# Patient Record
Sex: Male | Born: 1965
Health system: Southern US, Community
[De-identification: ages and names within clinical notes are randomized; demographics above are authoritative.]

## PROBLEM LIST (undated history)

## (undated) DIAGNOSIS — E785 Hyperlipidemia, unspecified: Secondary | ICD-10-CM

## (undated) DIAGNOSIS — E119 Type 2 diabetes mellitus without complications: Secondary | ICD-10-CM

## (undated) DIAGNOSIS — I10 Essential (primary) hypertension: Secondary | ICD-10-CM

## (undated) DIAGNOSIS — E1169 Type 2 diabetes mellitus with other specified complication: Secondary | ICD-10-CM

## (undated) HISTORY — PX: FOOT SURGERY: SHX648

## (undated) HISTORY — PX: COLONOSCOPY: SHX174

## (undated) HISTORY — PX: OTHER SURGICAL HISTORY: SHX169

---

## 1999-10-31 ENCOUNTER — Emergency Department (HOSPITAL_COMMUNITY): Admission: EM | Admit: 1999-10-31 | Discharge: 1999-10-31 | Payer: Self-pay | Admitting: Internal Medicine

## 2004-04-18 ENCOUNTER — Emergency Department (HOSPITAL_COMMUNITY): Admission: EM | Admit: 2004-04-18 | Discharge: 2004-04-18 | Payer: Self-pay | Admitting: Emergency Medicine

## 2004-04-24 ENCOUNTER — Ambulatory Visit: Payer: Self-pay | Admitting: Nurse Practitioner

## 2004-05-09 ENCOUNTER — Ambulatory Visit: Payer: Self-pay | Admitting: Nurse Practitioner

## 2004-05-16 ENCOUNTER — Ambulatory Visit: Payer: Self-pay | Admitting: Nurse Practitioner

## 2004-07-13 ENCOUNTER — Ambulatory Visit: Payer: Self-pay | Admitting: *Deleted

## 2004-11-15 ENCOUNTER — Ambulatory Visit: Payer: Self-pay | Admitting: Nurse Practitioner

## 2005-07-01 ENCOUNTER — Ambulatory Visit: Payer: Self-pay | Admitting: Nurse Practitioner

## 2005-11-04 ENCOUNTER — Ambulatory Visit: Payer: Self-pay | Admitting: Nurse Practitioner

## 2006-01-30 ENCOUNTER — Ambulatory Visit: Payer: Self-pay | Admitting: Nurse Practitioner

## 2006-02-21 ENCOUNTER — Ambulatory Visit: Payer: Self-pay | Admitting: Nurse Practitioner

## 2006-04-03 ENCOUNTER — Ambulatory Visit: Payer: Self-pay | Admitting: Nurse Practitioner

## 2006-04-24 ENCOUNTER — Ambulatory Visit (HOSPITAL_COMMUNITY): Admission: RE | Admit: 2006-04-24 | Discharge: 2006-04-24 | Payer: Self-pay | Admitting: Family Medicine

## 2006-04-24 ENCOUNTER — Encounter (INDEPENDENT_AMBULATORY_CARE_PROVIDER_SITE_OTHER): Payer: Self-pay | Admitting: Cardiology

## 2006-05-27 ENCOUNTER — Ambulatory Visit: Payer: Self-pay | Admitting: Nurse Practitioner

## 2006-10-07 ENCOUNTER — Ambulatory Visit: Payer: Self-pay | Admitting: Internal Medicine

## 2006-10-10 DIAGNOSIS — E669 Obesity, unspecified: Secondary | ICD-10-CM

## 2006-10-13 DIAGNOSIS — E1169 Type 2 diabetes mellitus with other specified complication: Secondary | ICD-10-CM | POA: Insufficient documentation

## 2006-10-13 DIAGNOSIS — I10 Essential (primary) hypertension: Secondary | ICD-10-CM | POA: Insufficient documentation

## 2006-10-13 DIAGNOSIS — E785 Hyperlipidemia, unspecified: Secondary | ICD-10-CM

## 2006-10-13 DIAGNOSIS — E119 Type 2 diabetes mellitus without complications: Secondary | ICD-10-CM | POA: Insufficient documentation

## 2006-11-05 ENCOUNTER — Encounter (INDEPENDENT_AMBULATORY_CARE_PROVIDER_SITE_OTHER): Payer: Self-pay | Admitting: *Deleted

## 2006-11-11 ENCOUNTER — Ambulatory Visit: Payer: Self-pay | Admitting: Internal Medicine

## 2006-11-12 ENCOUNTER — Ambulatory Visit (HOSPITAL_COMMUNITY): Admission: RE | Admit: 2006-11-12 | Discharge: 2006-11-12 | Payer: Self-pay | Admitting: Family Medicine

## 2006-11-14 ENCOUNTER — Ambulatory Visit: Payer: Self-pay | Admitting: Family Medicine

## 2006-12-10 ENCOUNTER — Ambulatory Visit: Payer: Self-pay | Admitting: Family Medicine

## 2007-01-27 ENCOUNTER — Ambulatory Visit: Payer: Self-pay | Admitting: Family Medicine

## 2007-01-27 ENCOUNTER — Encounter (INDEPENDENT_AMBULATORY_CARE_PROVIDER_SITE_OTHER): Payer: Self-pay | Admitting: Nurse Practitioner

## 2007-01-27 LAB — CONVERTED CEMR LAB: GC Probe Amp, Urine: POSITIVE — AB

## 2007-03-11 ENCOUNTER — Ambulatory Visit: Payer: Self-pay | Admitting: Internal Medicine

## 2008-01-11 ENCOUNTER — Encounter (INDEPENDENT_AMBULATORY_CARE_PROVIDER_SITE_OTHER): Payer: Self-pay | Admitting: Internal Medicine

## 2008-01-11 ENCOUNTER — Ambulatory Visit: Payer: Self-pay | Admitting: Internal Medicine

## 2008-01-11 LAB — CONVERTED CEMR LAB
ALT: 18 units/L (ref 0–53)
Alkaline Phosphatase: 37 units/L — ABNORMAL LOW (ref 39–117)
BUN: 10 mg/dL (ref 6–23)
CO2: 22 meq/L (ref 19–32)
Chloride: 99 meq/L (ref 96–112)
Creatinine, Ser: 0.87 mg/dL (ref 0.40–1.50)
Glucose, Bld: 122 mg/dL — ABNORMAL HIGH (ref 70–99)
Sodium: 138 meq/L (ref 135–145)
Total Protein: 7.7 g/dL (ref 6.0–8.3)

## 2008-05-02 ENCOUNTER — Ambulatory Visit: Payer: Self-pay | Admitting: Internal Medicine

## 2008-05-02 ENCOUNTER — Encounter (INDEPENDENT_AMBULATORY_CARE_PROVIDER_SITE_OTHER): Payer: Self-pay | Admitting: Internal Medicine

## 2008-05-02 LAB — CONVERTED CEMR LAB
ALT: 24 units/L (ref 0–53)
BUN: 10 mg/dL (ref 6–23)
CO2: 16 meq/L — ABNORMAL LOW (ref 19–32)
Cholesterol: 252 mg/dL — ABNORMAL HIGH (ref 0–200)
Creatinine, Ser: 1 mg/dL (ref 0.40–1.50)
Microalb, Ur: 13.22 mg/dL — ABNORMAL HIGH (ref 0.00–1.89)
Potassium: 4.7 meq/L (ref 3.5–5.3)
Total Bilirubin: 1.1 mg/dL (ref 0.3–1.2)
Total CHOL/HDL Ratio: 4.4
Total Protein: 7.8 g/dL (ref 6.0–8.3)
Triglycerides: 99 mg/dL (ref <150)
VLDL: 20 mg/dL (ref 0–40)

## 2008-08-01 ENCOUNTER — Ambulatory Visit: Payer: Self-pay | Admitting: Internal Medicine

## 2009-01-24 ENCOUNTER — Encounter (INDEPENDENT_AMBULATORY_CARE_PROVIDER_SITE_OTHER): Payer: Self-pay | Admitting: Internal Medicine

## 2009-01-24 ENCOUNTER — Ambulatory Visit: Payer: Self-pay | Admitting: Family Medicine

## 2009-01-24 LAB — CONVERTED CEMR LAB
AST: 31 units/L (ref 0–37)
Albumin: 4.3 g/dL (ref 3.5–5.2)
CO2: 23 meq/L (ref 19–32)
Calcium: 9.2 mg/dL (ref 8.4–10.5)
Chloride: 107 meq/L (ref 96–112)
Creatinine, Ser: 0.74 mg/dL (ref 0.40–1.50)
HDL: 64 mg/dL (ref >39)
PSA: 0.8 ng/mL (ref 0.10–4.00)
Potassium: 4.1 meq/L (ref 3.5–5.3)
Sodium: 144 meq/L (ref 135–145)
Total CHOL/HDL Ratio: 2.8
VLDL: 7 mg/dL (ref 0–40)

## 2009-11-13 ENCOUNTER — Encounter (INDEPENDENT_AMBULATORY_CARE_PROVIDER_SITE_OTHER): Payer: Self-pay | Admitting: *Deleted

## 2009-11-13 LAB — CONVERTED CEMR LAB
AST: 25 units/L (ref 0–37)
Alkaline Phosphatase: 35 units/L — ABNORMAL LOW (ref 39–117)
CO2: 21 meq/L (ref 19–32)
Chloride: 111 meq/L (ref 96–112)
Cholesterol: 207 mg/dL — ABNORMAL HIGH (ref 0–200)
Creatinine, Ser: 0.74 mg/dL (ref 0.40–1.50)
Glucose, Bld: 137 mg/dL — ABNORMAL HIGH (ref 70–99)
Total Bilirubin: 0.4 mg/dL (ref 0.3–1.2)
Total CHOL/HDL Ratio: 4.3
Total Protein: 7 g/dL (ref 6.0–8.3)

## 2013-11-25 ENCOUNTER — Encounter (HOSPITAL_COMMUNITY): Payer: Self-pay | Admitting: Emergency Medicine

## 2013-11-25 ENCOUNTER — Emergency Department (HOSPITAL_COMMUNITY): Payer: Self-pay

## 2013-11-25 ENCOUNTER — Emergency Department (HOSPITAL_COMMUNITY)
Admission: EM | Admit: 2013-11-25 | Discharge: 2013-11-25 | Disposition: A | Discharge status: Home / Self Care | Payer: Self-pay | Attending: Emergency Medicine | Admitting: Emergency Medicine

## 2013-11-25 ENCOUNTER — Other Ambulatory Visit: Payer: Self-pay | Admitting: Physician Assistant

## 2013-11-25 DIAGNOSIS — R61 Generalized hyperhidrosis: Secondary | ICD-10-CM | POA: Insufficient documentation

## 2013-11-25 DIAGNOSIS — I2089 Other forms of angina pectoris: Secondary | ICD-10-CM | POA: Diagnosis present

## 2013-11-25 DIAGNOSIS — E119 Type 2 diabetes mellitus without complications: Secondary | ICD-10-CM

## 2013-11-25 DIAGNOSIS — Z720 Tobacco use: Secondary | ICD-10-CM | POA: Insufficient documentation

## 2013-11-25 DIAGNOSIS — Z7982 Long term (current) use of aspirin: Secondary | ICD-10-CM | POA: Insufficient documentation

## 2013-11-25 DIAGNOSIS — R0602 Shortness of breath: Secondary | ICD-10-CM | POA: Insufficient documentation

## 2013-11-25 DIAGNOSIS — I208 Other forms of angina pectoris: Secondary | ICD-10-CM

## 2013-11-25 DIAGNOSIS — R079 Chest pain, unspecified: Secondary | ICD-10-CM | POA: Insufficient documentation

## 2013-11-25 DIAGNOSIS — I10 Essential (primary) hypertension: Secondary | ICD-10-CM | POA: Diagnosis present

## 2013-11-25 DIAGNOSIS — L02811 Cutaneous abscess of head [any part, except face]: Secondary | ICD-10-CM | POA: Insufficient documentation

## 2013-11-25 HISTORY — DX: Essential (primary) hypertension: I10

## 2013-11-25 HISTORY — DX: Type 2 diabetes mellitus with other specified complication: E11.69

## 2013-11-25 HISTORY — DX: Type 2 diabetes mellitus with other specified complication: E78.5

## 2013-11-25 HISTORY — DX: Type 2 diabetes mellitus without complications: E11.9

## 2013-11-25 LAB — I-STAT TROPONIN, ED
TROPONIN I, POC: 0 ng/mL (ref 0.00–0.08)
TROPONIN I, POC: 0 ng/mL (ref 0.00–0.08)

## 2013-11-25 LAB — BASIC METABOLIC PANEL
Anion gap: 14 (ref 5–15)
BUN: 7 mg/dL (ref 6–23)
CHLORIDE: 98 meq/L (ref 96–112)
CO2: 24 mEq/L (ref 19–32)
CREATININE: 0.77 mg/dL (ref 0.50–1.35)
Calcium: 9.4 mg/dL (ref 8.4–10.5)
GFR calc Af Amer: >90 mL/min (ref >90)
GFR calc non Af Amer: >90 mL/min (ref >90)
Glucose, Bld: 285 mg/dL — ABNORMAL HIGH (ref 70–99)
POTASSIUM: 4.2 meq/L (ref 3.7–5.3)
SODIUM: 136 meq/L — AB (ref 137–147)

## 2013-11-25 LAB — PROTIME-INR
INR: 1.05 (ref 0.00–1.49)
Prothrombin Time: 13.7 seconds (ref 11.6–15.2)

## 2013-11-25 LAB — HEPATIC FUNCTION PANEL
ALK PHOS: 55 U/L (ref 39–117)
ALT: 14 U/L (ref 0–53)
AST: 18 U/L (ref 0–37)
Albumin: 3.4 g/dL — ABNORMAL LOW (ref 3.5–5.2)
Bilirubin, Direct: <0.2 mg/dL (ref 0.0–0.3)
TOTAL PROTEIN: 8 g/dL (ref 6.0–8.3)
Total Bilirubin: 0.3 mg/dL (ref 0.3–1.2)

## 2013-11-25 LAB — LIPID PANEL
Cholesterol: 243 mg/dL — ABNORMAL HIGH (ref 0–200)
HDL: 34 mg/dL — AB (ref >39)
LDL Cholesterol: 189 mg/dL — ABNORMAL HIGH (ref 0–99)
Total CHOL/HDL Ratio: 7.1 RATIO
Triglycerides: 101 mg/dL (ref <150)
VLDL: 20 mg/dL (ref 0–40)

## 2013-11-25 LAB — CBC
HCT: 46.2 % (ref 39.0–52.0)
HEMOGLOBIN: 16.8 g/dL (ref 13.0–17.0)
MCH: 28.5 pg (ref 26.0–34.0)
MCHC: 36.4 g/dL — ABNORMAL HIGH (ref 30.0–36.0)
MCV: 78.4 fL (ref 78.0–100.0)
Platelets: 223 10*3/uL (ref 150–400)
RBC: 5.89 MIL/uL — ABNORMAL HIGH (ref 4.22–5.81)
RDW: 12.8 % (ref 11.5–15.5)
WBC: 6.4 10*3/uL (ref 4.0–10.5)

## 2013-11-25 LAB — APTT: aPTT: 28 seconds (ref 24–37)

## 2013-11-25 LAB — TSH: TSH: 1.07 u[IU]/mL (ref 0.350–4.500)

## 2013-11-25 LAB — CHOLESTEROL, TOTAL: CHOLESTEROL: 236 mg/dL — AB (ref 0–200)

## 2013-11-25 MED ORDER — METOPROLOL TARTRATE 25 MG PO TABS: 25.0000 mg | ORAL_TABLET | Freq: Two times a day (BID) | ORAL | Status: DC

## 2013-11-25 MED ORDER — NITROGLYCERIN 0.4 MG SL SUBL
0.4000 mg | SUBLINGUAL_TABLET | SUBLINGUAL | Status: DC | PRN
  Administered 2013-11-25: 0.4 mg via SUBLINGUAL
  Filled 2013-11-25 (×2): qty 1

## 2013-11-25 MED ORDER — LIDOCAINE HCL (PF) 1 % IJ SOLN
10.0000 mL | Freq: Once | INTRAMUSCULAR | Status: AC
  Administered 2013-11-25: 10 mL via INTRADERMAL
  Filled 2013-11-25: qty 10

## 2013-11-25 MED ORDER — SULFAMETHOXAZOLE-TMP DS 800-160 MG PO TABS
1.0000 | ORAL_TABLET | Freq: Once | ORAL | Status: AC
  Administered 2013-11-25: 1 via ORAL
  Filled 2013-11-25: qty 1

## 2013-11-25 MED ORDER — NITROGLYCERIN 0.4 MG SL SUBL: 0.4000 mg | SUBLINGUAL_TABLET | SUBLINGUAL | Status: DC | PRN

## 2013-11-25 MED ORDER — CEPHALEXIN 250 MG PO CAPS: 500.0000 mg | ORAL_CAPSULE | Freq: Four times a day (QID) | ORAL | Status: AC

## 2013-11-25 MED ORDER — SULFAMETHOXAZOLE-TRIMETHOPRIM 800-160 MG PO TABS: 1.0000 | ORAL_TABLET | Freq: Two times a day (BID) | ORAL | Status: AC

## 2013-11-25 MED ORDER — LISINOPRIL 5 MG PO TABS: 5.0000 mg | ORAL_TABLET | Freq: Every day | ORAL | Status: DC

## 2013-11-25 MED ORDER — ASPIRIN 325 MG PO TABS
325.0000 mg | ORAL_TABLET | ORAL | Status: AC
  Administered 2013-11-25: 325 mg via ORAL
  Filled 2013-11-25: qty 1

## 2013-11-25 MED ORDER — CEPHALEXIN 250 MG PO CAPS
500.0000 mg | ORAL_CAPSULE | Freq: Once | ORAL | Status: AC
  Administered 2013-11-25: 500 mg via ORAL
  Filled 2013-11-25: qty 2

## 2013-11-25 MED ORDER — ASPIRIN EC 81 MG PO TBEC: 81.0000 mg | DELAYED_RELEASE_TABLET | Freq: Every day | ORAL | Status: DC

## 2013-11-25 NOTE — ED Notes (Signed)
Dr. Kathi Simpers back at the bedside.

## 2013-11-25 NOTE — Consult Note (Signed)
ER CONSULT NOTE   Patient ID: Bruce Cabrera MRN: [id number], DOB/AGE: August 02, 1965 48 y.o. Date of Encounter: 11/25/2013  Primary Physician: No PCP Per Patient Primary Cardiologist: New  Chief Complaint:  Chest pain  HPI: Bruce Cabrera is a 48 y.o. male with no history of CAD. He has multiple cardiac risk factors. At about 11:30 pm yesterday, he had onset of substernal chest pain, that was associated with diaphoresis and SOB. It lasted over an hour, finally resolving. He did not try any medications. He was able to sleep. This am, he had the same symptoms. He was concerned about them and came to the hospital. He was given SL NTG x 1 and ASA 325 mg. His pain was initially a 4/10, but has now resolved. He is currently pain-free.   He also was noted to have 3 scalp abscesses, treated with I&D in the ER.   He has never had symptoms like this before. He does not exercise, but has never had exertional chest pain. He has had no bleeding issues and has had no recent illnesses or injuries. He was diagnosed with diabetes several years ago, and has lost over 100 lbs since then, keeping it off for over 6 years.  Past Medical History  Diagnosis Date  . Diabetes mellitus without complication   . Hypertension   . Hyperlipidemia associated with type 2 diabetes mellitus     based on 2011 profile    Surgical History:  Past Surgical History  Procedure Laterality Date  . None       I have reviewed the patient's current medications. Prior to Admission medications   Medication Sig Start Date End Date Taking? Authorizing Provider  aspirin EC 81 MG tablet Take 81 mg by mouth daily.   Yes Historical Provider, MD   PRN Meds:.nitroGLYCERIN  Allergies: No Known Allergies  History   Social History  . Marital Status: Single    Spouse Name: N/A    Number of Children: N/A  . Years of Education: N/A   Occupational History  . 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 Smoking status:  Current Every Day Smoker -- 0.50 packs/day for 30 years    Types: Cigarettes  . Smokeless tobacco: Never Used  . Alcohol Use: Yes     Comment: 2- tallboys a day  . Drug Use: No  . Sexual Activity: Not on file   Other Topics Concern  . Not on file   Social History Narrative   Lives with roommate    Family History  Problem Relation Age of Onset  . Heart attack Mother 48  . Heart failure Brother 71   Family Status  Relation Status Death Age  . Mother Deceased 50  . Father Deceased     no info  . Brother Alive     Review of Systems:   Full 14-point review of systems otherwise negative except as noted above.  Physical Exam: Blood pressure 142/95, pulse 88, temperature 98.6 F (37 C), temperature source Oral, resp. rate 20, SpO2 99.00%. General: Well developed, well nourished,male in no acute distress. Head: Normocephalic, atraumatic, sclera non-icteric, no xanthomas, nares are without discharge. Dentition: poor Neck: No carotid bruits. JVD not elevated. No thyromegally Lungs: Good expansion bilaterally. without wheezes or rhonchi.  Heart: Regular rate and rhythm with S1 S2.  No S3 or S4.  No murmur, no rubs, or gallops appreciated. Abdomen: Soft, non-tender, non-distended with normoactive bowel  sounds. No hepatomegaly. No rebound/guarding. No obvious abdominal masses. Msk:  Strength and tone appear normal for age. No joint deformities or effusions, no spine or costo-vertebral angle tenderness. Extremities: No clubbing or cyanosis. No edema.  Distal pedal pulses are 2+ in 4 extrem Neuro: Alert and oriented X 3. Moves all extremities spontaneously. No focal deficits noted. Psych:  Responds to questions appropriately with a normal affect. Skin: No rashes noted. Lesions, s/p I&D, hemorrhage controlled.  Labs:   Lab Results  Component Value Date   WBC 6.4 11/25/2013   HGB 16.8 11/25/2013   HCT 46.2 11/25/2013   MCV 78.4 11/25/2013   PLT 223 11/25/2013     Recent Labs Lab  11/25/13 1253  NA 136*  K 4.2  CL 98  CO2 24  BUN 7  CREATININE 0.77  CALCIUM 9.4  GLUCOSE 285*    Recent Labs  11/25/13 1259 11/25/13 1623  TROPIPOC 0.00 0.00   Lab Results  Component Value Date   CHOL 207* 11/13/2009   HDL 48 11/13/2009   LDLCALC 141* 11/13/2009   TRIG 90 11/13/2009   Radiology/Studies: Dg Chest 2 View 11/25/2013   CLINICAL DATA:  Cough.  Left chest pain.  Initial evaluation.  EXAM: CHEST  2 VIEW  COMPARISON:  04/18/2004.  FINDINGS: Mediastinum hilar structures normal. Mild right base subsegmental atelectasis versus infiltrate. No pleural effusion or pneumothorax. Heart size normal. No acute bony abnormality  IMPRESSION: Mild right base subsegmental atelectasis and or infiltrate.   Electronically Signed   By: Marcello Moores  Register   On: 11/25/2013 13:28   ECG: 11/25/2013 Sinus tachycardia Possible Anterolateral infarct , age undetermined Abnormal ECG 30mm/s 21mm/mV 100Hz  8.0 SP2 12SL 241 CID: 61 Referred by: Unconfirmed Vent. rate 101 BPM PR interval 178 ms QRS duration 98 ms QT/QTc 348/451 ms P-R-T axes 53 270 40   ASSESSMENT AND PLAN:  Principal Problem:   Anginal chest pain at rest - Mr. Bruce Cabrera has multiple cardiac risk factors, not well controlled. He has had no medications in over a year. He has no insurance and does not get routine medical care. His symptoms are concerning for unstable anginal pain, and his ECG is abnormal. Fortunately, his troponin is negative so far.  See below  Active Problems:   Essential hypertension   Signed, Rosaria Ferries, PA-C 11/25/2013 4:40 PM Beeper [phone]    I have personally seen and examined this patient with Rosaria Ferries, PA-C. I agree with the assessment and plan as outlined above. My plan is as follows:   1. Chest pain concerning for unstable angina: He has multiple risk factors for CAD including HTN, HLD, DM, obesity, ongoing tobacco abuse and FH of premature CAD. He is high risk for obstructive CAD. His  clinical presentation is c/w unstable angina. We had planned on admission with cath tomorrow but he is refusing to be admitted. He is leaving the ED against medical advice today. His blood sugar is uncontrolled. His BP is uncontrolled. I have had a long discussion regarding his risk of death if he indeed is having unstable angina and does not accept our plan for admission. He is not willing to hear this. He states that he must go home to let his dogs out. His mental status is clear and it is clear that he is capable of making his own medical decisions. Will start Lopressor 25 mg po BID, Lisinopril 5 mg Qdaily and give him NTG to use prn for pain. He is instructed to come back  to the ED if he has recurrence of chest pain. We have placed him on the cath list for an outpatient cath on Monday 11/29/13 with Dr. Claiborne Billings.   2. HTN: BP uncontrolled. Will start Lopressor and Lisinopril as above. He has been out of all meds since [facility] closed.   3. DM: Blood sugar elevated today. He will need to be restarted on his diabetic medications. This can be arranged post cath. He needs to be plugged into a free medical clinic for primary care.   MCALHANY,CHRISTOPHER 11/25/2013\ 5:12 PM

## 2013-11-25 NOTE — ED Notes (Signed)
cardiology at bedside

## 2013-11-25 NOTE — ED Notes (Signed)
Pt states last night he was sitting with friends laughing began having substernal chest pain, described as squeezing, became SOB diaphoretic. Pain lasted 1.5 hours went away on its own and he went to bed. Pt states pain returned this AM when he woke. Similar symptoms.

## 2013-11-25 NOTE — ED Provider Notes (Signed)
CSN: [id number]     Arrival date & time 11/25/13  1216 History   First MD Initiated Contact with Patient 11/25/13 1230     Chief Complaint  Patient presents with  . Chest Pain   Patient is a 48 y.o. male presenting with chest pain. The history is provided by the patient.  Chest Pain Pain location:  L chest Pain quality: pressure   Pain radiates to:  Does not radiate Pain radiates to the back: no   Pain severity:  Mild Onset quality:  Sudden Timing:  Constant Chronicity:  New Context: at rest   Relieved by: resolves spontaneously. Worsened by:  Nothing tried Ineffective treatments:  None tried Associated symptoms: diaphoresis and shortness of breath   Associated symptoms: no abdominal pain, no back pain, no cough, no fever, no headache, no heartburn, no nausea and not vomiting   Risk factors: diabetes mellitus and hypertension    Patient is a 48 year old American male with a history of untreated hypertension diabetes who presents with chest pain. Onset was last night while he was with friends. The pain went away after approximately an hour and a half. Patient reports that his pain recurred this morning while he was at rest. He reports diaphoresis and shortness of breath. Shortness of breath is worse with ambulation and improved with rest. Patient does not have a PCP. Patient states his mom died at age <26 of an MI.  Patient rates his pain currently at 4/10 and it does not radiate. Patient denies eating spicy foods. He also reports 3 large painful bumps on the back of his head that have been there for a few weeks. He denies fevers/chills/night sweats.  Past Medical History  Diagnosis Date  . Diabetes mellitus without complication   . Hypertension    History reviewed. No pertinent past surgical history. History reviewed. No pertinent family history. History  Substance Use Topics  . Smoking status: Current Every Day Smoker  . Smokeless tobacco: Not on file  . Alcohol Use: Yes     Review of Systems  Constitutional: Positive for diaphoresis. Negative for fever and chills.  HENT: Negative for rhinorrhea and sore throat.   Eyes: Negative for visual disturbance.  Respiratory: Positive for shortness of breath. Negative for cough.   Cardiovascular: Positive for chest pain.  Gastrointestinal: Negative for heartburn, nausea, vomiting, abdominal pain, diarrhea and constipation.  Genitourinary: Negative for dysuria and hematuria.  Musculoskeletal: Negative for back pain and neck pain.  Skin: Negative for rash.  Neurological: Negative for syncope and headaches.  Psychiatric/Behavioral: Negative for confusion.  All other systems reviewed and are negative.  Allergies  Review of patient's allergies indicates no known allergies.  Home Medications   Prior to Admission medications   Medication Sig Start Date End Date Taking? Authorizing Provider  aspirin EC 81 MG tablet Take 81 mg by mouth daily.   Yes Historical Provider, MD   BP 123/77  Pulse 105  Temp(Src) 98.6 F (37 C) (Oral)  Resp 18  SpO2 97% Physical Exam  Constitutional: He is oriented to person, place, and time. He appears well-developed and well-nourished. No distress.  HENT:  Head: Normocephalic and atraumatic.    Mouth/Throat: Oropharynx is clear and moist.  3 indurated, tender raised lesions on behind L ear and on posterior scalp  Eyes: EOM are normal.  Neck: Neck supple. No JVD present.  Cardiovascular: Normal rate, regular rhythm, normal heart sounds and intact distal pulses.   Pulmonary/Chest: Effort normal and breath sounds normal.  Abdominal: Soft. He exhibits no distension. There is no tenderness.  Musculoskeletal: Normal range of motion. He exhibits no edema.  Neurological: He is alert and oriented to person, place, and time. No cranial nerve deficit.  Skin: Skin is warm and dry.  Psychiatric: His behavior is normal.    ED Course  INCISION AND DRAINAGE Date/Time: 11/25/2013 2:35  PM Performed by: Gustavus Bryant Authorized by: Gustavus Bryant Consent: Verbal consent obtained. Risks and benefits: risks, benefits and alternatives were discussed Patient identity confirmed: verbally with patient and hospital-assigned identification number Type: abscess Body area: head/neck Location details: scalp Local anesthetic: lidocaine 1% without epinephrine Patient sedated: no Scalpel size: 11 Needle gauge: 18 Drainage: purulent Drainage amount: moderate Wound treatment: wound left open Patient tolerance: Patient tolerated the procedure well with no immediate complications.     Labs Review Labs Reviewed  BASIC METABOLIC PANEL - Abnormal; Notable for the following:    Sodium 136 (*)    Glucose, Bld 285 (*)    All other components within normal limits  CBC - Abnormal; Notable for the following:    RBC 5.89 (*)    MCHC 36.4 (*)    All other components within normal limits  I-STAT TROPOININ, ED    Imaging Review Dg Chest 2 View  11/25/2013   CLINICAL DATA:  Cough.  Left chest pain.  Initial evaluation.  EXAM: CHEST  2 VIEW  COMPARISON:  04/18/2004.  FINDINGS: Mediastinum hilar structures normal. Mild right base subsegmental atelectasis versus infiltrate. No pleural effusion or pneumothorax. Heart size normal. No acute bony abnormality  IMPRESSION: Mild right base subsegmental atelectasis and or infiltrate.   Electronically Signed   By: Marcello Moores  Register   On: 11/25/2013 13:28     EKG Interpretation   Date/Time:  Thursday November 25 2013 12:22:29 EDT Ventricular Rate:  101 PR Interval:  178 QRS Duration: 98 QT Interval:  348 QTC Calculation: 451 R Axis:   -90 Text Interpretation:  Sinus tachycardia Possible Anterolateral infarct ,  age undetermined Abnormal ECG No significant change was found Confirmed by  Debby Freiberg [id number]) on 11/25/2013 1:05:22 PM     MDM   Final diagnoses:  Essential hypertension  Chest pain, unspecified chest pain type  Abscess of  scalp    48 year old [place] American male with hypertension and diabetes presents with a left-sided chest pain with diaphoresis and shortness of breath. Patient is well appearing on exam. His cardiac and pulmonary exams are unremarkable. Patient given sublingual nitroglycerin and aspirin and reports pain relief from 4/10 total 1/10. Troponin is negative. HEART score 3, so he is LOW risk for ACS. CXR unremarkable. Patient also has abscess on his posterior scalp that were I&D'd at the bedside as detailed above. Patient voiced immediate relief. Patient states he is currently chest pain-free. Will consult cardiology for further recommendations. Cardiology has evaluated the patient and feels that a cardiac cath would be indicated. However, the patient would rather be discharged and have an outpatient appointment and had this procedure scheduled at a later date. He states that he has a dog at home and that he needs to return to work to pay his bills. Cardiology has arranged follow up. Risk stratification labs sent (TSH, A1C, cholesterol and lipid panels). Will Rx clinda and bactrim for abscesses. Pt also Rx ASA, lisinopril, lopressor and NTG by Cards. Stable at discharge.   Case discussed with Dr. Colin Rhein.   Gustavus Bryant, MD 11/25/13 [phone]

## 2013-11-25 NOTE — ED Notes (Signed)
Pt transported to xray 

## 2013-11-25 NOTE — Discharge Instructions (Signed)
Coronary Angiogram A coronary angiogram, also called coronary angiography, is an X-ray procedure used to look at the arteries in the heart. In this procedure, a dye (contrast dye) is injected through a long, hollow tube (catheter). The catheter is about the size of a piece of cooked spaghetti and is inserted through your groin, wrist, or arm. The dye is injected into each artery, and X-rays are then taken to show if there is a blockage in the arteries of your heart. LET [facility] CARE PROVIDER KNOW ABOUT:  Any allergies you have, including allergies to shellfish or contrast dye.   All medicines you are taking, including vitamins, herbs, eye drops, creams, and over-the-counter medicines.   Previous problems you or members of your family have had with the use of anesthetics.   Any blood disorders you have.   Previous surgeries you have had.  History of kidney problems or failure.   Other medical conditions you have. RISKS AND COMPLICATIONS  Generally, a coronary angiogram is a safe procedure. However, problems can occur and include:  Allergic reaction to the dye.  Bleeding from the access site or other locations.  Kidney injury, especially in people with impaired kidney function.  Stroke (rare).  Heart attack (rare). BEFORE THE PROCEDURE   Do not eat or drink anything after midnight the night before the procedure or as directed by your health care provider.   Ask your health care provider about changing or stopping your regular medicines. This is especially important if you are taking diabetes medicines or blood thinners. PROCEDURE  You may be given a medicine to help you relax (sedative) before the procedure. This medicine is given through an intravenous (IV) access tube that is inserted into one of your veins.   The area where the catheter will be inserted will be washed and shaved. This is usually done in the groin but may be done in the fold of your arm (near your  elbow) or in the wrist.   A medicine will be given to numb the area where the catheter will be inserted (local anesthetic).   The health care provider will insert the catheter into an artery. The catheter will be guided by using a special type of X-ray (fluoroscopy) of the blood vessel being examined.   A special dye will then be injected into the catheter, and X-rays will be taken. The dye will help to show where any narrowing or blockages are located in the heart arteries.  AFTER THE PROCEDURE   If the procedure is done through the leg, you will be kept in bed lying flat for several hours. You will be instructed to not bend or cross your legs.  The insertion site will be checked frequently.   The pulse in your feet or wrist will be checked frequently.   Additional blood tests, X-rays, and an electrocardiogram may be done.  Document Released: 08/11/2002 Document Revised: 06/21/2013 Document Reviewed: 06/29/2012 [facility] Patient Information 2015 [place], [place]. This information is not intended to replace advice given to you by your health care provider. Make sure you discuss any questions you have with your health care provider.  Abscess An abscess is an infected area that contains a collection of pus and debris.It can occur in almost any part of the body. An abscess is also known as a furuncle or boil. CAUSES  An abscess occurs when tissue gets infected. This can occur from blockage of oil or sweat glands, infection of hair follicles, or a minor  injury to the skin. As the body tries to fight the infection, pus collects in the area and creates pressure under the skin. This pressure causes pain. People with weakened immune systems have difficulty fighting infections and get certain abscesses more often.  SYMPTOMS Usually an abscess develops on the skin and becomes a painful mass that is red, warm, and tender. If the abscess forms under the skin, you may feel a moveable soft area  under the skin. Some abscesses break open (rupture) on their own, but most will continue to get worse without care. The infection can spread deeper into the body and eventually into the bloodstream, causing you to feel ill.  DIAGNOSIS  Your caregiver will take your medical history and perform a physical exam. A sample of fluid may also be taken from the abscess to determine what is causing your infection. TREATMENT  Your caregiver may prescribe antibiotic medicines to fight the infection. However, taking antibiotics alone usually does not cure an abscess. Your caregiver may need to make a small cut (incision) in the abscess to drain the pus. In some cases, gauze is packed into the abscess to reduce pain and to continue draining the area. HOME CARE INSTRUCTIONS   Only take over-the-counter or prescription medicines for pain, discomfort, or fever as directed by your caregiver.  If you were prescribed antibiotics, take them as directed. Finish them even if you start to feel better.  If gauze is used, follow your caregiver's directions for changing the gauze.  To avoid spreading the infection:  Keep your draining abscess covered with a bandage.  Wash your hands well.  Do not share personal care items, towels, or whirlpools with others.  Avoid skin contact with others.  Keep your skin and clothes clean around the abscess.  Keep all follow-up appointments as directed by your caregiver. SEEK MEDICAL CARE IF:   You have increased pain, swelling, redness, fluid drainage, or bleeding.  You have muscle aches, chills, or a general ill feeling.  You have a fever. MAKE SURE YOU:   Understand these instructions.  Will watch your condition.  Will get help right away if you are not doing well or get worse. Document Released: 11/14/2004 Document Revised: 08/06/2011 Document Reviewed: 04/19/2011 [facility] Patient Information 2015 [place], [place]. This information is not intended to replace  advice given to you by your health care provider. Make sure you discuss any questions you have with your health care provider.

## 2013-11-26 LAB — HEMOGLOBIN A1C
Hgb A1c MFr Bld: 12.5 % — ABNORMAL HIGH (ref <5.7)
MEAN PLASMA GLUCOSE: 312 mg/dL — AB (ref <117)

## 2013-11-26 NOTE — ED Provider Notes (Signed)
I saw and evaluated the patient, reviewed the resident's note and I agree with the findings and plan.   EKG Interpretation   Date/Time:  Thursday November 25 2013 12:22:29 EDT Ventricular Rate:  101 PR Interval:  178 QRS Duration: 98 QT Interval:  348 QTC Calculation: 451 R Axis:   -90 Text Interpretation:  Sinus tachycardia Possible Anterolateral infarct ,  age undetermined Abnormal ECG No significant change was found Confirmed by  Debby Freiberg [phone]) on 11/25/2013 1:05:22 PM       Briefly, pt is a 48 y.o. male presenting with chest pain, concerning for possible acs, as well as swelling to occiput.  I performed an examination on the patient including cardiac, pulmonary, and gi systems which were unremarkable.  Abscesses drained by resident.  Pt was placed on bactrim and KEFLEX (not clinda, this was a transcription error).  We spoke with cardiology and were to admit the pt, but he insisted on leaving.  He was dc home in stable condition to fu with cards within 72 hours.       Debby Freiberg, MD 11/26/13 [phone]

## 2013-11-29 ENCOUNTER — Encounter (HOSPITAL_COMMUNITY)
Admission: RE | Disposition: A | Discharge status: Home / Self Care | Payer: Self-pay | Source: Ambulatory Visit | Attending: Cardiovascular Disease

## 2013-11-29 ENCOUNTER — Ambulatory Visit (HOSPITAL_COMMUNITY)
Admission: RE | Admit: 2013-11-29 | Discharge: 2013-11-29 | Disposition: A | Discharge status: Home / Self Care | Payer: Self-pay | Source: Ambulatory Visit | Attending: Cardiovascular Disease | Admitting: Cardiovascular Disease

## 2013-11-29 DIAGNOSIS — E118 Type 2 diabetes mellitus with unspecified complications: Secondary | ICD-10-CM | POA: Insufficient documentation

## 2013-11-29 DIAGNOSIS — I2510 Atherosclerotic heart disease of native coronary artery without angina pectoris: Secondary | ICD-10-CM | POA: Insufficient documentation

## 2013-11-29 DIAGNOSIS — E785 Hyperlipidemia, unspecified: Secondary | ICD-10-CM | POA: Insufficient documentation

## 2013-11-29 DIAGNOSIS — E119 Type 2 diabetes mellitus without complications: Secondary | ICD-10-CM | POA: Insufficient documentation

## 2013-11-29 DIAGNOSIS — Z720 Tobacco use: Secondary | ICD-10-CM | POA: Insufficient documentation

## 2013-11-29 DIAGNOSIS — I208 Other forms of angina pectoris: Secondary | ICD-10-CM

## 2013-11-29 DIAGNOSIS — Z7982 Long term (current) use of aspirin: Secondary | ICD-10-CM | POA: Insufficient documentation

## 2013-11-29 DIAGNOSIS — I10 Essential (primary) hypertension: Secondary | ICD-10-CM | POA: Insufficient documentation

## 2013-11-29 HISTORY — PX: LEFT HEART CATHETERIZATION WITH CORONARY ANGIOGRAM: SHX5451

## 2013-11-29 LAB — GLUCOSE, CAPILLARY
GLUCOSE-CAPILLARY: 306 mg/dL — AB (ref 70–99)
Glucose-Capillary: 236 mg/dL — ABNORMAL HIGH (ref 70–99)

## 2013-11-29 SURGERY — LEFT HEART CATHETERIZATION WITH CORONARY ANGIOGRAM
Anesthesia: LOCAL

## 2013-11-29 MED ORDER — NITROGLYCERIN 1 MG/10 ML FOR IR/CATH LAB
INTRA_ARTERIAL | Status: AC
Start: 2013-11-29 — End: 2013-11-29
  Filled 2013-11-29: qty 10

## 2013-11-29 MED ORDER — VERAPAMIL HCL 2.5 MG/ML IV SOLN
INTRAVENOUS | Status: AC
  Filled 2013-11-29: qty 2

## 2013-11-29 MED ORDER — SODIUM CHLORIDE 0.9 % IV SOLN: INTRAVENOUS | Status: DC

## 2013-11-29 MED ORDER — MIDAZOLAM HCL 2 MG/2ML IJ SOLN
INTRAMUSCULAR | Status: AC
  Filled 2013-11-29: qty 2

## 2013-11-29 MED ORDER — SODIUM CHLORIDE 0.9 % IV SOLN: 250.0000 mL | INTRAVENOUS | Status: DC | PRN

## 2013-11-29 MED ORDER — SODIUM CHLORIDE 0.9 % IJ SOLN: 3.0000 mL | INTRAMUSCULAR | Status: DC | PRN

## 2013-11-29 MED ORDER — SODIUM CHLORIDE 0.9 % IJ SOLN: 3.0000 mL | Freq: Two times a day (BID) | INTRAMUSCULAR | Status: DC

## 2013-11-29 MED ORDER — ASPIRIN 81 MG PO CHEW
324.0000 mg | CHEWABLE_TABLET | ORAL | Status: AC
  Administered 2013-11-29: 324 mg via ORAL

## 2013-11-29 MED ORDER — SODIUM CHLORIDE 0.9 % IV SOLN
INTRAVENOUS | Status: DC
  Administered 2013-11-29: 07:00:00 via INTRAVENOUS

## 2013-11-29 MED ORDER — ONDANSETRON HCL 4 MG/2ML IJ SOLN: 4.0000 mg | Freq: Four times a day (QID) | INTRAMUSCULAR | Status: DC | PRN

## 2013-11-29 MED ORDER — HEPARIN (PORCINE) IN NACL 2-0.9 UNIT/ML-% IJ SOLN
INTRAMUSCULAR | Status: AC
  Filled 2013-11-29: qty 1000

## 2013-11-29 MED ORDER — INSULIN ASPART 100 UNIT/ML ~~LOC~~ SOLN
SUBCUTANEOUS | Status: DC
Start: 2013-11-29 — End: 2013-11-29
  Filled 2013-11-29: qty 1

## 2013-11-29 MED ORDER — INSULIN ASPART 100 UNIT/ML ~~LOC~~ SOLN
0.0000 [IU] | SUBCUTANEOUS | Status: DC
  Administered 2013-11-29: 7 [IU] via SUBCUTANEOUS

## 2013-11-29 MED ORDER — ACETAMINOPHEN 325 MG PO TABS
650.0000 mg | ORAL_TABLET | ORAL | Status: DC | PRN
Start: 2013-11-29 — End: 2013-11-29

## 2013-11-29 MED ORDER — ASPIRIN 81 MG PO CHEW
CHEWABLE_TABLET | ORAL | Status: AC
  Filled 2013-11-29: qty 4

## 2013-11-29 MED ORDER — FENTANYL CITRATE 0.05 MG/ML IJ SOLN
INTRAMUSCULAR | Status: AC
  Filled 2013-11-29: qty 2

## 2013-11-29 MED ORDER — LIDOCAINE HCL (PF) 1 % IJ SOLN
INTRAMUSCULAR | Status: AC
  Filled 2013-11-29: qty 30

## 2013-11-29 NOTE — CV Procedure (Signed)
Bruce Cabrera is a 48 y.o. male   [id number]  [id number] LOCATION:  FACILITY: [facility]  PHYSICIAN: Troy Sine, MD, [facility] [birth year]   DATE OF PROCEDURE:  11/29/2013     CARDIAC CATHETERIZATION    HISTORY: Bruce Cabrera is a 48 year old African American male who recently has developed substernal chest pain associated with diaphoresis and shortness of breath.  He was evaluated by Dr. Julianne Handler.  With the patient's cardiac risk factors  including diabetes mellitus, hypertension, hyperlipidemia, tobacco use and concern for possible unstable angina, definitive cardiac catheterization was recommended.   PROCEDURE:  Left heart catheterization: Coronary angiography, left ventriculography  The patient was brought to the second floor [facility] cath lab in the fasting state. The patient was premedicated with Versed  mg and fentanyl 25 mcg. A right radial approach was utilized after an Allen's test verified adequate circulation. The right radial artery was punctured via the Seldinger technique, and a 6 [place] Glidesheath Slender was inserted without difficulty.  A radial cocktail consisting of Verapamil, IV nitroglycerin, and lidocaine was administered. Weight adjusted heparin was administered. A safety J wire was advanced into the ascending aorta. Diagnostic catheterization was done with 5 [place] TIG 4.0 catheter and a 5French pigtail catheter was used for left ventriculography. A TR radial band was applied for hemostasis. The patient left the catheterization laboratory in stable condition.   HEMODYNAMICS:   Central Aorta: 97/64  Left Ventricle: 97/6  ANGIOGRAPHY:   The left main coronary artery was angiographically normal and bifurcated into the LAD and left circumflex coronary artery.   The ramus intermediate vessel is small caliber normal vessel  The LAD was a large caliber vessel that had mild 10-20% narrowing proximal to the first diagonal branch. There was smooth 30% focal  distal narrowing.  It then, the vessel.  The vessel extended to and wrapped around the LV apex and was otherwise free significant disease.  The left circumflex coronary artery  gave rise to two major bifurcating obtuse marginal branches.  There was mild, smooth 20% ostial narrowing.  The RCA was angiographically normal dominant vessel and  gave rise to a large PDA and PLA vessel.   Left ventriculography revealed normal global LV contractility without focal segmental wall motion abnormalities. There was no evidence for mitral regurgitation.  The estimated ejection fraction is 55%.  There were no focal, segmental wall motion abnormalities   Total contrast used: 90 cc Omnipaque  IMPRESSION:  Mild nonobstructive coronary artery disease with smooth 10-20% proximal LAD narrowing, 30% focal distal LAD stenosis; 20% ostial circumflex stenosis and a normal dominant large right coronary artery.  Normal LV function without focal segmental wall motion analysis.   RECOMMENDATION:  Medical therapy.  Smoking cessation is imperative.   Troy Sine, MD, [facility] 11/29/2013 10:41 AM

## 2013-11-29 NOTE — Interval H&P Note (Signed)
Cath Lab Visit (complete for each Cath Lab visit)  Clinical Evaluation Leading to the Procedure:   ACS: No.  Non-ACS:    Anginal Classification: CCS III  Anti-ischemic medical therapy: Maximal Therapy (2 or more classes of medications)  Non-Invasive Test Results: No non-invasive testing performed  Prior CABG: No previous CABG      History and Physical Interval Note:  11/29/2013 9:44 AM  Bruce Cabrera  has presented today for surgery, with the diagnosis of CP  The various methods of treatment have been discussed with the patient and family. After consideration of risks, benefits and other options for treatment, the patient has consented to  Procedure(s): LEFT HEART CATHETERIZATION WITH CORONARY ANGIOGRAM (N/A) as a surgical intervention .  The patient's history has been reviewed, patient examined, no change in status, stable for surgery.  I have reviewed the patient's chart and labs.  Questions were answered to the patient's satisfaction.     Bruce Cabrera A

## 2013-11-29 NOTE — H&P (View-Only) (Signed)
ER CONSULT NOTE   Patient ID: [name] MRN: [id number], DOB/AGE: 1965/02/23 48 y.o. Date of Encounter: 11/25/2013  Primary Physician: No PCP Per Patient Primary Cardiologist: New  Chief Complaint:  Chest pain  HPI: Bruce Cabrera is a 48 y.o. male with no history of CAD. He has multiple cardiac risk factors. At about 11:30 pm yesterday, he had onset of substernal chest pain, that was associated with diaphoresis and SOB. It lasted over an hour, finally resolving. He did not try any medications. He was able to sleep. This am, he had the same symptoms. He was concerned about them and came to the hospital. He was given SL NTG x 1 and ASA 325 mg. His pain was initially a 4/10, but has now resolved. He is currently pain-free.   He also was noted to have 3 scalp abscesses, treated with I&D in the ER.   He has never had symptoms like this before. He does not exercise, but has never had exertional chest pain. He has had no bleeding issues and has had no recent illnesses or injuries. He was diagnosed with diabetes several years ago, and has lost over 100 lbs since then, keeping it off for over 6 years.  Past Medical History  Diagnosis Date  . Diabetes mellitus without complication   . Hypertension   . Hyperlipidemia associated with type 2 diabetes mellitus     based on 2011 profile    Surgical History:  Past Surgical History  Procedure Laterality Date  . None       I have reviewed the patient's current medications. Prior to Admission medications   Medication Sig Start Date End Date Taking? Authorizing Provider  aspirin EC 81 MG tablet Take 81 mg by mouth daily.   Yes Historical Provider, MD   PRN Meds:.nitroGLYCERIN  Allergies: No Known Allergies  History   Social History  . Marital Status: Single    Spouse Name: N/A    Number of Children: N/A  . Years of Education: N/A   Occupational History  . 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 Smoking status:  Current Every Day Smoker -- 0.50 packs/day for 30 years    Types: Cigarettes  . Smokeless tobacco: Never Used  . Alcohol Use: Yes     Comment: 2- tallboys a day  . Drug Use: No  . Sexual Activity: Not on file   Other Topics Concern  . Not on file   Social History Narrative   Lives with roommate    Family History  Problem Relation Age of Onset  . Heart attack Mother 9  . Heart failure Brother 17   Family Status  Relation Status Death Age  . Mother Deceased 49  . Father Deceased     no info  . Brother Alive     Review of Systems:   Full 14-point review of systems otherwise negative except as noted above.  Physical Exam: Blood pressure 142/95, pulse 88, temperature 98.6 F (37 C), temperature source Oral, resp. rate 20, SpO2 99.00%. General: Well developed, well nourished,male in no acute distress. Head: Normocephalic, atraumatic, sclera non-icteric, no xanthomas, nares are without discharge. Dentition: poor Neck: No carotid bruits. JVD not elevated. No thyromegally Lungs: Good expansion bilaterally. without wheezes or rhonchi.  Heart: Regular rate and rhythm with S1 S2.  No S3 or S4.  No murmur, no rubs, or gallops appreciated. Abdomen: Soft, non-tender, non-distended with normoactive bowel  sounds. No hepatomegaly. No rebound/guarding. No obvious abdominal masses. Msk:  Strength and tone appear normal for age. No joint deformities or effusions, no spine or costo-vertebral angle tenderness. Extremities: No clubbing or cyanosis. No edema.  Distal pedal pulses are 2+ in 4 extrem Neuro: Alert and oriented X 3. Moves all extremities spontaneously. No focal deficits noted. Psych:  Responds to questions appropriately with a normal affect. Skin: No rashes noted. Lesions, s/p I&D, hemorrhage controlled.  Labs:   Lab Results  Component Value Date   WBC 6.4 11/25/2013   HGB 16.8 11/25/2013   HCT 46.2 11/25/2013   MCV 78.4 11/25/2013   PLT 223 11/25/2013     Recent Labs Lab  11/25/13 1253  NA 136*  K 4.2  CL 98  CO2 24  BUN 7  CREATININE 0.77  CALCIUM 9.4  GLUCOSE 285*    Recent Labs  11/25/13 1259 11/25/13 1623  TROPIPOC 0.00 0.00   Lab Results  Component Value Date   CHOL 207* 11/13/2009   HDL 48 11/13/2009   LDLCALC 141* 11/13/2009   TRIG 90 11/13/2009   Radiology/Studies: Dg Chest 2 View 11/25/2013   CLINICAL DATA:  Cough.  Left chest pain.  Initial evaluation.  EXAM: CHEST  2 VIEW  COMPARISON:  04/18/2004.  FINDINGS: Mediastinum hilar structures normal. Mild right base subsegmental atelectasis versus infiltrate. No pleural effusion or pneumothorax. Heart size normal. No acute bony abnormality  IMPRESSION: Mild right base subsegmental atelectasis and or infiltrate.   Electronically Signed   By: Marcello Moores  Register   On: 11/25/2013 13:28   ECG: 11/25/2013 Sinus tachycardia Possible Anterolateral infarct , age undetermined Abnormal ECG 66mm/s 39mm/mV 100Hz  8.0 SP2 12SL 241 CID: 61 Referred by: Unconfirmed Vent. rate 101 BPM PR interval 178 ms QRS duration 98 ms QT/QTc 348/451 ms P-R-T axes 53 270 40   ASSESSMENT AND PLAN:  Principal Problem:   Anginal chest pain at rest - [name] has multiple cardiac risk factors, not well controlled. He has had no medications in over a year. He has no insurance and does not get routine medical care. His symptoms are concerning for unstable anginal pain, and his ECG is abnormal. Fortunately, his troponin is negative so far.  See below  Active Problems:   Essential hypertension   Signed, Rosaria Ferries, PA-C 11/25/2013 4:40 PM Beeper [phone]    I have personally seen and examined this patient with Rosaria Ferries, PA-C. I agree with the assessment and plan as outlined above. My plan is as follows:   1. Chest pain concerning for unstable angina: He has multiple risk factors for CAD including HTN, HLD, DM, obesity, ongoing tobacco abuse and FH of premature CAD. He is high risk for obstructive CAD. His  clinical presentation is c/w unstable angina. We had planned on admission with cath tomorrow but he is refusing to be admitted. He is leaving the ED against medical advice today. His blood sugar is uncontrolled. His BP is uncontrolled. I have had a long discussion regarding his risk of death if he indeed is having unstable angina and does not accept our plan for admission. He is not willing to hear this. He states that he must go home to let his dogs out. His mental status is clear and it is clear that he is capable of making his own medical decisions. Will start Lopressor 25 mg po BID, Lisinopril 5 mg Qdaily and give him NTG to use prn for pain. He is instructed to come back  to the ED if he has recurrence of chest pain. We have placed him on the cath list for an outpatient cath on Monday 11/29/13 with Dr. Claiborne Billings.   2. HTN: BP uncontrolled. Will start Lopressor and Lisinopril as above. He has been out of all meds since [facility] closed.   3. DM: Blood sugar elevated today. He will need to be restarted on his diabetic medications. This can be arranged post cath. He needs to be plugged into a free medical clinic for primary care.   MCALHANY,CHRISTOPHER 11/25/2013\ 5:12 PM

## 2013-11-29 NOTE — Discharge Instructions (Signed)
Radial Site Care °Refer to this sheet in the next few weeks. These instructions provide you with information on caring for yourself after your procedure. Your caregiver may also give you more specific instructions. Your treatment has been planned according to current medical practices, but problems sometimes occur. Call your caregiver if you have any problems or questions after your procedure. °HOME CARE INSTRUCTIONS °· You may shower the day after the procedure. Remove the bandage (dressing) and gently wash the site with plain soap and water. Gently pat the site dry. °· Do not apply powder or lotion to the site. °· Do not submerge the affected site in water for 3 to 5 days. °· Inspect the site at least twice daily. °· Do not flex or bend the affected arm for 24 hours. °· No lifting over 5 pounds (2.3 kg) for 5 days after your procedure. °· Do not drive home if you are discharged the same day of the procedure. Have someone else drive you. °· You may drive 24 hours after the procedure unless otherwise instructed by your caregiver. °· Do not operate machinery or power tools for 24 hours. °· A responsible adult should be with you for the first 24 hours after you arrive home. °What to expect: °· Any bruising will usually fade within 1 to 2 weeks. °· Blood that collects in the tissue (hematoma) may be painful to the touch. It should usually decrease in size and tenderness within 1 to 2 weeks. °SEEK IMMEDIATE MEDICAL CARE IF: °· You have unusual pain at the radial site. °· You have redness, warmth, swelling, or pain at the radial site. °· You have drainage (other than a small amount of blood on the dressing). °· You have chills. °· You have a fever or persistent symptoms for more than 72 hours. °· You have a fever and your symptoms suddenly get worse. °· Your arm becomes pale, cool, tingly, or numb. °· You have heavy bleeding from the site. Hold pressure on the site. °Document Released: 03/09/2010 Document Revised:  04/29/2011 Document Reviewed: 03/09/2010 °ExitCare® Patient Information ©2015 [employer]. This information is not intended to replace advice given to you by your health care provider. Make sure you discuss any questions you have with your health care provider. ° °

## 2013-12-10 ENCOUNTER — Encounter: Payer: Self-pay | Admitting: Cardiovascular Disease

## 2014-01-27 ENCOUNTER — Encounter (HOSPITAL_COMMUNITY): Payer: Self-pay | Admitting: Cardiovascular Disease

## 2014-12-12 ENCOUNTER — Other Ambulatory Visit: Payer: Self-pay | Admitting: Physician Assistant

## 2014-12-14 NOTE — Telephone Encounter (Signed)
Please advise - Patient's pharmacy is requesting refill of Lisinopril 5 mg and Metoprolol 25 mg.  Patient has not been seen in our office, or does not have a future appt.   Medication was prescribed in Emergency Dept. In Oct. 2015.

## 2014-12-15 NOTE — Telephone Encounter (Signed)
He needs to be seen in the office before meds can be refilled. (OK to get primary MD to do it).

## 2014-12-30 ENCOUNTER — Other Ambulatory Visit: Payer: Self-pay | Admitting: *Deleted

## 2015-02-09 ENCOUNTER — Other Ambulatory Visit: Payer: Self-pay

## 2015-02-09 MED ORDER — LISINOPRIL 5 MG PO TABS
5.0000 mg | ORAL_TABLET | Freq: Every day | ORAL | Status: DC
Start: 2015-02-09 — End: 2015-11-17

## 2015-02-09 MED ORDER — METOPROLOL TARTRATE 25 MG PO TABS: 25.0000 mg | ORAL_TABLET | Freq: Two times a day (BID) | ORAL | Status: DC

## 2015-11-10 ENCOUNTER — Emergency Department (HOSPITAL_COMMUNITY)
Admission: EM | Admit: 2015-11-10 | Discharge: 2015-11-10 | Disposition: A | Discharge status: Home / Self Care | Payer: Self-pay | Attending: Emergency Medicine | Admitting: Emergency Medicine

## 2015-11-10 ENCOUNTER — Emergency Department (HOSPITAL_COMMUNITY): Payer: Self-pay

## 2015-11-10 ENCOUNTER — Encounter (HOSPITAL_COMMUNITY): Payer: Self-pay | Admitting: Emergency Medicine

## 2015-11-10 DIAGNOSIS — I159 Secondary hypertension, unspecified: Secondary | ICD-10-CM | POA: Insufficient documentation

## 2015-11-10 DIAGNOSIS — D751 Secondary polycythemia: Secondary | ICD-10-CM | POA: Insufficient documentation

## 2015-11-10 DIAGNOSIS — Z7982 Long term (current) use of aspirin: Secondary | ICD-10-CM | POA: Insufficient documentation

## 2015-11-10 DIAGNOSIS — R1013 Epigastric pain: Secondary | ICD-10-CM | POA: Insufficient documentation

## 2015-11-10 DIAGNOSIS — Z79899 Other long term (current) drug therapy: Secondary | ICD-10-CM | POA: Insufficient documentation

## 2015-11-10 DIAGNOSIS — R079 Chest pain, unspecified: Secondary | ICD-10-CM | POA: Insufficient documentation

## 2015-11-10 DIAGNOSIS — R739 Hyperglycemia, unspecified: Secondary | ICD-10-CM

## 2015-11-10 DIAGNOSIS — R0602 Shortness of breath: Secondary | ICD-10-CM | POA: Insufficient documentation

## 2015-11-10 DIAGNOSIS — E1165 Type 2 diabetes mellitus with hyperglycemia: Secondary | ICD-10-CM | POA: Insufficient documentation

## 2015-11-10 DIAGNOSIS — F17210 Nicotine dependence, cigarettes, uncomplicated: Secondary | ICD-10-CM | POA: Insufficient documentation

## 2015-11-10 LAB — COMPREHENSIVE METABOLIC PANEL
ALBUMIN: 2.9 g/dL — AB (ref 3.5–5.0)
ALT: 15 U/L — ABNORMAL LOW (ref 17–63)
ANION GAP: 8 (ref 5–15)
AST: 24 U/L (ref 15–41)
Alkaline Phosphatase: 41 U/L (ref 38–126)
BILIRUBIN TOTAL: 1.7 mg/dL — AB (ref 0.3–1.2)
BUN: <5 mg/dL — ABNORMAL LOW (ref 6–20)
CO2: 28 mmol/L (ref 22–32)
Calcium: 8.8 mg/dL — ABNORMAL LOW (ref 8.9–10.3)
Chloride: 100 mmol/L — ABNORMAL LOW (ref 101–111)
Creatinine, Ser: 0.88 mg/dL (ref 0.61–1.24)
GFR calc non Af Amer: >60 mL/min (ref >60)
GLUCOSE: 192 mg/dL — AB (ref 65–99)
POTASSIUM: 3.9 mmol/L (ref 3.5–5.1)
Sodium: 136 mmol/L (ref 135–145)
TOTAL PROTEIN: 6.1 g/dL — AB (ref 6.5–8.1)

## 2015-11-10 LAB — LIPASE, BLOOD: LIPASE: 22 U/L (ref 11–51)

## 2015-11-10 LAB — CBC WITH DIFFERENTIAL/PLATELET
BASOS ABS: 0 10*3/uL (ref 0.0–0.1)
BASOS PCT: 1 %
Basophils Absolute: 0 10*3/uL (ref 0.0–0.1)
Basophils Relative: 1 %
Eosinophils Absolute: 0.3 10*3/uL (ref 0.0–0.7)
Eosinophils Absolute: 0.3 10*3/uL (ref 0.0–0.7)
Eosinophils Relative: 5 %
Eosinophils Relative: 5 %
HCT: 64.3 % — ABNORMAL HIGH (ref 39.0–52.0)
HEMATOCRIT: 64 % — AB (ref 39.0–52.0)
HEMOGLOBIN: 22.5 g/dL — AB (ref 13.0–17.0)
Hemoglobin: 22.5 g/dL (ref 13.0–17.0)
Lymphocytes Relative: 44 %
Lymphocytes Relative: 43 %
Lymphs Abs: 2.3 10*3/uL (ref 0.7–4.0)
Lymphs Abs: 2.3 10*3/uL (ref 0.7–4.0)
MCH: 29 pg (ref 26.0–34.0)
MCH: 29 pg (ref 26.0–34.0)
MCHC: 35.2 g/dL (ref 30.0–36.0)
MCHC: 35 g/dL (ref 30.0–36.0)
MCV: 82.6 fL (ref 78.0–100.0)
MCV: 82.9 fL (ref 78.0–100.0)
Monocytes Absolute: 0.8 10*3/uL (ref 0.1–1.0)
Monocytes Absolute: 0.8 10*3/uL (ref 0.1–1.0)
Monocytes Relative: 15 %
Monocytes Relative: 15 %
NEUTROS ABS: 1.9 10*3/uL (ref 1.7–7.7)
NEUTROS PCT: 36 %
Neutro Abs: 1.9 10*3/uL (ref 1.7–7.7)
Neutrophils Relative %: 37 %
Platelets: 118 10*3/uL — ABNORMAL LOW (ref 150–400)
Platelets: 129 10*3/uL — ABNORMAL LOW (ref 150–400)
RBC: 7.75 MIL/uL — ABNORMAL HIGH (ref 4.22–5.81)
RBC: 7.76 MIL/uL — ABNORMAL HIGH (ref 4.22–5.81)
RDW: 14.9 % (ref 11.5–15.5)
RDW: 14.9 % (ref 11.5–15.5)
WBC: 5.3 10*3/uL (ref 4.0–10.5)
WBC: 5.2 10*3/uL (ref 4.0–10.5)

## 2015-11-10 LAB — URINALYSIS, ROUTINE W REFLEX MICROSCOPIC
Glucose, UA: 100 mg/dL — AB
Ketones, ur: 15 mg/dL — AB
Leukocytes, UA: NEGATIVE
NITRITE: NEGATIVE
PROTEIN: 100 mg/dL — AB
SPECIFIC GRAVITY, URINE: 1.01 (ref 1.005–1.030)
pH: 5 (ref 5.0–8.0)

## 2015-11-10 LAB — I-STAT TROPONIN, ED: TROPONIN I, POC: 0 ng/mL (ref 0.00–0.08)

## 2015-11-10 LAB — URINE MICROSCOPIC-ADD ON

## 2015-11-10 LAB — CBG MONITORING, ED: Glucose-Capillary: 171 mg/dL — ABNORMAL HIGH (ref 65–99)

## 2015-11-10 LAB — POC OCCULT BLOOD, ED: Fecal Occult Bld: NEGATIVE

## 2015-11-10 LAB — BRAIN NATRIURETIC PEPTIDE: B NATRIURETIC PEPTIDE 5: 56.9 pg/mL (ref 0.0–100.0)

## 2015-11-10 MED ORDER — LISINOPRIL 5 MG PO TABS: 5.0000 mg | ORAL_TABLET | Freq: Every day | ORAL | 1 refills | Status: DC

## 2015-11-10 MED ORDER — METOPROLOL TARTRATE 25 MG PO TABS: 25.0000 mg | ORAL_TABLET | Freq: Two times a day (BID) | ORAL | 1 refills | Status: DC

## 2015-11-10 MED ORDER — SUCRALFATE 1 G PO TABS: 1.0000 g | ORAL_TABLET | Freq: Three times a day (TID) | ORAL | 0 refills | Status: DC

## 2015-11-10 MED ORDER — FAMOTIDINE 20 MG PO TABS: 20.0000 mg | ORAL_TABLET | Freq: Two times a day (BID) | ORAL | 0 refills | Status: DC

## 2015-11-10 MED ORDER — IOPAMIDOL (ISOVUE-300) INJECTION 61%
INTRAVENOUS | Status: AC
  Administered 2015-11-10: 100 mL
  Filled 2015-11-10: qty 100

## 2015-11-10 MED FILL — FAMOTIDINE 20 MG TABLET: 20 | 15 days supply | Qty: 30 | Fill #0

## 2015-11-10 MED FILL — METOPROLOL TARTRATE 25 MG T: 25 | 30 days supply | Qty: 60 | Fill #0

## 2015-11-10 NOTE — ED Notes (Signed)
PA at bedside.

## 2015-11-10 NOTE — Care Management Note (Signed)
Case Management Note  Patient Details  Name: Bruce Cabrera MRN: [id number] Date of Birth: 1965/08/04  Subjective/Objective:                  50 yo male in ED for medication refill and arm pain. From home alone.  Action/Plan: Follow for disposition needs.   Expected Discharge Date:  11/10/15               Expected Discharge Plan:     In-House Referral:     Discharge planning Services     Post Acute Care Choice:    Choice offered to:     DME Arranged:    DME Agency:     HH Arranged:    HH Agency:     Status of Service:     If discussed at [employer], dates discussed:    Additional Comments: EDCM consulted to regarding medication assistance.  Met with pt at bedside, pt inquiring about orange card application.  EDCM advised pt to have Rx filled at [facility], provided discount coupons/cards and orange card application.  Pt verbalized understanding; no further CM needs communicated at this time. Fuller Mandril, RN 11/10/2015, 2:20 PM

## 2015-11-10 NOTE — Discharge Instructions (Signed)
Continue your blood pressure medications. Take pepcid and carafate as prescribed for your stomach pain. Stop smoking and drinking. Follow up with a primary care doctor regarding your blood work today and elevated hemoglobin level.

## 2015-11-10 NOTE — ED Triage Notes (Signed)
Pt sts out of htn and DM meds x 2 years and having mid sternal CP and some tingling in right arm starting last night

## 2015-11-10 NOTE — ED Notes (Signed)
Pt CBG, 171. Nurse was notified.

## 2015-11-10 NOTE — ED Notes (Signed)
Pt expressed frustration with wait to speak with case management.  This RN discussed cost of meds and process of establishing followup care with pt versus delay.  Pt agreed to wait for case manager.

## 2015-11-10 NOTE — ED Notes (Signed)
Patient was given a diet coke.  

## 2015-11-10 NOTE — ED Provider Notes (Signed)
[facility] DEPT Provider Note   CSN: [id number] Arrival date & time: 11/10/15  [id number]     History   Chief Complaint Chief Complaint  Patient presents with  . Chest Pain  . Medication Refill    HPI Bruce Cabrera is a 50 y.o. male.  HPI Bruce Cabrera is a 50 y.o. male with hx of htn, diabetes, presents to ED with complaint of chest pain, abdominal pain, hematuria, hematemesis 1 week ago. Pt states he is not taking any medications for his blood pressure at this time. States no PCP. States that he has never taken any DM medications. He reports epigastric pain for several weeks. He states he had episode of emesis last week and it was bloody. None since then. He reports normal bowel movements. He reports seeing some blood in his urine as well. Patient states he drinks every day, drinks around 8 alcoholic beverages a day. He is also smoker. He denies any back pain. No dysuria. No fever or chills. States yesterday was sitting at home was to be when developed retrosternal chest pain. Patient states pain was mild, did not radiate. No associated shortness of breath, diaphoresis, cough, dizziness. He states when he woke up this morning he still had the pain so he came to emergency department. No history of cardiac problems in the past. Denies any recent travel, surgeries, no swelling in extremities. He does endorse some orthopnea.  Past Medical History:  Diagnosis Date  . Diabetes mellitus without complication ([place])   . Hyperlipidemia associated with type 2 diabetes mellitus ([place])    based on 2011 profile  . Hypertension     Patient Active Problem List   Diagnosis Date Noted  . Anginal chest pain at rest [facility]) 11/25/2013  . Diabetes mellitus ([place]) 11/25/2013  . DIABETES MELLITUS, TYPE II 10/13/2006  . HYPERLIPIDEMIA 10/13/2006  . Essential hypertension 10/13/2006  . OBESITY 10/10/2006    Past Surgical History:  Procedure Laterality Date  . LEFT HEART CATHETERIZATION WITH CORONARY  ANGIOGRAM N/A 11/29/2013   Procedure: LEFT HEART CATHETERIZATION WITH CORONARY ANGIOGRAM;  Surgeon: Troy Sine, MD;  Location: [facility] CATH LAB;  Service: Cardiovascular;  Laterality: N/A;  . None         Home Medications    Prior to Admission medications   Medication Sig Start Date End Date Taking? Authorizing Provider  aspirin EC 81 MG tablet Take 1 tablet (81 mg total) by mouth daily. Patient not taking: Reported on 11/10/2015 11/25/13   Gustavus Bryant, MD  lisinopril (PRINIVIL,ZESTRIL) 5 MG tablet Take 1 tablet (5 mg total) by mouth daily. Patient not taking: Reported on 11/10/2015 02/09/15   Troy Sine, MD  metoprolol tartrate (LOPRESSOR) 25 MG tablet Take 1 tablet (25 mg total) by mouth 2 (two) times daily. Patient not taking: Reported on 11/10/2015 02/09/15   Troy Sine, MD  nitroGLYCERIN (NITROSTAT) 0.4 MG SL tablet Place 1 tablet (0.4 mg total) under the tongue every 5 (five) minutes as needed for chest pain. Patient not taking: Reported on 11/10/2015 11/25/13   Gustavus Bryant, MD    Family History Family History  Problem Relation Age of Onset  . Heart attack Mother 58  . Heart failure Brother 54    Social History Social History  Substance Use Topics  . Smoking status: Current Every Day Smoker    Packs/day: 0.50    Years: 30.00    Types: Cigarettes  . Smokeless tobacco: Never Used  . Alcohol use Yes  Comment: 2- tallboys a day     Allergies   Review of patient's allergies indicates no known allergies.   Review of Systems Review of Systems  Constitutional: Negative for chills and fever.  Respiratory: Negative for cough, chest tightness and shortness of breath.   Cardiovascular: Positive for chest pain. Negative for palpitations and leg swelling.  Gastrointestinal: Positive for abdominal pain. Negative for abdominal distention, diarrhea, nausea and vomiting.  Genitourinary: Positive for hematuria. Negative for dysuria, frequency and urgency.    Musculoskeletal: Negative for arthralgias, myalgias, neck pain and neck stiffness.  Skin: Negative for rash.  Allergic/Immunologic: Negative for immunocompromised state.  Neurological: Negative for dizziness, weakness, light-headedness, numbness and headaches.  All other systems reviewed and are negative.    Physical Exam Updated Vital Signs BP 153/97   Pulse 62   Temp 98.9 F (37.2 C) (Oral)   Resp 21   Ht 6\' 3"  (1.905 m)   Wt 113.4 kg   SpO2 97%   BMI 31.25 kg/m   Physical Exam  Constitutional: He is oriented to person, place, and time. He appears well-developed and well-nourished. No distress.  HENT:  Head: Normocephalic and atraumatic.  Eyes: Conjunctivae are normal.  Neck: Normal range of motion. Neck supple.  Cardiovascular: Normal rate, regular rhythm and normal heart sounds.   Pulmonary/Chest: Effort normal. No respiratory distress. He has no wheezes. He has no rales. He exhibits no tenderness.  Abdominal: Soft. Bowel sounds are normal. He exhibits no distension. There is tenderness. There is no rebound.  Epigastric tenderness. No guarding. No rebound tenderness.  Musculoskeletal: He exhibits no edema.  Neurological: He is alert and oriented to person, place, and time. He has normal reflexes. No cranial nerve deficit. Coordination normal.  Skin: Skin is warm and dry. Capillary refill takes less than 2 seconds.  Nursing note and vitals reviewed.    ED Treatments / Results  Labs (all labs ordered are listed, but only abnormal results are displayed) Labs Reviewed  CBC WITH DIFFERENTIAL/PLATELET - Abnormal; Notable for the following:       Result Value   RBC 7.75 (*)    Hemoglobin 22.5 (*)    HCT 64.0 (*)    Platelets 118 (*)    All other components within normal limits  URINALYSIS, ROUTINE W REFLEX MICROSCOPIC (NOT AT [facility]) - Abnormal; Notable for the following:    Color, Urine AMBER (*)    Glucose, UA 100 (*)    Hgb urine dipstick MODERATE (*)    Bilirubin  Urine SMALL (*)    Ketones, ur 15 (*)    Protein, ur 100 (*)    All other components within normal limits  COMPREHENSIVE METABOLIC PANEL - Abnormal; Notable for the following:    Chloride 100 (*)    Glucose, Bld 192 (*)    BUN <5 (*)    Calcium 8.8 (*)    Total Protein 6.1 (*)    Albumin 2.9 (*)    ALT 15 (*)    Total Bilirubin 1.7 (*)    All other components within normal limits  CBC WITH DIFFERENTIAL/PLATELET - Abnormal; Notable for the following:    RBC 7.76 (*)    Hemoglobin 22.5 (*)    HCT 64.3 (*)    Platelets 129 (*)    All other components within normal limits  URINE MICROSCOPIC-ADD ON - Abnormal; Notable for the following:    Squamous Epithelial / LPF 0-5 (*)    Bacteria, UA RARE (*)  Casts HYALINE CASTS (*)    All other components within normal limits  CBG MONITORING, ED - Abnormal; Notable for the following:    Glucose-Capillary 171 (*)    All other components within normal limits  LIPASE, BLOOD  BRAIN NATRIURETIC PEPTIDE  I-STAT TROPOININ, ED  POC OCCULT BLOOD, ED    EKG  EKG Interpretation  Date/Time:  Friday November 10 2015 07:13:18 EDT Ventricular Rate:  95 PR Interval:  174 QRS Duration: 102 QT Interval:  366 QTC Calculation: 459 R Axis:   -77 Text Interpretation:  Normal sinus rhythm Left anterior fascicular block Anterolateral infarct , age undetermined Abnormal ECG similar to previous from 11/2013 Confirmed by KOHUT  MD, Robinson [phone]) on 11/10/2015 7:28:32 AM       Radiology Dg Chest 2 View  Result Date: 11/10/2015 CLINICAL DATA:  MID CHEST PAIN/EPIGASTRIC PAIN SINCE YESTERDAY AFTERNOON.COUGH,SMOKER,HTN,DM EXAM: CHEST - 2 VIEW COMPARISON:  11/25/2013 FINDINGS: Lungs are clear. Improved aeration in the right lower lobe since previous exam. Heart size and mediastinal contours are within normal limits. No effusion. Visualized bones unremarkable. IMPRESSION: No acute cardiopulmonary disease. Electronically Signed   By: Lucrezia Europe M.D.   On:  11/10/2015 09:05   Ct Abdomen Pelvis W Contrast  Result Date: 11/10/2015 CLINICAL DATA:  Epigastric pain 2 days.  Vomiting yesterday. EXAM: CT ABDOMEN AND PELVIS WITH CONTRAST TECHNIQUE: Multidetector CT imaging of the abdomen and pelvis was performed using the standard protocol following bolus administration of intravenous contrast. CONTRAST:  177mL ISOVUE-300 IOPAMIDOL (ISOVUE-300) INJECTION 61% COMPARISON:  None. FINDINGS: Lower chest: No acute abnormality. Retroareolar fibroglandular soft tissue consistent with gynecomastia. Hepatobiliary: No focal liver abnormality is seen. No gallstones, gallbladder wall thickening, or biliary dilatation. Pancreas: Unremarkable. No pancreatic ductal dilatation or surrounding inflammatory changes. Spleen: Normal in size without focal abnormality. Adrenals/Urinary Tract: Adrenal glands are unremarkable. Kidneys are normal, without renal calculi, solid masses, or hydronephrosis. 4.6 x 3.6 cm hypodense, fluid attenuating left renal mass most consistent with a cyst. Bladder is unremarkable. Stomach/Bowel: Stomach is within normal limits. Appendix appears normal. No evidence of bowel wall thickening, distention, or inflammatory changes. Vascular/Lymphatic: No significant vascular findings are present. No enlarged abdominal or pelvic lymph nodes. Reproductive: Prostate is enlarged. Other: No abdominal wall hernia or abnormality. No abdominopelvic ascites. Musculoskeletal: No acute or significant osseous findings. Mild right facet arthropathy at L4-5. Degenerative disc disease with disc height loss at L5-S1. Mild osteoarthritis of bilateral sacroiliac joints. IMPRESSION: 1. No acute abdominal or pelvic pathology. 2. Bilateral gynecomastia. Electronically Signed   By: Kathreen Devoid   On: 11/10/2015 11:31    Procedures Procedures (including critical care time)  Medications Ordered in ED Medications  iopamidol (ISOVUE-300) 61 % injection (100 mLs  Contrast Given 11/10/15 1111)       Initial Impression / Assessment and Plan / ED Course  I have reviewed the triage vital signs and the nursing notes.  Pertinent labs & imaging results that were available during my care of the patient were reviewed by me and considered in my medical decision making (see chart for details).  Clinical Course   Patient emergency department with multiple complaints. Patient stated main reason he is here is because he developed some chest discomfort yesterday that persisted through today. He is also requesting blood pressure medication refill. Has been off of his medications for 2 years. He states at some point in time he was also told he may have diabetes, but was never started any medications. He is  also complaining of some epigastric abdominal pain. One episode of hematemesis one week ago after drinking. He denies any dizziness or lightheadedness. He is in no acute distress at this time. States pain is only mild.   Patient has polycythemia. No history of the same. CBG 192.  12:04 PM Abnormal lasbs as listed above. VS continue to be stable with mild htn. CT abd and pelvis did not show any acute abnorality other than renal cyst. Discussed with Dr. Wilson Singer. Concerning for hgb of 22, will need further outpatient work up. Discussed results with pt and encouraged follow up closely. Pt voiced understanding and stated will follow up. Discussed alcohol and smoking cessation. CP atypical, constant since yesterday. Negative trop here. Will start on pepcid and carafate for gastritis. Return precautions discussed.  Pt received case manager consult prior to dc home.   Vitals:   11/10/15 1030 11/10/15 1045 11/10/15 1100 11/10/15 1444  BP: 146/93 137/96 153/97 (!) 167/110  Pulse: (!) 59 77 62 88  Resp: 25 25 21 16   Temp:    98.3 F (36.8 C)  TempSrc:    Oral  SpO2: 98% 97% 97% 100%  Weight:      Height:         Final Clinical Impressions(s) / ED Diagnoses   Final diagnoses:  Polycythemia  Chest  pain, unspecified chest pain type  Epigastric pain  Hyperglycemia  Secondary hypertension, unspecified    New Prescriptions Discharge Medication List as of 11/10/2015 12:15 PM    START taking these medications   Details  famotidine (PEPCID) 20 MG tablet Take 1 tablet (20 mg total) by mouth 2 (two) times daily., Starting Fri 11/10/2015, Print    !! lisinopril (PRINIVIL,ZESTRIL) 5 MG tablet Take 1 tablet (5 mg total) by mouth daily., Starting Fri 11/10/2015, Print    !! metoprolol tartrate (LOPRESSOR) 25 MG tablet Take 1 tablet (25 mg total) by mouth 2 (two) times daily., Starting Fri 11/10/2015, Print    sucralfate (CARAFATE) 1 g tablet Take 1 tablet (1 g total) by mouth 4 (four) times daily -  with meals and at bedtime., Starting Fri 11/10/2015, Print     !! - Potential duplicate medications found. Please discuss with provider.       Jeannett Senior, PA-C 11/10/15 [place], MD 11/11/15 [phone]

## 2015-11-17 ENCOUNTER — Ambulatory Visit: Payer: Self-pay | Attending: Internal Medicine | Admitting: Physician Assistant

## 2015-11-17 VITALS — BP 141/94 | HR 75 | Temp 98.4°F | Resp 16 | Wt 224.0 lb

## 2015-11-17 DIAGNOSIS — Z7982 Long term (current) use of aspirin: Secondary | ICD-10-CM | POA: Insufficient documentation

## 2015-11-17 DIAGNOSIS — E0829 Diabetes mellitus due to underlying condition with other diabetic kidney complication: Secondary | ICD-10-CM

## 2015-11-17 DIAGNOSIS — E1129 Type 2 diabetes mellitus with other diabetic kidney complication: Secondary | ICD-10-CM | POA: Insufficient documentation

## 2015-11-17 DIAGNOSIS — Z79899 Other long term (current) drug therapy: Secondary | ICD-10-CM | POA: Insufficient documentation

## 2015-11-17 DIAGNOSIS — D751 Secondary polycythemia: Secondary | ICD-10-CM

## 2015-11-17 DIAGNOSIS — F17210 Nicotine dependence, cigarettes, uncomplicated: Secondary | ICD-10-CM | POA: Insufficient documentation

## 2015-11-17 DIAGNOSIS — I10 Essential (primary) hypertension: Secondary | ICD-10-CM

## 2015-11-17 DIAGNOSIS — N289 Disorder of kidney and ureter, unspecified: Secondary | ICD-10-CM | POA: Insufficient documentation

## 2015-11-17 DIAGNOSIS — E785 Hyperlipidemia, unspecified: Secondary | ICD-10-CM | POA: Insufficient documentation

## 2015-11-17 LAB — CBC WITH DIFFERENTIAL/PLATELET
Basophils Absolute: 0 cells/uL (ref 0–200)
Basophils Relative: 0 %
EOS PCT: 3 %
Eosinophils Absolute: 105 cells/uL (ref 15–500)
HCT: 59.9 % — ABNORMAL HIGH (ref 38.5–50.0)
Hemoglobin: 21.7 g/dL — ABNORMAL HIGH (ref 13.2–17.1)
LYMPHS PCT: 54 %
Lymphs Abs: 1890 cells/uL (ref 850–3900)
MCH: 29.7 pg (ref 27.0–33.0)
MCHC: 35.4 g/dL (ref 32.0–36.0)
MCV: 82.1 fL (ref 80.0–100.0)
MONOS PCT: 11 %
Monocytes Absolute: 385 cells/uL (ref 200–950)
NEUTROS ABS: 1120 {cells}/uL — AB (ref 1500–7800)
Neutrophils Relative %: 32 %
PLATELETS: 166 10*3/uL (ref 140–400)
RBC: 7.3 MIL/uL — AB (ref 4.20–5.80)
RDW: 15.4 % — AB (ref 11.0–15.0)
WBC: 3.5 10*3/uL — AB (ref 3.8–10.8)

## 2015-11-17 LAB — POCT GLYCOSYLATED HEMOGLOBIN (HGB A1C): HEMOGLOBIN A1C: 8.8

## 2015-11-17 LAB — GLUCOSE, POCT (MANUAL RESULT ENTRY): POC GLUCOSE: 246 mg/dL — AB (ref 70–99)

## 2015-11-17 MED ORDER — METOPROLOL TARTRATE 25 MG PO TABS: 25.0000 mg | ORAL_TABLET | Freq: Two times a day (BID) | ORAL | 3 refills | Status: DC

## 2015-11-17 MED ORDER — LOSARTAN POTASSIUM 100 MG PO TABS: 100.0000 mg | ORAL_TABLET | Freq: Every day | ORAL | 3 refills | Status: DC

## 2015-11-17 MED ORDER — TRUE METRIX METER W/DEVICE KIT: PACK | 0 refills | Status: DC

## 2015-11-17 MED ORDER — METFORMIN HCL 1000 MG PO TABS: 1000.0000 mg | ORAL_TABLET | Freq: Two times a day (BID) | ORAL | 3 refills | Status: DC

## 2015-11-17 MED ORDER — GLUCOSE BLOOD VI STRP: ORAL_STRIP | 12 refills | Status: DC

## 2015-11-17 MED ORDER — LOSARTAN POTASSIUM 100 MG PO TABS: 50.0000 mg | ORAL_TABLET | Freq: Every day | ORAL | 1 refills | Status: DC

## 2015-11-17 MED ORDER — TRUEPLUS LANCETS 28G MISC: 3 refills | Status: DC

## 2015-11-17 MED FILL — metFORMIN HCL 1000 MG TABS: 1000 | 90 days supply | Qty: 180 | Fill #0

## 2015-11-17 MED FILL — ?LOSARTAN POTASSIUM 100 MG: 100 | 30 days supply | Qty: 15 | Fill #0

## 2015-11-17 NOTE — Progress Notes (Signed)
omPt is in the office today for ed follow up Pt states he is not in any pain Pt states he is taking medication without difficulty

## 2015-11-17 NOTE — Progress Notes (Signed)
Bruce Cabrera, is a 50 y.o. male  [id number]  [id number]  DOB - September 20, 1965  Subjective:  Chief Complaint and HPI: Bruce Cabrera is a 50 y.o. male here today to establish care and for a follow up visit after being seen in the ED on 11/10/2015 for blood in his urine, an episode of hematemesis after drinking alcohol, and chest pressure. His Hgb=22.5 and his platelets were 129.  EKG showed no new ischemic changes but did show an old infarct.  His cardiac enzymes were negative and cardiac causes of his symptoms were ruled out.  His blood sugar was elevated.  He has know that he has diabetes for years now without consistent f/up or treatment.  He does not have a home glucometer and was not given a prescription to start.  He smokes 1ppd.  He drinks alcohol daily.  He was prescribed Lisinopril in the ED but does not want to take it.  He had a friend who had lip-swelling with it and read information about it online, so he says he is not willing to take this.   He feels fine today.  No further vomiting and he says his urine appears clear now.  No further chest pressure.    ED/Hospital notes reviewed.     ROS:   Constitutional:  No f/c, No night sweats, No unexplained weight loss. EENT:  No vision changes, No blurry vision, No hearing changes. No mouth, throat, or ear problems.  Respiratory: No cough, No SOB Cardiac: No CP(since ED visit), no palpitations GI:  No abd pain, No N/V/D(none since ED visit) GU: No Urinary s/sx Musculoskeletal: No joint pain Neuro: No headache, no dizziness, no motor weakness.  Skin: No rash Endocrine:  No polydipsia. No polyuria.  Psych: Denies SI/HI  No problems updated.  ALLERGIES: No Known Allergies  PAST MEDICAL HISTORY: Past Medical History:  Diagnosis Date  . Diabetes mellitus without complication ([place])   . Hyperlipidemia associated with type 2 diabetes mellitus ([place])    based on 2011 profile  . Hypertension     MEDICATIONS AT HOME: Prior to  Admission medications   Medication Sig Start Date End Date Taking? Authorizing Provider  aspirin EC 81 MG tablet Take 1 tablet (81 mg total) by mouth daily. 11/25/13  Yes Gustavus Bryant, MD  famotidine (PEPCID) 20 MG tablet Take 1 tablet (20 mg total) by mouth 2 (two) times daily. 11/10/15  Yes Tatyana Kirichenko, PA-C  metoprolol tartrate (LOPRESSOR) 25 MG tablet Take 1 tablet (25 mg total) by mouth 2 (two) times daily. 11/17/15  Yes Argentina Donovan, PA-C  Blood Glucose Monitoring Suppl (TRUE METRIX METER) w/Device KIT Test fasting blood sugars and at bedtime 11/17/15   Argentina Donovan, PA-C  glucose blood (TRUE METRIX BLOOD GLUCOSE TEST) test strip Use as instructed 11/17/15   Argentina Donovan, PA-C  losartan (COZAAR) 100 MG tablet Take 0.5 tablets (50 mg total) by mouth daily. 11/17/15   Argentina Donovan, PA-C  metFORMIN (GLUCOPHAGE) 1000 MG tablet Take 1 tablet (1,000 mg total) by mouth 2 (two) times daily with a meal. 11/17/15   Argentina Donovan, PA-C  nitroGLYCERIN (NITROSTAT) 0.4 MG SL tablet Place 1 tablet (0.4 mg total) under the tongue every 5 (five) minutes as needed for chest pain. Patient not taking: Reported on 11/17/2015 11/25/13   Gustavus Bryant, MD  sucralfate (CARAFATE) 1 g tablet Take 1 tablet (1 g total) by mouth 4 (four) times daily -  with meals and at  bedtime. Patient not taking: Reported on 11/17/2015 11/10/15   Jeannett Senior, PA-C  TRUEPLUS LANCETS 28G MISC Use as directed 11/17/15   Argentina Donovan, PA-C     Objective:  EXAM:   Vitals:   11/17/15 0925  BP: (!) 141/94  Pulse: 75  Resp: 16  Temp: 98.4 F (36.9 C)  TempSrc: Oral  SpO2: 97%  Weight: 224 lb (101.6 kg)    General appearance : A&OX3. NAD. Non-toxic-appearing HEENT: Atraumatic and Normocephalic.  PERRLA. EOM intact.  TM clear B. Mouth-MMM, post pharynx WNL w/o erythema, No PND. Neck: supple, no JVD. No cervical lymphadenopathy. No thyromegaly Chest/Lungs:  Breathing-non-labored, Good air entry  bilaterally, breath sounds normal without rales, rhonchi, or wheezing  CVS: S1 S2 regular, no murmurs, gallops, rubs  Extremities: Bilateral Lower Ext shows no edema, both legs are warm to touch with = pulse throughout Neurology:  CN II-XII grossly intact, Non focal.   Psych:  TP linear. J/I WNL. Normal speech. Appropriate eye contact and affect.  Skin:  No Rash  Data Review Lab Results  Component Value Date   HGBA1C 12.5 (H) 11/25/2013   A1C today=8.8  Assessment & Plan   1. Essential hypertension Continue Metoprolol 36m bid Stop Lisinopril due to patient preference(see above) - losartan (COZAAR) 100 MG tablet; Take 0.5 tablets (50 mg total) by mouth daily.  Dispense: 45 tablet; Refill: 1  2. 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  Dispense: 180 tablet; Refill: 3 - glucose blood (TRUE METRIX BLOOD GLUCOSE TEST) test strip; Use as instructed  Dispense: 100 each; Refill: 12 - Blood Glucose Monitoring Suppl (TRUE METRIX METER) w/Device KIT; Test fasting blood sugars and at bedtime  Dispense: 1 kit; Refill: 0  3. Polycythemia, secondary Unsure etiology.  Hgb 1 yr ago=16.8; 11/10/2015=22.5 Smoking cessation imperative - CBC with Differential/Platelet  Alcohol cessation imperative to overall health conditions as well.  Discussed 12 step recovery/AA at length.  He is not receptive at this time and will try quitting on his own.    Patient have been counseled extensively about nutrition and exercise  Return in about 3 weeks (around 12/08/2015)  for establish with PCP; f/up polycythemia, diabetes, htn.  The patient was given clear instructions to go to ER or return to medical center if symptoms don't improve, worsen or new problems develop. The patient verbalized understanding. The patient was told to call to get lab results if they haven't heard anything in the next week.     AFreeman Caldron PA-C [facility]  11/17/2015, 1:30 PMPatient ID: SDARIC KOREN male   DOB: [birth year] 50y.o.   MRN: [id number]

## 2015-11-17 NOTE — Patient Instructions (Addendum)
Metformin-take 1/2 tablet daily for the first 3 days, then 1/2 tablet twice daily for 3 days then increase to 1 tablet twice daily.    Check your blood sugars fasting and at bedtime and record and bring in with you in 3 weeks    Diabetes and Exercise Exercising regularly is important. It is not just about losing weight. It has many health benefits, such as:  Improving your overall fitness, flexibility, and endurance.  Increasing your bone density.  Helping with weight control.  Decreasing your body fat.  Increasing your muscle strength.  Reducing stress and tension.  Improving your overall health. People with diabetes who exercise gain additional benefits because exercise:  Reduces appetite.  Improves the body's use of blood sugar (glucose).  Helps lower or control blood glucose.  Decreases blood pressure.  Helps control blood lipids (such as cholesterol and triglycerides).  Improves the body's use of the hormone insulin by:  Increasing the body's insulin sensitivity.  Reducing the body's insulin needs.  Decreases the risk for heart disease because exercising:  Lowers cholesterol and triglycerides levels.  Increases the levels of good cholesterol (such as high-density lipoproteins [HDL]) in the body.  Lowers blood glucose levels. YOUR ACTIVITY PLAN  Choose an activity that you enjoy, and set realistic goals. To exercise safely, you should begin practicing any new physical activity slowly, and gradually increase the intensity of the exercise over time. Your health care provider or diabetes educator can help create an activity plan that works for you. General recommendations include:  Encouraging children to engage in at least 60 minutes of physical activity each day.  Stretching and performing strength training exercises, such as yoga or weight lifting, at least 2 times per week.  Performing a total of at least 150 minutes of moderate-intensity exercise each week,  such as brisk walking or water aerobics.  Exercising at least 3 days per week, making sure you allow no more than 2 consecutive days to pass without exercising.  Avoiding long periods of inactivity (90 minutes or more). When you have to spend an extended period of time sitting down, take frequent breaks to walk or stretch. RECOMMENDATIONS FOR EXERCISING WITH TYPE 1 OR TYPE 2 DIABETES   Check your blood glucose before exercising. If blood glucose levels are greater than 240 mg/dL, check for urine ketones. Do not exercise if ketones are present.  Avoid injecting insulin into areas of the body that are going to be exercised. For example, avoid injecting insulin into:  The arms when playing tennis.  The legs when jogging.  Keep a record of:  Food intake before and after you exercise.  Expected peak times of insulin action.  Blood glucose levels before and after you exercise.  The type and amount of exercise you have done.  Review your records with your health care provider. Your health care provider will help you to develop guidelines for adjusting food intake and insulin amounts before and after exercising.  If you take insulin or oral hypoglycemic agents, watch for signs and symptoms of hypoglycemia. They include:  Dizziness.  Shaking.  Sweating.  Chills.  Confusion.  Drink plenty of water while you exercise to prevent dehydration or heat stroke. Body water is lost during exercise and must be replaced.  Talk to your health care provider before starting an exercise program to make sure it is safe for you. Remember, almost any type of activity is better than none.   This information is not intended to replace  advice given to you by your health care provider. Make sure you discuss any questions you have with your health care provider.   Document Released: 04/27/2003 Document Revised: 06/21/2014 Document Reviewed: 07/14/2012 Elsevier Interactive Patient Education [employer]

## 2015-11-20 ENCOUNTER — Other Ambulatory Visit: Payer: Self-pay | Admitting: Physician Assistant

## 2015-11-20 DIAGNOSIS — D45 Polycythemia vera: Secondary | ICD-10-CM

## 2015-11-24 ENCOUNTER — Telehealth: Payer: Self-pay | Admitting: General Practice

## 2015-11-24 ENCOUNTER — Telehealth: Payer: Self-pay

## 2015-11-24 ENCOUNTER — Telehealth: Payer: Self-pay | Admitting: Oncology

## 2015-11-24 ENCOUNTER — Encounter: Payer: Self-pay | Admitting: Oncology

## 2015-11-24 NOTE — Telephone Encounter (Signed)
Pt returned my call to schedule appt. Appt scheduled w/Shadad on 11/7 @2pm . Pt aware to arrive 15-30 minutes early. Demographics verified. Letter to the patient

## 2015-11-24 NOTE — Telephone Encounter (Signed)
Contacted pt to go over lab results pt is aware of results and doesn't have any questions or concerns 

## 2015-11-24 NOTE — Telephone Encounter (Signed)
Spoke with patient and he is aware why the referral was placed

## 2015-12-26 ENCOUNTER — Ambulatory Visit (HOSPITAL_BASED_OUTPATIENT_CLINIC_OR_DEPARTMENT_OTHER): Payer: Self-pay | Admitting: Oncology

## 2015-12-26 ENCOUNTER — Other Ambulatory Visit (HOSPITAL_BASED_OUTPATIENT_CLINIC_OR_DEPARTMENT_OTHER): Payer: Self-pay

## 2015-12-26 ENCOUNTER — Telehealth: Payer: Self-pay | Admitting: Oncology

## 2015-12-26 VITALS — BP 177/102 | HR 90 | Temp 98.2°F | Resp 20 | Ht 75.0 in | Wt 224.0 lb

## 2015-12-26 DIAGNOSIS — D751 Secondary polycythemia: Secondary | ICD-10-CM | POA: Insufficient documentation

## 2015-12-26 LAB — CBC WITH DIFFERENTIAL/PLATELET
BASO%: 0.6 % (ref 0.0–2.0)
BASOS ABS: 0 10*3/uL (ref 0.0–0.1)
EOS ABS: 0.2 10*3/uL (ref 0.0–0.5)
EOS%: 3 % (ref 0.0–7.0)
HCT: 64.8 % — ABNORMAL HIGH (ref 38.4–49.9)
HGB: 21.1 g/dL — ABNORMAL HIGH (ref 13.0–17.1)
LYMPH%: 46.8 % (ref 14.0–49.0)
MCH: 26.4 pg — AB (ref 27.2–33.4)
MCHC: 32.6 g/dL (ref 32.0–36.0)
MCV: 81.2 fL (ref 79.3–98.0)
MONO#: 0.5 10*3/uL (ref 0.1–0.9)
MONO%: 9.3 % (ref 0.0–14.0)
NEUT#: 2.1 10*3/uL (ref 1.5–6.5)
NEUT%: 40.3 % (ref 39.0–75.0)
Platelets: 197 10*3/uL (ref 140–400)
RBC: 7.98 10*6/uL — ABNORMAL HIGH (ref 4.20–5.82)
RDW: 14.9 % — ABNORMAL HIGH (ref 11.0–14.6)
WBC: 5.2 10*3/uL (ref 4.0–10.3)
lymph#: 2.4 10*3/uL (ref 0.9–3.3)

## 2015-12-26 LAB — COMPREHENSIVE METABOLIC PANEL
ALBUMIN: 3.4 g/dL — AB (ref 3.5–5.0)
ALT: 13 U/L (ref 0–55)
AST: 13 U/L (ref 5–34)
Alkaline Phosphatase: 40 U/L (ref 40–150)
Anion Gap: 12 mEq/L — ABNORMAL HIGH (ref 3–11)
BILIRUBIN TOTAL: 1.3 mg/dL — AB (ref 0.20–1.20)
BUN: 6.6 mg/dL — ABNORMAL LOW (ref 7.0–26.0)
CALCIUM: 9.7 mg/dL (ref 8.4–10.4)
CO2: 25 meq/L (ref 22–29)
CREATININE: 0.7 mg/dL (ref 0.7–1.3)
Chloride: 104 mEq/L (ref 98–109)
EGFR: >90 mL/min/{1.73_m2} (ref >90)
Glucose: 98 mg/dl (ref 70–140)
Potassium: 3.7 mEq/L (ref 3.5–5.1)
SODIUM: 140 meq/L (ref 136–145)
Total Protein: 7.2 g/dL (ref 6.4–8.3)

## 2015-12-26 NOTE — Telephone Encounter (Signed)
Gave patient avs report and appointments for November thru January  °

## 2015-12-26 NOTE — Progress Notes (Signed)
Reason for Referral: Polycythemia.   HPI: 50 year old gentleman currently of [place] where he lived the majority of his life. He does have history of hypertension, hyperlipidemia and diabetes. He also has history of polysubstance abuse including tobacco and alcohol. He continues to smoke although he of cut down on smoking considerably. He used to be a heavy drinker in the past although cut down on that as well. He presented with abdominal pain in September 2017 and a CBC obtained at that time showed a hemoglobin of 22.5 with a normal white cell count and platelet count of 118. A repeat CBC on 11/10/2015 confirmed these findings. CBC on 11/17/2015 showed a white cell count of 3.5, hemoglobin of 21 and platelets of 166. His MCV was normal at 82 with normal differential. CT scan of the abdomen and pelvis done on 11/10/2015 was unremarkable except for gynecomastia. He is asymptomatic at this time and reports feeling well. He denied any headaches, angina or difficulty breathing. He denied any bleeding or thrombosis episodes. He denied any recent toxic exposure or hormone supplements. 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  He denies any headaches, blurry vision, syncope or seizure or any other neurological symptoms. He does not report any chest pain, palpitation, orthopnea or leg edema. He does not report any fevers, chills, sweats, weight loss or constitutional symptoms. He does not report any cough, wheezing or hemoptysis. He does not report any nausea, vomiting or abdominal pain. He does not report any frequency urgency or hesitancy. He does not report any skeletal complaints. Remaining review of systems unremarkable.   Past Medical History:  Diagnosis Date  . Diabetes mellitus without complication ([place])   . Hyperlipidemia associated with type 2 diabetes mellitus ([place])    based on 2011 profile  . Hypertension   :  Past Surgical History:  Procedure Laterality Date  .  LEFT HEART CATHETERIZATION WITH CORONARY ANGIOGRAM N/A 11/29/2013   Procedure: LEFT HEART CATHETERIZATION WITH CORONARY ANGIOGRAM;  Surgeon: Troy Sine, MD;  Location: [facility] CATH LAB;  Service: Cardiovascular;  Laterality: N/A;  . None    :   Current Outpatient Prescriptions:  .  aspirin EC 81 MG tablet, Take 1 tablet (81 mg total) by mouth daily., Disp: 30 tablet, Rfl: 11 .  Blood Glucose Monitoring Suppl (TRUE METRIX METER) w/Device KIT, Test fasting blood sugars and at bedtime, Disp: 1 kit, Rfl: 0 .  glucose blood (TRUE METRIX BLOOD GLUCOSE TEST) test strip, Use as instructed, Disp: 100 each, Rfl: 12 .  losartan (COZAAR) 100 MG tablet, Take 0.5 tablets (50 mg total) by mouth daily., Disp: 45 tablet, Rfl: 1 .  metFORMIN (GLUCOPHAGE) 1000 MG tablet, Take 1 tablet (1,000 mg total) by mouth 2 (two) times daily with a meal., Disp: 180 tablet, Rfl: 3 .  metoprolol tartrate (LOPRESSOR) 25 MG tablet, Take 1 tablet (25 mg total) by mouth 2 (two) times daily., Disp: 60 tablet, Rfl: 3 .  TRUEPLUS LANCETS 28G MISC, Use as directed, Disp: 1 each, Rfl: 3 .  nitroGLYCERIN (NITROSTAT) 0.4 MG SL tablet, Place 1 tablet (0.4 mg total) under the tongue every 5 (five) minutes as needed for chest pain. (Patient not taking: Reported on 12/26/2015), Disp: 25 tablet, Rfl: 3:  No Known Allergies:  Family History  Problem Relation Age of Onset  . Heart attack Mother 28  . Heart failure Brother 40  :  Social History   Social History  . Marital status: Single  Spouse name: N/A  . Number of children: N/A  . Years of education: N/A   Occupational History  . Dispatcher at [employer]  . Smoking status: Current Every Day Smoker    Packs/day: 0.50    Years: 30.00    Types: Cigarettes  . Smokeless tobacco: Never Used  . Alcohol use Yes     Comment: 2- tallboys a day  . Drug use: No  . Sexual activity: Not on file   Other Topics Concern  . Not on file   Social History  Narrative   Lives with roommate  :  Pertinent items are noted in HPI.  Exam: Blood pressure (!) 177/102, pulse 90, temperature 98.2 F (36.8 C), temperature source Oral, resp. rate 20, height 6' 3"  (1.905 m), weight 224 lb (101.6 kg), SpO2 100 %.  ECOG 0.  General appearance: alert and cooperative appeared without distress. Head: Normocephalic, without obvious abnormality sclerae anicteric slightly injected. Throat: lips, mucosa, and tongue normal; teeth and gums normal Neck: no adenopathy Back: negative Resp: clear to auscultation bilaterally Chest wall: no tenderness Cardio: regular rate and rhythm, S1, S2 normal, no murmur, click, rub or gallop GI: soft, non-tender; bowel sounds normal; no masses,  no organomegaly Extremities: extremities normal, atraumatic, no cyanosis or edema  CBC    Component Value Date/Time   WBC 3.5 (L) 11/17/2015 1035   RBC 7.30 (H) 11/17/2015 1035   HGB 21.7 (H) 11/17/2015 1035   HCT 59.9 (H) 11/17/2015 1035   PLT 166 11/17/2015 1035   MCV 82.1 11/17/2015 1035   MCH 29.7 11/17/2015 1035   MCHC 35.4 11/17/2015 1035   RDW 15.4 (H) 11/17/2015 1035   LYMPHSABS 1,890 11/17/2015 1035   MONOABS 385 11/17/2015 1035   EOSABS 105 11/17/2015 1035   BASOSABS 0 11/17/2015 1035     Chemistry      Component Value Date/Time   NA 136 11/10/2015 0936   K 3.9 11/10/2015 0936   CL 100 (L) 11/10/2015 0936   CO2 28 11/10/2015 0936   BUN <5 (L) 11/10/2015 0936   CREATININE 0.88 11/10/2015 0936      Component Value Date/Time   CALCIUM 8.8 (L) 11/10/2015 0936   ALKPHOS 41 11/10/2015 0936   AST 24 11/10/2015 0936   ALT 15 (L) 11/10/2015 0936   BILITOT 1.7 (H) 11/10/2015 0936       Assessment and Plan:   50 year old gentleman with the following issues:  1. Polycythemia presented with a hemoglobin on multiple occasions in September 2017 ranging between 21 and 22. His hematocrit was around 59-60. He had a normal CBC in October 2015 with a hemoglobin of  16.8. He is asymptomatic at this time.  The differential diagnosis was discussed today with the patient. Secondary causes appear to be less likely at this point although he does report smoking which could be contributory. He has been a heavy smoker in the past and have actually cut down recently and does not fully explain the elevation in his hemoglobin. He denied any toxic fume exposure or hormone supplements. Imaging studies of the abdomen and pelvis did not show any malignancy that could be producing erythropoietin.  Primary myeloproliferative disorder such as polycythemia vera is a consideration in this setting. To confirm the diagnosis I will obtain JAK2 mutation testing as well as an erythropoietin level.  Management standpoint, I have recommended therapeutic phlebotomy to institute as soon as possible given his high risk of developing  arterial thrombosis events. These would include strokes, myocardial infarction as well as possibly constitutional symptoms. Risks and benefits of phlebotomy was reviewed today and he is agreeable to proceed. We will start him on phlebotomy on a every other week basis to get his hematocrit less than 50.  2. Thrombosis prophylaxis: He is already on aspirin which will help decreasing his risk. He is young and does not have any thrombosis history which decreases his risk of thrombosis overall.  3. Cytoreductive therapy: This will be a consideration once his diagnosis is confirmed. Hydroxyurea would be an option in the future to control his counts if needed to.  4. Follow-up: Will be in 2 months to assess his response to therapeutic phlebotomy.

## 2015-12-27 LAB — ERYTHROPOIETIN: Erythropoietin: 2.5 m[IU]/mL — ABNORMAL LOW (ref 2.6–18.5)

## 2015-12-29 ENCOUNTER — Ambulatory Visit: Payer: Self-pay | Admitting: Family Medicine

## 2016-01-01 ENCOUNTER — Ambulatory Visit (HOSPITAL_BASED_OUTPATIENT_CLINIC_OR_DEPARTMENT_OTHER): Payer: Self-pay

## 2016-01-01 VITALS — BP 160/89 | HR 86 | Temp 98.7°F | Resp 18

## 2016-01-01 DIAGNOSIS — D751 Secondary polycythemia: Secondary | ICD-10-CM

## 2016-01-01 MED FILL — LOSARTAN POTASSIUM 100 MG T: 100 | 30 days supply | Qty: 15 | Fill #1

## 2016-01-01 MED FILL — ?METOPROLOL 25 MG TABLET: 25 | 30 days supply | Qty: 60 | Fill #1

## 2016-01-01 NOTE — Progress Notes (Signed)
16-gauge needle inserted in R AC. 526 grams phlebotomized from patient in approximately 20 minutes. Patient tolerated well. No complaints of dizziness, headache, etc. Snack and drink offered and accepted. 30 minute observation time started.   Wylene Simmer, BSN, RN 01/01/2016 8:41 AM

## 2016-01-01 NOTE — Patient Instructions (Signed)
Therapeutic Phlebotomy, Care After  Refer to this sheet in the next few weeks. These instructions provide you with information about caring for yourself after your procedure. Your health care provider may also give you more specific instructions. Your treatment has been planned according to current medical practices, but problems sometimes occur. Call your health care provider if you have any problems or questions after your procedure.  WHAT TO EXPECT AFTER THE PROCEDURE  After your procedure, it is common to have:   Light-headedness or dizziness. You may feel faint.   Nausea.   Tiredness.  HOME CARE INSTRUCTIONS  Activities   Return to your normal activities as directed by your health care provider. Most people can go back to their normal activities right away.   Avoid strenuous physical activity and heavy lifting or pulling for about 5 hours after the procedure. Do not lift anything that is heavier than 10 lb (4.5 kg).   Athletes should avoid strenuous exercise for at least 12 hours.   Change positions slowly for the remainder of the day. This will help to prevent light-headedness or fainting.   If you feel light-headed, lie down until the feeling goes away.  Eating and Drinking   Be sure to eat well-balanced meals for the next 24 hours.   Drink enough fluid to keep your urine clear or pale yellow.   Avoid drinking alcohol on the day that you had the procedure.  Care of the Needle Insertion Site   Keep your bandage dry. You can remove the bandage after about 5 hours or as directed by your health care provider.   If you have bleeding from the needle insertion site, elevate your arm and press firmly on the site until the bleeding stops.   If you have bruising at the site, apply ice to the area:   Put ice in a plastic bag.   Place a towel between your skin and the bag.   Leave the ice on for 20 minutes, 2-3 times a day for the first 24 hours.   If the swelling does not go away after 24 hours, apply  a warm, moist washcloth to the area for 20 minutes, 2-3 times a day.  General Instructions   Avoid smoking for at least 30 minutes after the procedure.   Keep all follow-up visits as directed by your health care provider. It is important to continue with further therapeutic phlebotomy treatments as directed.  SEEK MEDICAL CARE IF:   You have redness, swelling, or pain at the needle insertion site.   You have fluid, blood, or pus coming from the needle insertion site.   You feel light-headed, dizzy, or nauseated, and the feeling does not go away.   You notice new bruising at the needle insertion site.   You feel weaker than normal.   You have a fever or chills.  SEEK IMMEDIATE MEDICAL CARE IF:   You have severe nausea or vomiting.   You have chest pain.   You have trouble breathing.    This information is not intended to replace advice given to you by your health care provider. Make sure you discuss any questions you have with your health care provider.    Document Released: 07/09/2010 Document Revised: 06/21/2014 Document Reviewed: 01/31/2014  Elsevier Interactive Patient Education 2016 Elsevier Inc.

## 2016-01-15 ENCOUNTER — Other Ambulatory Visit: Payer: Self-pay | Admitting: *Deleted

## 2016-01-15 ENCOUNTER — Ambulatory Visit (HOSPITAL_BASED_OUTPATIENT_CLINIC_OR_DEPARTMENT_OTHER): Payer: Self-pay

## 2016-01-15 ENCOUNTER — Other Ambulatory Visit (HOSPITAL_BASED_OUTPATIENT_CLINIC_OR_DEPARTMENT_OTHER): Payer: Self-pay

## 2016-01-15 VITALS — BP 142/91 | HR 85 | Temp 99.2°F | Resp 18

## 2016-01-15 DIAGNOSIS — D751 Secondary polycythemia: Secondary | ICD-10-CM

## 2016-01-15 LAB — CBC WITH DIFFERENTIAL/PLATELET
BASO%: 0.6 % (ref 0.0–2.0)
Basophils Absolute: 0 10*3/uL (ref 0.0–0.1)
EOS%: 3 % (ref 0.0–7.0)
Eosinophils Absolute: 0.2 10*3/uL (ref 0.0–0.5)
HCT: 58.1 % — ABNORMAL HIGH (ref 38.4–49.9)
HEMOGLOBIN: 18.8 g/dL — AB (ref 13.0–17.1)
LYMPH%: 50.4 % — AB (ref 14.0–49.0)
MCH: 26.3 pg — ABNORMAL LOW (ref 27.2–33.4)
MCHC: 32.3 g/dL (ref 32.0–36.0)
MCV: 81.4 fL (ref 79.3–98.0)
MONO#: 0.5 10*3/uL (ref 0.1–0.9)
MONO%: 9.5 % (ref 0.0–14.0)
NEUT%: 36.5 % — ABNORMAL LOW (ref 39.0–75.0)
NEUTROS ABS: 1.9 10*3/uL (ref 1.5–6.5)
Platelets: 197 10*3/uL (ref 140–400)
RBC: 7.14 10*6/uL — AB (ref 4.20–5.82)
RDW: 15 % — AB (ref 11.0–14.6)
WBC: 5.3 10*3/uL (ref 4.0–10.3)
lymph#: 2.7 10*3/uL (ref 0.9–3.3)

## 2016-01-15 NOTE — Patient Instructions (Signed)
Therapeutic Phlebotomy Therapeutic phlebotomy is the controlled removal of blood from a person's body for the purpose of treating a medical condition. The procedure is similar to donating blood. Usually, about a pint (470 mL, or 0.47L) of blood is removed. The average adult has 9-12 pints (4.3-5.7 L) of blood. Therapeutic phlebotomy may be used to treat the following medical conditions:  Hemochromatosis. This is a condition in which the blood contains too much iron.  Polycythemia vera. This is a condition in which the blood contains too many red blood cells.  Porphyria cutanea tarda. This is a disease in which an important part of hemoglobin is not made properly. It results in the buildup of abnormal amounts of porphyrins in the body.  Sickle cell disease. This is a condition in which the red blood cells form an abnormal crescent shape rather than a round shape. Tell a health care provider about:  Any allergies you have.  All medicines you are taking, including vitamins, herbs, eye drops, creams, and over-the-counter medicines.  Any problems you or family members have had with anesthetic medicines.  Any blood disorders you have.  Any surgeries you have had.  Any medical conditions you have. What are the risks? Generally, this is a safe procedure. However, problems may occur, including:  Nausea or light-headedness.  Low blood pressure.  Soreness, bleeding, swelling, or bruising at the needle insertion site.  Infection. What happens before the procedure?  Follow instructions from your health care provider about eating or drinking restrictions.  Ask your health care provider about changing or stopping your regular medicines. This is especially important if you are taking diabetes medicines or blood thinners.  Wear clothing with sleeves that can be raised above the elbow.  Plan to have someone take you home after the procedure.  You may have a blood sample taken. What happens  during the procedure?  A needle will be inserted into one of your veins.  Tubing and a collection bag will be attached to that needle.  Blood will flow through the needle and tubing into the collection bag.  You may be asked to open and close your hand slowly and continually during the entire collection.  After the specified amount of blood has been removed from your body, the collection bag and tubing will be clamped.  The needle will be removed from your vein.  Pressure will be held on the site of the needle insertion to stop the bleeding.  A bandage (dressing) will be placed over the needle insertion site. The procedure may vary among health care providers and hospitals. What happens after the procedure?  Your recovery will be assessed and monitored.  You can return to your normal activities as directed by your health care provider. This information is not intended to replace advice given to you by your health care provider. Make sure you discuss any questions you have with your health care provider. Document Released: 07/09/2010 Document Revised: 10/07/2015 Document Reviewed: 01/31/2014 Elsevier Interactive Patient Education  2017 Elsevier Inc.  

## 2016-01-16 ENCOUNTER — Ambulatory Visit: Payer: Self-pay | Admitting: Family Medicine

## 2016-01-24 ENCOUNTER — Ambulatory Visit: Payer: Self-pay

## 2016-01-29 ENCOUNTER — Ambulatory Visit (HOSPITAL_BASED_OUTPATIENT_CLINIC_OR_DEPARTMENT_OTHER): Payer: Self-pay

## 2016-01-29 ENCOUNTER — Other Ambulatory Visit: Payer: Self-pay | Admitting: *Deleted

## 2016-01-29 VITALS — BP 141/91 | HR 77 | Temp 98.8°F | Resp 16

## 2016-01-29 DIAGNOSIS — D751 Secondary polycythemia: Secondary | ICD-10-CM

## 2016-01-29 DIAGNOSIS — D45 Polycythemia vera: Secondary | ICD-10-CM

## 2016-01-29 LAB — CBC WITH DIFFERENTIAL/PLATELET
BASO%: 0.6 % (ref 0.0–2.0)
BASOS ABS: 0 10*3/uL (ref 0.0–0.1)
EOS ABS: 0.1 10*3/uL (ref 0.0–0.5)
EOS%: 2.9 % (ref 0.0–7.0)
HEMATOCRIT: 56.8 % — AB (ref 38.4–49.9)
HGB: 18.3 g/dL — ABNORMAL HIGH (ref 13.0–17.1)
LYMPH#: 2.6 10*3/uL (ref 0.9–3.3)
LYMPH%: 52.1 % — ABNORMAL HIGH (ref 14.0–49.0)
MCH: 26.3 pg — ABNORMAL LOW (ref 27.2–33.4)
MCHC: 32.2 g/dL (ref 32.0–36.0)
MCV: 81.6 fL (ref 79.3–98.0)
MONO#: 0.6 10*3/uL (ref 0.1–0.9)
MONO%: 11.8 % (ref 0.0–14.0)
NEUT#: 1.6 10*3/uL (ref 1.5–6.5)
NEUT%: 32.6 % — AB (ref 39.0–75.0)
PLATELETS: 196 10*3/uL (ref 140–400)
RBC: 6.96 10*6/uL — ABNORMAL HIGH (ref 4.20–5.82)
RDW: 15.7 % — ABNORMAL HIGH (ref 11.0–14.6)
WBC: 5 10*3/uL (ref 4.0–10.3)

## 2016-01-29 NOTE — Progress Notes (Signed)
Phlebotomy started at 1426 with an 18g phlebotomy kit to the left [facility]. Phlebotomy ended at 1438 and 527 grams were removed. Patient tolerated well. Offered snack and patient refused. Patient monitored for 30 minutes post phlebotomy. Patient and vital signs stable upon discharge.

## 2016-01-29 NOTE — Patient Instructions (Signed)
     Therapeutic Phlebotomy, Care After Refer to this sheet in the next few weeks. These instructions provide you with information about caring for yourself after your procedure. Your health care provider may also give you more specific instructions. Your treatment has been planned according to current medical practices, but problems sometimes occur. Call your health care provider if you have any problems or questions after your procedure. What can I expect after the procedure? After the procedure, it is common to have:  Light-headedness or dizziness. You may feel faint.  Nausea.  Tiredness. Follow these instructions at home: Activity  Return to your normal activities as directed by your health care provider. Most people can go back to their normal activities right away.  Avoid strenuous physical activity and heavy lifting or pulling for about 5 hours after the procedure. Do not lift anything that is heavier than 10 lb (4.5 kg).  Athletes should avoid strenuous exercise for at least 12 hours.  Change positions slowly for the remainder of the day. This will help to prevent light-headedness or fainting.  If you feel light-headed, lie down until the feeling goes away. Eating and drinking  Be sure to eat well-balanced meals for the next 24 hours.  Drink enough fluid to keep your urine clear or pale yellow.  Avoid drinking alcohol on the day that you had the procedure. Care of the Needle Insertion Site  Keep your bandage dry. You can remove the bandage after about 5 hours or as directed by your health care provider.  If you have bleeding from the needle insertion site, elevate your arm and press firmly on the site until the bleeding stops.  If you have bruising at the site, apply ice to the area:  Put ice in a plastic bag.  Place a towel between your skin and the bag.  Leave the ice on for 20 minutes, 2-3 times a day for the first 24 hours.  If the swelling does not go away  after 24 hours, apply a warm, moist washcloth to the area for 20 minutes, 2-3 times a day. General instructions  Avoid smoking for at least 30 minutes after the procedure.  Keep all follow-up visits as directed by your health care provider. It is important to continue with further therapeutic phlebotomy treatments as directed. Contact a health care provider if:  You have redness, swelling, or pain at the needle insertion site.  You have fluid, blood, or pus coming from the needle insertion site.  You feel light-headed, dizzy, or nauseated, and the feeling does not go away.  You notice new bruising at the needle insertion site.  You feel weaker than normal.  You have a fever or chills. Get help right away if:  You have severe nausea or vomiting.  You have chest pain.  You have trouble breathing. This information is not intended to replace advice given to you by your health care provider. Make sure you discuss any questions you have with your health care provider. Document Released: 07/09/2010 Document Revised: 10/07/2015 Document Reviewed: 01/31/2014 Elsevier Interactive Patient Education  2017 Elsevier Inc.  

## 2016-02-05 ENCOUNTER — Ambulatory Visit (HOSPITAL_BASED_OUTPATIENT_CLINIC_OR_DEPARTMENT_OTHER): Payer: Self-pay

## 2016-02-05 VITALS — BP 126/78 | HR 80 | Temp 98.9°F | Resp 16

## 2016-02-05 DIAGNOSIS — D751 Secondary polycythemia: Secondary | ICD-10-CM

## 2016-02-05 NOTE — Progress Notes (Signed)
Therapeutic phlebotomy performed with 16G in right AC. Started at 1431 and ending at 8, yielding 525g. Patient tolerated procedure well with no complaints. VSS. Stayed for 30 minute observation. Snack and drink offered.

## 2016-02-05 NOTE — Patient Instructions (Signed)
     Therapeutic Phlebotomy, Care After Refer to this sheet in the next few weeks. These instructions provide you with information about caring for yourself after your procedure. Your health care provider may also give you more specific instructions. Your treatment has been planned according to current medical practices, but problems sometimes occur. Call your health care provider if you have any problems or questions after your procedure. What can I expect after the procedure? After the procedure, it is common to have:  Light-headedness or dizziness. You may feel faint.  Nausea.  Tiredness. Follow these instructions at home: Activity  Return to your normal activities as directed by your health care provider. Most people can go back to their normal activities right away.  Avoid strenuous physical activity and heavy lifting or pulling for about 5 hours after the procedure. Do not lift anything that is heavier than 10 lb (4.5 kg).  Athletes should avoid strenuous exercise for at least 12 hours.  Change positions slowly for the remainder of the day. This will help to prevent light-headedness or fainting.  If you feel light-headed, lie down until the feeling goes away. Eating and drinking  Be sure to eat well-balanced meals for the next 24 hours.  Drink enough fluid to keep your urine clear or pale yellow.  Avoid drinking alcohol on the day that you had the procedure. Care of the Needle Insertion Site  Keep your bandage dry. You can remove the bandage after about 5 hours or as directed by your health care provider.  If you have bleeding from the needle insertion site, elevate your arm and press firmly on the site until the bleeding stops.  If you have bruising at the site, apply ice to the area:  Put ice in a plastic bag.  Place a towel between your skin and the bag.  Leave the ice on for 20 minutes, 2-3 times a day for the first 24 hours.  If the swelling does not go away  after 24 hours, apply a warm, moist washcloth to the area for 20 minutes, 2-3 times a day. General instructions  Avoid smoking for at least 30 minutes after the procedure.  Keep all follow-up visits as directed by your health care provider. It is important to continue with further therapeutic phlebotomy treatments as directed. Contact a health care provider if:  You have redness, swelling, or pain at the needle insertion site.  You have fluid, blood, or pus coming from the needle insertion site.  You feel light-headed, dizzy, or nauseated, and the feeling does not go away.  You notice new bruising at the needle insertion site.  You feel weaker than normal.  You have a fever or chills. Get help right away if:  You have severe nausea or vomiting.  You have chest pain.  You have trouble breathing. This information is not intended to replace advice given to you by your health care provider. Make sure you discuss any questions you have with your health care provider. Document Released: 07/09/2010 Document Revised: 10/07/2015 Document Reviewed: 01/31/2014 Elsevier Interactive Patient Education  2017 Elsevier Inc.  

## 2016-02-23 ENCOUNTER — Encounter: Payer: Self-pay | Admitting: *Deleted

## 2016-02-23 ENCOUNTER — Other Ambulatory Visit: Payer: Self-pay | Admitting: *Deleted

## 2016-02-23 DIAGNOSIS — D751 Secondary polycythemia: Secondary | ICD-10-CM

## 2016-02-26 ENCOUNTER — Telehealth: Payer: Self-pay | Admitting: Oncology

## 2016-02-26 ENCOUNTER — Other Ambulatory Visit (HOSPITAL_BASED_OUTPATIENT_CLINIC_OR_DEPARTMENT_OTHER): Payer: Self-pay

## 2016-02-26 ENCOUNTER — Ambulatory Visit (HOSPITAL_BASED_OUTPATIENT_CLINIC_OR_DEPARTMENT_OTHER): Payer: Self-pay | Admitting: Oncology

## 2016-02-26 VITALS — BP 159/89 | HR 68 | Temp 98.9°F | Resp 18 | Ht 75.0 in | Wt 223.6 lb

## 2016-02-26 DIAGNOSIS — D751 Secondary polycythemia: Secondary | ICD-10-CM

## 2016-02-26 LAB — CBC WITH DIFFERENTIAL/PLATELET
BASO%: 0.9 % (ref 0.0–2.0)
BASOS ABS: 0 10*3/uL (ref 0.0–0.1)
EOS%: 3.3 % (ref 0.0–7.0)
Eosinophils Absolute: 0.1 10*3/uL (ref 0.0–0.5)
HCT: 54 % — ABNORMAL HIGH (ref 38.4–49.9)
HGB: 17.6 g/dL — ABNORMAL HIGH (ref 13.0–17.1)
LYMPH%: 55.4 % — AB (ref 14.0–49.0)
MCH: 27.6 pg (ref 27.2–33.4)
MCHC: 32.5 g/dL (ref 32.0–36.0)
MCV: 84.9 fL (ref 79.3–98.0)
MONO#: 0.5 10*3/uL (ref 0.1–0.9)
MONO%: 11.4 % (ref 0.0–14.0)
NEUT#: 1.3 10*3/uL — ABNORMAL LOW (ref 1.5–6.5)
NEUT%: 29 % — AB (ref 39.0–75.0)
PLATELETS: 212 10*3/uL (ref 140–400)
RBC: 6.36 10*6/uL — AB (ref 4.20–5.82)
RDW: 18 % — ABNORMAL HIGH (ref 11.0–14.6)
WBC: 4.5 10*3/uL (ref 4.0–10.3)
lymph#: 2.5 10*3/uL (ref 0.9–3.3)

## 2016-02-26 NOTE — Telephone Encounter (Signed)
Appointments scheduled per 1/8 LOS. Patient given AVS report and calendars with future scheduled appointments.  °

## 2016-02-26 NOTE — Progress Notes (Signed)
Hematology and Oncology Follow Up Visit  Bruce Cabrera [id number] 03/18/1965 50 y.o. 02/26/2016 8:40 AM No primary care provider on file.Bruce Cabrera   Principle Diagnosis: 52 year old male with polycythemia diagnosed in September 2017. He presented with a hemoglobin of 22. His JAK-2 mutation was negative.   Prior Therapy: He is status post phlebotomy done in November and December 2017.  Current therapy: Intermittent phlebotomy.  Interim History: Bruce Cabrera presents today for a follow-up visit. Since her last visit, he reports no recent complaints. He did undergo phlebotomy on 2 separate occasions on 01/15/2016 and 01/29/2016. He tolerated this procedure well and felt reasonably better. At this time, he reports no symptoms of chest pain, headaches or neurological deficits. He continues to smoke however although has cut down significantly. He did not report any thrombotic or bleeding episodes. He continues to attend activities of daily living without any decline.   He denies any headaches, blurry vision, syncope or seizure or any other neurological symptoms. He does not report any chest pain, palpitation, orthopnea or leg edema. He does not report any fevers, chills, sweats, weight loss or constitutional symptoms. He does not report any cough, wheezing or hemoptysis. He does not report any nausea, vomiting or abdominal pain. He does not report any frequency urgency or hesitancy. He does not report any skeletal complaints. Remaining review of systems unremarkable.    Medications: I have reviewed the patient's current medications.  Current Outpatient Prescriptions  Medication Sig Dispense Refill  . aspirin EC 81 MG tablet Take 1 tablet (81 mg total) by mouth daily. 30 tablet 11  . Blood Glucose Monitoring Suppl (TRUE METRIX METER) w/Device KIT Test fasting blood sugars and at bedtime 1 kit 0  . glucose blood (TRUE METRIX BLOOD GLUCOSE TEST) test strip Use as instructed 100 each 12  .  losartan (COZAAR) 100 MG tablet Take 0.5 tablets (50 mg total) by mouth daily. 45 tablet 1  . metFORMIN (GLUCOPHAGE) 1000 MG tablet Take 1 tablet (1,000 mg total) by mouth 2 (two) times daily with a meal. 180 tablet 3  . metoprolol tartrate (LOPRESSOR) 25 MG tablet Take 1 tablet (25 mg total) by mouth 2 (two) times daily. 60 tablet 3  . nitroGLYCERIN (NITROSTAT) 0.4 MG SL tablet Place 1 tablet (0.4 mg total) under the tongue every 5 (five) minutes as needed for chest pain. (Patient not taking: Reported on 12/26/2015) 25 tablet 3  . TRUEPLUS LANCETS 28G MISC Use as directed 1 each 3   No current facility-administered medications for this visit.      Allergies: No Known Allergies  Past Medical History, Surgical history, Social history, and Family History were reviewed and updated.  Physical Exam: Blood pressure (!) 159/89, pulse 68, temperature 98.9 F (37.2 C), temperature source Oral, resp. rate 18, height _0  (1.905 m), weight 223 lb 9.6 oz (101.4 kg), SpO2 100 %. ECOG: 0 General appearance: alert and cooperative Head: Normocephalic, without obvious abnormality Neck: no adenopathy Lymph nodes: Cervical, supraclavicular, and axillary nodes normal. Heart:regular rate and rhythm, S1, S2 normal, no murmur, click, rub or gallop Lung:chest clear, no wheezing, rales, normal symmetric air entry Abdomin: soft, non-tender, without masses or organomegaly EXT:no erythema, induration, or nodules   Lab Results: Lab Results  Component Value Date   WBC 4.5 02/26/2016   HGB 17.6 (H) 02/26/2016   HCT 54.0 (H) 02/26/2016   MCV 84.9 02/26/2016   PLT 212 02/26/2016     Chemistry  Component Value Date/Time   NA 140 12/26/2015 1446   K 3.7 12/26/2015 1446   CL 100 (L) 11/10/2015 0936   CO2 25 12/26/2015 1446   BUN 6.6 (L) 12/26/2015 1446   CREATININE 0.7 12/26/2015 1446      Component Value Date/Time   CALCIUM 9.7 12/26/2015 1446   ALKPHOS 40 12/26/2015 1446   AST 13 12/26/2015 1446    ALT 13 12/26/2015 1446   BILITOT 1.30 (H) 12/26/2015 1446          Impression and Plan:   51 year old gentleman with the following issues:  1. Polycythemia presented with a hemoglobin on multiple occasions in September 2017 ranging between 21 and 22. His hematocrit was around 59-60. He had a normal CBC in October 2015 with a hemoglobin of 16.8. His JAK2 is negative.  He status post phlebotomy on November 27 and 01/29/2016. I was well-tolerated.  His hemoglobin today is 17.6 and approaching normal range. He reports no symptoms and feels reasonably well.  His polycythemia is likely secondary to heavy smoking without any clear-cut evidence of a myeloproliferative disorder. I recommended continued observation and surveillance and repeat phlebotomy as needed if his hemoglobin starts to rise again in the future.  2. Thrombosis prophylaxis: I have recommended continuing low-dose aspirin at 81 mg daily.  3. Follow-up: Will be in 3 months to repeat CBC and possible phlebotomy at that time.   [facility], MD 1/8/20188:40 AM

## 2016-06-04 ENCOUNTER — Telehealth: Payer: Self-pay | Admitting: Oncology

## 2016-06-04 ENCOUNTER — Ambulatory Visit: Payer: Self-pay

## 2016-06-04 ENCOUNTER — Other Ambulatory Visit (HOSPITAL_BASED_OUTPATIENT_CLINIC_OR_DEPARTMENT_OTHER): Payer: Self-pay

## 2016-06-04 ENCOUNTER — Ambulatory Visit (HOSPITAL_BASED_OUTPATIENT_CLINIC_OR_DEPARTMENT_OTHER): Payer: Self-pay | Admitting: Oncology

## 2016-06-04 VITALS — BP 174/99 | HR 77 | Temp 98.6°F | Resp 18 | Ht 75.0 in | Wt 229.2 lb

## 2016-06-04 DIAGNOSIS — D751 Secondary polycythemia: Secondary | ICD-10-CM

## 2016-06-04 LAB — CBC WITH DIFFERENTIAL/PLATELET
BASO%: 0.5 % (ref 0.0–2.0)
BASOS ABS: 0 10*3/uL (ref 0.0–0.1)
EOS%: 4.5 % (ref 0.0–7.0)
Eosinophils Absolute: 0.2 10*3/uL (ref 0.0–0.5)
HEMATOCRIT: 55.4 % — AB (ref 38.4–49.9)
HGB: 19.7 g/dL — ABNORMAL HIGH (ref 13.0–17.1)
LYMPH#: 2.2 10*3/uL (ref 0.9–3.3)
LYMPH%: 49.4 % — AB (ref 14.0–49.0)
MCH: 27.7 pg (ref 27.2–33.4)
MCHC: 35.6 g/dL (ref 32.0–36.0)
MCV: 77.8 fL — ABNORMAL LOW (ref 79.3–98.0)
MONO#: 0.5 10*3/uL (ref 0.1–0.9)
MONO%: 11.1 % (ref 0.0–14.0)
NEUT#: 1.5 10*3/uL (ref 1.5–6.5)
NEUT%: 34.5 % — AB (ref 39.0–75.0)
PLATELETS: 159 10*3/uL (ref 140–400)
RBC: 7.12 10*6/uL — ABNORMAL HIGH (ref 4.20–5.82)
RDW: 13.5 % (ref 11.0–14.6)
WBC: 4.4 10*3/uL (ref 4.0–10.3)

## 2016-06-04 NOTE — Progress Notes (Signed)
1425- Attempted to perform phlebotomy on pt RAC (16g) and LAC (18g) but was unsuccessful. Pt refused to have a 3rd iv attempt at this time. He states that he hasn't been drinking enough fluids and it is making it difficult for peripheral access. Notified Dr.Shadad's desk nurse to notify her of pt decision to reschedule phlebotomy. Sent a high priority reschedule message and told pt that they will call him as soon as the appt is set up. Pt verbalized understanding

## 2016-06-04 NOTE — Progress Notes (Signed)
Hematology and Oncology Follow Up Visit  Bruce Cabrera [id number] 12-28-1965 51 y.o. 06/04/2016 2:00 PM No PCP Per PatientNo ref. provider found   Principle Diagnosis: 51 year old male with polycythemia diagnosed in September 2017. He presented with a hemoglobin of 22. His JAK-2 mutation was negative. The etiology of his polycythemia. To be secondary due to heavy smoking.   Prior Therapy: He is status post phlebotomy done in November and December 2017.  Current therapy: Intermittent phlebotomy.  Interim History: Bruce Cabrera presents today for a follow-up visit. Since her last visit, he reports feeling well without recent changes. He remains active and attends to activities of daily living. He tolerated phlebotomy in the past on multiple occasions which have helped his symptoms. He continues to smoke however although has cut down significantly. He did not report any thrombotic or bleeding episodes. He denied any recent hospitalization or illnesses. He denied any angina or dyspnea on exertion.   He denies any headaches, blurry vision, syncope or seizure or any other neurological symptoms. He does not report any chest pain, palpitation, orthopnea or leg edema. He does not report any fevers, chills, sweats, weight loss or constitutional symptoms. He does not report any cough, wheezing or hemoptysis. He does not report any nausea, vomiting or abdominal pain. He does not report any frequency urgency or hesitancy. He does not report any skeletal complaints. Remaining review of systems unremarkable.    Medications: I have reviewed the patient's current medications.  Current Outpatient Prescriptions  Medication Sig Dispense Refill  . aspirin EC 81 MG tablet Take 1 tablet (81 mg total) by mouth daily. 30 tablet 11  . Blood Glucose Monitoring Suppl (TRUE METRIX METER) w/Device KIT Test fasting blood sugars and at bedtime 1 kit 0  . glucose blood (TRUE METRIX BLOOD GLUCOSE TEST) test strip Use as instructed  100 each 12  . losartan (COZAAR) 100 MG tablet Take 0.5 tablets (50 mg total) by mouth daily. 45 tablet 1  . metFORMIN (GLUCOPHAGE) 1000 MG tablet Take 1 tablet (1,000 mg total) by mouth 2 (two) times daily with a meal. 180 tablet 3  . metoprolol tartrate (LOPRESSOR) 25 MG tablet Take 1 tablet (25 mg total) by mouth 2 (two) times daily. 60 tablet 3  . nitroGLYCERIN (NITROSTAT) 0.4 MG SL tablet Place 1 tablet (0.4 mg total) under the tongue every 5 (five) minutes as needed for chest pain. (Patient not taking: Reported on 12/26/2015) 25 tablet 3  . TRUEPLUS LANCETS 28G MISC Use as directed 1 each 3   No current facility-administered medications for this visit.      Allergies: No Known Allergies  Past Medical History, Surgical history, Social history, and Family History were reviewed and updated.  Physical Exam: Blood pressure (!) 174/99, pulse 77, temperature 98.6 F (37 C), temperature source Oral, resp. rate 18, height 6' 3"  (1.905 m), weight 229 lb 3.2 oz (104 kg), SpO2 99 %. ECOG: 0 General appearance: alert and cooperative appeared without distress. Head: Normocephalic, without obvious abnormality without oral ulcers or thrush. Neck: no adenopathy Lymph nodes: Cervical, supraclavicular, and axillary nodes normal. Heart:regular rate and rhythm, S1, S2 normal, no murmur, click, rub or gallop Lung:chest clear, no wheezing, rales, normal symmetric air entry Abdomin: soft, non-tender, without masses or organomegaly no shifting dullness or ascites. EXT:no erythema, induration, or nodules   Lab Results: Lab Results  Component Value Date   WBC 4.4 06/04/2016   HGB 19.7 (H) 06/04/2016   HCT 55.4 (H) 06/04/2016  MCV 77.8 (L) 06/04/2016   PLT 159 06/04/2016     Chemistry      Component Value Date/Time   NA 140 12/26/2015 1446   K 3.7 12/26/2015 1446   CL 100 (L) 11/10/2015 0936   CO2 25 12/26/2015 1446   BUN 6.6 (L) 12/26/2015 1446   CREATININE 0.7 12/26/2015 1446       Component Value Date/Time   CALCIUM 9.7 12/26/2015 1446   ALKPHOS 40 12/26/2015 1446   AST 13 12/26/2015 1446   ALT 13 12/26/2015 1446   BILITOT 1.30 (H) 12/26/2015 1446          Impression and Plan:   51 year old gentleman with the following issues:  1. Polycythemia presented with a hemoglobin on multiple occasions in September 2017 ranging between 21 and 22. His hematocrit was around 59-60. He had a normal CBC in October 2015 with a hemoglobin of 16.8. His JAK2 is negative.  He status post phlebotomy on multiple occasions including December 2017.  His hemoglobin today is elevated again at 19.7 although he is mildly symptomatic.  Risks and benefits of repeat phlebotomy was discussed today and he is agreeable to receive it today. He felt slightly better and has no objections to proceed. The plan is to continue with intermittent phlebotomy to keep his hemoglobin close to 17.  2. Thrombosis prophylaxis: I have recommended continuing low-dose aspirin at 81 mg daily.  3. Follow-up: Will be in 4 months to repeat CBC and possible phlebotomy at that time.   Zola Button, MD 4/17/20182:00 PM

## 2016-06-04 NOTE — Telephone Encounter (Signed)
Appointments scheduled per 06/04/16 los. Date & time selected, per patient preference. Patient was given a copy of the AVS report and appointment schedule per 06/04/16 los.

## 2016-06-05 ENCOUNTER — Telehealth: Payer: Self-pay | Admitting: Oncology

## 2016-06-05 NOTE — Telephone Encounter (Signed)
sw pt to confirm r/s phlebotomy appt 4/23 at 0830 per LOS

## 2016-06-15 ENCOUNTER — Telehealth: Payer: Self-pay | Admitting: Oncology

## 2016-06-15 NOTE — Telephone Encounter (Signed)
Left message for patient re 5/2. Appointment rescheduled from 4/23 per patient request.

## 2016-06-19 ENCOUNTER — Ambulatory Visit: Payer: Self-pay

## 2016-06-19 VITALS — BP 172/98 | HR 74 | Temp 98.6°F | Resp 20

## 2016-06-19 DIAGNOSIS — D751 Secondary polycythemia: Secondary | ICD-10-CM

## 2016-06-19 NOTE — Patient Instructions (Signed)
     Therapeutic Phlebotomy, Care After Refer to this sheet in the next few weeks. These instructions provide you with information about caring for yourself after your procedure. Your health care provider may also give you more specific instructions. Your treatment has been planned according to current medical practices, but problems sometimes occur. Call your health care provider if you have any problems or questions after your procedure. What can I expect after the procedure? After the procedure, it is common to have:  Light-headedness or dizziness. You may feel faint.  Nausea.  Tiredness. Follow these instructions at home: Activity  Return to your normal activities as directed by your health care provider. Most people can go back to their normal activities right away.  Avoid strenuous physical activity and heavy lifting or pulling for about 5 hours after the procedure. Do not lift anything that is heavier than 10 lb (4.5 kg).  Athletes should avoid strenuous exercise for at least 12 hours.  Change positions slowly for the remainder of the day. This will help to prevent light-headedness or fainting.  If you feel light-headed, lie down until the feeling goes away. Eating and drinking  Be sure to eat well-balanced meals for the next 24 hours.  Drink enough fluid to keep your urine clear or pale yellow.  Avoid drinking alcohol on the day that you had the procedure. Care of the Needle Insertion Site  Keep your bandage dry. You can remove the bandage after about 5 hours or as directed by your health care provider.  If you have bleeding from the needle insertion site, elevate your arm and press firmly on the site until the bleeding stops.  If you have bruising at the site, apply ice to the area:  Put ice in a plastic bag.  Place a towel between your skin and the bag.  Leave the ice on for 20 minutes, 2-3 times a day for the first 24 hours.  If the swelling does not go away  after 24 hours, apply a warm, moist washcloth to the area for 20 minutes, 2-3 times a day. General instructions  Avoid smoking for at least 30 minutes after the procedure.  Keep all follow-up visits as directed by your health care provider. It is important to continue with further therapeutic phlebotomy treatments as directed. Contact a health care provider if:  You have redness, swelling, or pain at the needle insertion site.  You have fluid, blood, or pus coming from the needle insertion site.  You feel light-headed, dizzy, or nauseated, and the feeling does not go away.  You notice new bruising at the needle insertion site.  You feel weaker than normal.  You have a fever or chills. Get help right away if:  You have severe nausea or vomiting.  You have chest pain.  You have trouble breathing. This information is not intended to replace advice given to you by your health care provider. Make sure you discuss any questions you have with your health care provider. Document Released: 07/09/2010 Document Revised: 10/07/2015 Document Reviewed: 01/31/2014 Elsevier Interactive Patient Education  2017 Elsevier Inc.  

## 2016-06-19 NOTE — Progress Notes (Signed)
Per Dr. Alen Blew, go ahead and do the phlebotomy today. Infusion room nurse notified.

## 2016-06-19 NOTE — Progress Notes (Signed)
Per Dr. Alen Blew okay to tx without recent Hgb.

## 2016-10-04 ENCOUNTER — Ambulatory Visit: Payer: Self-pay | Admitting: Oncology

## 2016-10-04 ENCOUNTER — Other Ambulatory Visit: Payer: Self-pay

## 2016-10-08 ENCOUNTER — Telehealth: Payer: Self-pay | Admitting: Oncology

## 2016-10-08 NOTE — Telephone Encounter (Signed)
lvm to inform pt of 9/5 appt at 0800 per sch msg

## 2016-10-23 ENCOUNTER — Ambulatory Visit: Payer: Self-pay | Admitting: Oncology

## 2016-10-23 ENCOUNTER — Other Ambulatory Visit: Payer: Self-pay

## 2016-11-26 ENCOUNTER — Telehealth: Payer: Self-pay | Admitting: Oncology

## 2016-11-26 NOTE — Telephone Encounter (Signed)
Patient scheduled appointment 10/15.

## 2016-12-02 ENCOUNTER — Other Ambulatory Visit: Payer: Self-pay | Admitting: *Deleted

## 2016-12-02 ENCOUNTER — Ambulatory Visit (HOSPITAL_BASED_OUTPATIENT_CLINIC_OR_DEPARTMENT_OTHER): Payer: Self-pay

## 2016-12-02 ENCOUNTER — Telehealth: Payer: Self-pay | Admitting: Oncology

## 2016-12-02 ENCOUNTER — Ambulatory Visit (HOSPITAL_BASED_OUTPATIENT_CLINIC_OR_DEPARTMENT_OTHER): Payer: Self-pay | Admitting: Oncology

## 2016-12-02 VITALS — BP 151/92 | HR 63 | Temp 98.1°F | Resp 20 | Ht 75.0 in | Wt 231.9 lb

## 2016-12-02 VITALS — BP 148/80 | HR 53 | Temp 97.7°F | Resp 18

## 2016-12-02 DIAGNOSIS — D751 Secondary polycythemia: Secondary | ICD-10-CM

## 2016-12-02 DIAGNOSIS — F17200 Nicotine dependence, unspecified, uncomplicated: Secondary | ICD-10-CM

## 2016-12-02 LAB — CBC WITH DIFFERENTIAL/PLATELET
BASO%: 0.4 % (ref 0.0–2.0)
Basophils Absolute: 0 10*3/uL (ref 0.0–0.1)
EOS%: 4.4 % (ref 0.0–7.0)
Eosinophils Absolute: 0.2 10*3/uL (ref 0.0–0.5)
HCT: 43.7 % (ref 38.4–49.9)
HEMOGLOBIN: 15 g/dL (ref 13.0–17.1)
LYMPH%: 43.2 % (ref 14.0–49.0)
MCH: 27.2 pg (ref 27.2–33.4)
MCHC: 34.3 g/dL (ref 32.0–36.0)
MCV: 79.3 fL (ref 79.3–98.0)
MONO#: 0.4 10*3/uL (ref 0.1–0.9)
MONO%: 8.3 % (ref 0.0–14.0)
NEUT%: 43.7 % (ref 39.0–75.0)
NEUTROS ABS: 2.3 10*3/uL (ref 1.5–6.5)
Platelets: 210 10*3/uL (ref 140–400)
RBC: 5.51 10*6/uL (ref 4.20–5.82)
RDW: 13.4 % (ref 11.0–14.6)
WBC: 5.2 10*3/uL (ref 4.0–10.3)
lymph#: 2.3 10*3/uL (ref 0.9–3.3)

## 2016-12-02 NOTE — Progress Notes (Signed)
Hematology and Oncology Follow Up Visit  Bruce Cabrera [id number] [birth year] 51 y.o. 12/02/2016 8:21 AM Patient, No Pcp PerNo ref. provider found   Principle Diagnosis: 51 year old male with polycythemia diagnosed in September 2017. He presented with a hemoglobin of 22. His JAK-2 mutation was negative. The etiology of his polycythemia. To be secondary due to heavy smoking.   Prior Therapy: He is status post phlebotomy done in November and December 2017.  Current therapy: Intermittent phlebotomy.  Interim History: Mr. Bowden presents today for a follow-up visit. Since her last visit, he reports no changes in his health. He remains active and continues to work full time. He missed his phlebotomy appointments because of work schedule. He tolerated phlebotomy in the past on multiple occasions which have helped his symptoms. He continues to smoke. He did not report any thrombotic or bleeding episodes. He denied any recent hospitalization or illnesses. He denied any angina or dyspnea on exertion. 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   He denies any headaches, blurry vision, syncope or seizure or any other neurological symptoms. He does not report any chest pain, palpitation, orthopnea or leg edema. He does not report any fevers, chills, sweats, weight loss or constitutional symptoms. He does not report any cough, wheezing or hemoptysis. He does not report any nausea, vomiting or abdominal pain. He does not report any frequency urgency or hesitancy. He does not report any skeletal complaints. Remaining review of systems unremarkable.    Medications: I have reviewed the patient's current medications.  Current Outpatient Prescriptions  Medication Sig Dispense Refill  . aspirin EC 81 MG tablet Take 1 tablet (81 mg total) by mouth daily. 30 tablet 11  . Blood Glucose Monitoring Suppl (TRUE METRIX METER) w/Device KIT Test fasting blood sugars and at bedtime 1 kit 0  . glucose blood (TRUE METRIX  BLOOD GLUCOSE TEST) test strip Use as instructed 100 each 12  . losartan (COZAAR) 100 MG tablet Take 0.5 tablets (50 mg total) by mouth daily. 45 tablet 1  . metFORMIN (GLUCOPHAGE) 1000 MG tablet Take 1 tablet (1,000 mg total) by mouth 2 (two) times daily with a meal. 180 tablet 3  . metoprolol tartrate (LOPRESSOR) 25 MG tablet Take 1 tablet (25 mg total) by mouth 2 (two) times daily. 60 tablet 3  . nitroGLYCERIN (NITROSTAT) 0.4 MG SL tablet Place 1 tablet (0.4 mg total) under the tongue every 5 (five) minutes as needed for chest pain. 25 tablet 3  . TRUEPLUS LANCETS 28G MISC Use as directed 1 each 3   No current facility-administered medications for this visit.      Allergies: No Known Allergies  Past Medical History, Surgical history, Social history, and Family History were reviewed and updated.  Physical Exam: Blood pressure (!) 151/92, pulse 63, temperature 98.1 F (36.7 C), temperature source Oral, resp. rate 20, height 6' 3" (1.905 m), weight 231 lb 14.4 oz (105.2 kg), SpO2 100 %. ECOG: 0 General appearance: well-appearing gentleman without distress. Head: Normocephalic, without obvious abnormality without thrush or ulcers. Neck: no adenopathy or masses. Lymph nodes: Cervical, supraclavicular, and axillary nodes normal. Heart:regular rate and rhythm, S1, S2 normal, no murmur, click, rub or gallop Lung:chest clear, no wheezing, rales, normal symmetric air entry Abdomin: soft, non-tender, without masses or organomegaly no rebound or guarding. EXT:no erythema, induration, or nodules   Lab Results: Lab Results  Component Value Date   WBC 4.4 06/04/2016   HGB 19.7 (H) 06/04/2016   HCT 55.4 (H) 06/04/2016  MCV 77.8 (L) 06/04/2016   PLT 159 06/04/2016     Chemistry      Component Value Date/Time   NA 140 12/26/2015 1446   K 3.7 12/26/2015 1446   CL 100 (L) 11/10/2015 0936   CO2 25 12/26/2015 1446   BUN 6.6 (L) 12/26/2015 1446   CREATININE 0.7 12/26/2015 1446       Component Value Date/Time   CALCIUM 9.7 12/26/2015 1446   ALKPHOS 40 12/26/2015 1446   AST 13 12/26/2015 1446   ALT 13 12/26/2015 1446   BILITOT 1.30 (H) 12/26/2015 1446          Impression and Plan:   50-year-old gentleman with the following issues:  1. Polycythemia presented with a hemoglobin on multiple occasions in September 2017 ranging between 21 and 22. His hematocrit was around 59-60. He had a normal CBC in October 2015 with a hemoglobin of 16.8. His JAK2 is negative.  He status post phlebotomy on multiple occasions including December 2017.  Risks and benefits of repeat phlebotomy was discussed today and he is agreeable to receive it today. His CBC is currently pending but he will likely require phlebotomy today.  The plan to proceed with phlebotomy every 2 weeks for the next 3 months to keep his hemoglobin though 17. We will taper him off after that.  2. Thrombosis prophylaxis: I have recommended continuing low-dose aspirin at 81 mg daily.  3. Follow-up: Will be in 3 months to assess his response to phlebotomy at that time.   Firas Shadad, MD 10/15/20188:21 AM 

## 2016-12-02 NOTE — Patient Instructions (Signed)
     Therapeutic Phlebotomy, Care After Refer to this sheet in the next few weeks. These instructions provide you with information about caring for yourself after your procedure. Your health care provider may also give you more specific instructions. Your treatment has been planned according to current medical practices, but problems sometimes occur. Call your health care provider if you have any problems or questions after your procedure. What can I expect after the procedure? After the procedure, it is common to have:  Light-headedness or dizziness. You may feel faint.  Nausea.  Tiredness. Follow these instructions at home: Activity  Return to your normal activities as directed by your health care provider. Most people can go back to their normal activities right away.  Avoid strenuous physical activity and heavy lifting or pulling for about 5 hours after the procedure. Do not lift anything that is heavier than 10 lb (4.5 kg).  Athletes should avoid strenuous exercise for at least 12 hours.  Change positions slowly for the remainder of the day. This will help to prevent light-headedness or fainting.  If you feel light-headed, lie down until the feeling goes away. Eating and drinking  Be sure to eat well-balanced meals for the next 24 hours.  Drink enough fluid to keep your urine clear or pale yellow.  Avoid drinking alcohol on the day that you had the procedure. Care of the Needle Insertion Site  Keep your bandage dry. You can remove the bandage after about 5 hours or as directed by your health care provider.  If you have bleeding from the needle insertion site, elevate your arm and press firmly on the site until the bleeding stops.  If you have bruising at the site, apply ice to the area:  Put ice in a plastic bag.  Place a towel between your skin and the bag.  Leave the ice on for 20 minutes, 2-3 times a day for the first 24 hours.  If the swelling does not go away  after 24 hours, apply a warm, moist washcloth to the area for 20 minutes, 2-3 times a day. General instructions  Avoid smoking for at least 30 minutes after the procedure.  Keep all follow-up visits as directed by your health care provider. It is important to continue with further therapeutic phlebotomy treatments as directed. Contact a health care provider if:  You have redness, swelling, or pain at the needle insertion site.  You have fluid, blood, or pus coming from the needle insertion site.  You feel light-headed, dizzy, or nauseated, and the feeling does not go away.  You notice new bruising at the needle insertion site.  You feel weaker than normal.  You have a fever or chills. Get help right away if:  You have severe nausea or vomiting.  You have chest pain.  You have trouble breathing. This information is not intended to replace advice given to you by your health care provider. Make sure you discuss any questions you have with your health care provider. Document Released: 07/09/2010 Document Revised: 10/07/2015 Document Reviewed: 01/31/2014 Elsevier Interactive Patient Education  2017 Elsevier Inc.  

## 2016-12-02 NOTE — Progress Notes (Unsigned)
Labs reviewed by MD, Proceed with Phlebotomy despite pt below parameters.

## 2016-12-02 NOTE — Telephone Encounter (Signed)
Scheduled appt per 10/15 los - Gave patient AVS and calender per los.  

## 2016-12-02 NOTE — Progress Notes (Unsigned)
Pt offered food & fluids before IV stick, accepted.  Bruce Cabrera  495 ml blood drained.  Offered sandwich, fluids, and chips after phlebotomy, accepted.  A&Ox4.  Denies weakness/dizziness.  VS stable.  Monitored for 30 min period.

## 2016-12-16 ENCOUNTER — Other Ambulatory Visit: Payer: Self-pay

## 2016-12-30 ENCOUNTER — Other Ambulatory Visit (HOSPITAL_BASED_OUTPATIENT_CLINIC_OR_DEPARTMENT_OTHER): Payer: Self-pay

## 2016-12-30 ENCOUNTER — Ambulatory Visit: Payer: Self-pay

## 2016-12-30 DIAGNOSIS — D751 Secondary polycythemia: Secondary | ICD-10-CM

## 2016-12-30 LAB — CBC WITH DIFFERENTIAL/PLATELET
BASO%: 0.7 % (ref 0.0–2.0)
Basophils Absolute: 0 10*3/uL (ref 0.0–0.1)
EOS%: 6.2 % (ref 0.0–7.0)
Eosinophils Absolute: 0.3 10*3/uL (ref 0.0–0.5)
HEMATOCRIT: 39.9 % (ref 38.4–49.9)
HEMOGLOBIN: 13 g/dL (ref 13.0–17.1)
LYMPH#: 2 10*3/uL (ref 0.9–3.3)
LYMPH%: 44.5 % (ref 14.0–49.0)
MCH: 26.3 pg — ABNORMAL LOW (ref 27.2–33.4)
MCHC: 32.6 g/dL (ref 32.0–36.0)
MCV: 80.7 fL (ref 79.3–98.0)
MONO#: 0.4 10*3/uL (ref 0.1–0.9)
MONO%: 9.4 % (ref 0.0–14.0)
NEUT%: 39.2 % (ref 39.0–75.0)
NEUTROS ABS: 1.8 10*3/uL (ref 1.5–6.5)
PLATELETS: 200 10*3/uL (ref 140–400)
RBC: 4.95 10*6/uL (ref 4.20–5.82)
RDW: 13.7 % (ref 11.0–14.6)
WBC: 4.6 10*3/uL (ref 4.0–10.3)

## 2016-12-30 NOTE — Progress Notes (Signed)
Per Dr Alen Blew no phlebotomy today. Pt feels well. Pt was given a copy of his labs and discharged to home.

## 2017-01-13 ENCOUNTER — Other Ambulatory Visit: Payer: Self-pay

## 2017-01-27 ENCOUNTER — Other Ambulatory Visit: Payer: Self-pay

## 2017-02-10 ENCOUNTER — Other Ambulatory Visit: Payer: Self-pay

## 2017-03-03 ENCOUNTER — Ambulatory Visit: Payer: Self-pay | Admitting: Oncology

## 2017-03-03 ENCOUNTER — Other Ambulatory Visit: Payer: Self-pay

## 2017-05-16 ENCOUNTER — Other Ambulatory Visit: Payer: Self-pay

## 2017-05-16 ENCOUNTER — Emergency Department (HOSPITAL_COMMUNITY): Payer: Self-pay

## 2017-05-16 ENCOUNTER — Encounter (HOSPITAL_COMMUNITY): Payer: Self-pay | Admitting: Emergency Medicine

## 2017-05-16 DIAGNOSIS — Z7984 Long term (current) use of oral hypoglycemic drugs: Secondary | ICD-10-CM | POA: Insufficient documentation

## 2017-05-16 DIAGNOSIS — R079 Chest pain, unspecified: Secondary | ICD-10-CM | POA: Insufficient documentation

## 2017-05-16 DIAGNOSIS — F17210 Nicotine dependence, cigarettes, uncomplicated: Secondary | ICD-10-CM | POA: Insufficient documentation

## 2017-05-16 DIAGNOSIS — I10 Essential (primary) hypertension: Secondary | ICD-10-CM | POA: Insufficient documentation

## 2017-05-16 DIAGNOSIS — M542 Cervicalgia: Secondary | ICD-10-CM | POA: Insufficient documentation

## 2017-05-16 DIAGNOSIS — Z7982 Long term (current) use of aspirin: Secondary | ICD-10-CM | POA: Insufficient documentation

## 2017-05-16 DIAGNOSIS — Z79899 Other long term (current) drug therapy: Secondary | ICD-10-CM | POA: Insufficient documentation

## 2017-05-16 DIAGNOSIS — Z8249 Family history of ischemic heart disease and other diseases of the circulatory system: Secondary | ICD-10-CM | POA: Insufficient documentation

## 2017-05-16 DIAGNOSIS — E119 Type 2 diabetes mellitus without complications: Secondary | ICD-10-CM | POA: Insufficient documentation

## 2017-05-16 LAB — BASIC METABOLIC PANEL
Anion gap: 10 (ref 5–15)
BUN: 11 mg/dL (ref 6–20)
CALCIUM: 9.3 mg/dL (ref 8.9–10.3)
CO2: 22 mmol/L (ref 22–32)
CREATININE: 0.84 mg/dL (ref 0.61–1.24)
Chloride: 104 mmol/L (ref 101–111)
GFR calc Af Amer: >60 mL/min (ref >60)
GFR calc non Af Amer: >60 mL/min (ref >60)
GLUCOSE: 106 mg/dL — AB (ref 65–99)
Potassium: 3.7 mmol/L (ref 3.5–5.1)
Sodium: 136 mmol/L (ref 135–145)

## 2017-05-16 LAB — CBC
HCT: 40 % (ref 39.0–52.0)
HEMOGLOBIN: 13.8 g/dL (ref 13.0–17.0)
MCH: 26.4 pg (ref 26.0–34.0)
MCHC: 34.5 g/dL (ref 30.0–36.0)
MCV: 76.5 fL — ABNORMAL LOW (ref 78.0–100.0)
PLATELETS: 226 10*3/uL (ref 150–400)
RBC: 5.23 MIL/uL (ref 4.22–5.81)
RDW: 15.2 % (ref 11.5–15.5)
WBC: 6 10*3/uL (ref 4.0–10.5)

## 2017-05-16 LAB — I-STAT TROPONIN, ED: TROPONIN I, POC: 0 ng/mL (ref 0.00–0.08)

## 2017-05-16 NOTE — ED Triage Notes (Signed)
Pt to ED via [facility].  Reports slight sharp L shoulder pain when he woke up at 5:30am.  Went to work and had increased pain and pain in L chest that radiated down L arm when he laughed around 11:30am.  Pt went home and went to bed.  Pain continued when he woke up so he called EMS.  Per EMS, pt diaphoretic when he sat up.  ASA 324mg  and NTG SL x1 given pta.  Denies sob, nausea, and vomiting.

## 2017-05-17 ENCOUNTER — Emergency Department (HOSPITAL_COMMUNITY)
Admission: EM | Admit: 2017-05-17 | Discharge: 2017-05-17 | Disposition: A | Discharge status: Home / Self Care | Payer: Self-pay | Attending: Emergency Medicine | Admitting: Emergency Medicine

## 2017-05-17 DIAGNOSIS — M542 Cervicalgia: Secondary | ICD-10-CM

## 2017-05-17 DIAGNOSIS — M791 Myalgia, unspecified site: Secondary | ICD-10-CM

## 2017-05-17 DIAGNOSIS — R079 Chest pain, unspecified: Secondary | ICD-10-CM

## 2017-05-17 LAB — I-STAT TROPONIN, ED: Troponin i, poc: 0 ng/mL (ref 0.00–0.08)

## 2017-05-17 MED ORDER — METFORMIN HCL 500 MG PO TABS: 500.0000 mg | ORAL_TABLET | Freq: Two times a day (BID) | ORAL | 0 refills | Status: DC

## 2017-05-17 MED ORDER — AMLODIPINE BESYLATE 5 MG PO TABS: 5.0000 mg | ORAL_TABLET | Freq: Every day | ORAL | 0 refills | Status: DC

## 2017-05-17 MED ORDER — METOPROLOL TARTRATE 25 MG PO TABS: 25.0000 mg | ORAL_TABLET | Freq: Two times a day (BID) | ORAL | 0 refills | Status: DC

## 2017-05-17 MED ORDER — CYCLOBENZAPRINE HCL 10 MG PO TABS: 10.0000 mg | ORAL_TABLET | Freq: Two times a day (BID) | ORAL | 0 refills | Status: DC | PRN

## 2017-05-17 NOTE — ED Provider Notes (Signed)
[facility] EMERGENCY DEPARTMENT Provider Note   CSN: [id number] Arrival date & time: 05/16/17  2146     History   Chief Complaint Chief Complaint  Patient presents with  . Chest Pain    HPI AXCEL HORSCH is a 52 y.o. male.  HPI   Yesterday woke up with left hand numbness, then went to work and had neck pain and shoulder pain. Left hand numb but no arm numbness.   Hurt to move neck too. Tried tylenol that didn't help.  Now pain has been constant since yesterday morning.  Feels knot on left shoulder which is new.  No injuries.  Last night woke up with some chest pain, felt like a squeezing, has family hx of CHF. Nervous given numbness in hand.  Lasted until he got the nitroglycerin. On the way here received nitro.  Since woke up 4-5PM had it until 845PM.  No chest pain now overnight.  No nausea, no vomiting, no dyspnea.  Had hx of CP like this 2015 with Dr. 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    Past Medical History:  Diagnosis Date  . Diabetes mellitus without complication ([place])   . Hyperlipidemia associated with type 2 diabetes mellitus ([place])    based on 2011 profile  . Hypertension     Patient Active Problem List   Diagnosis Date Noted  . Polycythemia 12/26/2015  . Anginal chest pain at rest [facility]) 11/25/2013  . Diabetes mellitus ([place]) 11/25/2013  . DIABETES MELLITUS, TYPE II 10/13/2006  . HYPERLIPIDEMIA 10/13/2006  . Essential hypertension 10/13/2006  . OBESITY 10/10/2006    Past Surgical History:  Procedure Laterality Date  . LEFT HEART CATHETERIZATION WITH CORONARY ANGIOGRAM N/A 11/29/2013   Procedure: LEFT HEART CATHETERIZATION WITH CORONARY ANGIOGRAM;  Surgeon: Troy Sine, MD;  Location: [facility] CATH LAB;  Service: Cardiovascular;  Laterality: N/A;  . None          Home Medications    Prior to Admission medications   Medication Sig Start Date End Date Taking? Authorizing Provider  amLODipine (NORVASC) 5 MG tablet Take 1  tablet (5 mg total) by mouth daily. 05/17/17   Gareth Morgan, MD  aspirin EC 81 MG tablet Take 1 tablet (81 mg total) by mouth daily. 11/25/13   Gustavus Bryant, MD  Blood Glucose Monitoring Suppl (TRUE METRIX METER) w/Device KIT Test fasting blood sugars and at bedtime 11/17/15   Argentina Donovan, PA-C  cyclobenzaprine (FLEXERIL) 10 MG tablet Take 1 tablet (10 mg total) by mouth 2 (two) times daily as needed for muscle spasms. 05/17/17   Gareth Morgan, MD  glucose blood (TRUE METRIX BLOOD GLUCOSE TEST) test strip Use as instructed 11/17/15   Argentina Donovan, PA-C  losartan (COZAAR) 100 MG tablet Take 0.5 tablets (50 mg total) by mouth daily. 11/17/15   Argentina Donovan, PA-C  metFORMIN (GLUCOPHAGE) 1000 MG tablet Take 1 tablet (1,000 mg total) by mouth 2 (two) times daily with a meal. 11/17/15   McClung, Dionne Bucy, PA-C  metFORMIN (GLUCOPHAGE) 500 MG tablet Take 1 tablet (500 mg total) by mouth 2 (two) times daily. 05/17/17   Gareth Morgan, MD  metoprolol tartrate (LOPRESSOR) 25 MG tablet Take 1 tablet (25 mg total) by mouth 2 (two) times daily. 11/17/15   Argentina Donovan, PA-C  metoprolol tartrate (LOPRESSOR) 25 MG tablet Take 1 tablet (25 mg total) by mouth 2 (two) times daily. 05/17/17   Gareth Morgan, MD  nitroGLYCERIN (NITROSTAT) 0.4  MG SL tablet Place 1 tablet (0.4 mg total) under the tongue every 5 (five) minutes as needed for chest pain. 11/25/13   Gustavus Bryant, MD  TRUEPLUS LANCETS 28G MISC Use as directed 11/17/15   Argentina Donovan, PA-C    Family History Family History  Problem Relation Age of Onset  . Heart attack Mother 60  . Heart failure Brother 16    Social History Social History   Tobacco Use  . Smoking status: Current Every Day Smoker    Packs/day: 0.50    Years: 30.00    Pack years: 15.00    Types: Cigarettes  . Smokeless tobacco: Never Used  Substance Use Topics  . Alcohol use: Yes    Comment: 2- tallboys a day  . Drug use: No     Allergies     Patient has no known allergies.   Review of Systems Review of Systems  Constitutional: Negative for fever.  HENT: Negative for sore throat.   Eyes: Negative for visual disturbance.  Respiratory: Negative for shortness of breath.   Cardiovascular: Positive for chest pain.  Gastrointestinal: Negative for abdominal pain, nausea and vomiting.  Genitourinary: Negative for difficulty urinating.  Musculoskeletal: Positive for arthralgias and neck pain. Negative for back pain and neck stiffness.  Skin: Negative for rash.  Neurological: Positive for light-headedness. Negative for syncope and headaches.     Physical Exam Updated Vital Signs BP (!) 180/86 (BP Location: Left Arm)   Pulse 60   Temp 97.8 F (36.6 C) (Oral)   Resp 16   SpO2 99%   Physical Exam  Constitutional: He is oriented to person, place, and time. He appears well-developed and well-nourished. No distress.  HENT:  Head: Normocephalic and atraumatic.  Eyes: Conjunctivae and EOM are normal.  Neck: Normal range of motion.  Cardiovascular: Normal rate, regular rhythm, normal heart sounds and intact distal pulses. Exam reveals no gallop and no friction rub.  No murmur heard. Pulmonary/Chest: Effort normal and breath sounds normal. No respiratory distress. He has no wheezes. He has no rales.  Abdominal: Soft. He exhibits no distension. There is no tenderness. There is no guarding.  Musculoskeletal: He exhibits no edema.  Neurological: He is alert and oriented to person, place, and time.  Skin: Skin is warm and dry. He is not diaphoretic.  Nursing note and vitals reviewed.    ED Treatments / Results  Labs (all labs ordered are listed, but only abnormal results are displayed) Labs Reviewed  BASIC METABOLIC PANEL - Abnormal; Notable for the following components:      Result Value   Glucose, Bld 106 (*)    All other components within normal limits  CBC - Abnormal; Notable for the following components:   MCV 76.5 (*)     All other components within normal limits  I-STAT TROPONIN, ED  I-STAT TROPONIN, ED    EKG EKG Interpretation  Date/Time:  Friday May 16 2017 21:51:16 EDT Ventricular Rate:  75 PR Interval:  174 QRS Duration: 106 QT Interval:  394 QTC Calculation: 439 R Axis:   -78 Text Interpretation:  Normal sinus rhythm Left anterior fascicular block Septal infarct , age undetermined Abnormal ECG No significant change since last tracing Confirmed by Gareth Morgan [id number]) on 05/17/2017 8:05:58 AM   Radiology Dg Chest 2 View  Result Date: 05/16/2017 CLINICAL DATA:  Chest pain EXAM: CHEST - 2 VIEW COMPARISON:  Chest radiograph 11/10/2015 FINDINGS: The heart size and mediastinal contours are within normal limits. Both lungs  are clear. The visualized skeletal structures are unremarkable. IMPRESSION: No active cardiopulmonary disease. Electronically Signed   By: Ulyses Jarred M.D.   On: 05/16/2017 22:49    Procedures Procedures (including critical care time)  Medications Ordered in ED Medications - No data to display   Initial Impression / Assessment and Plan / ED Course  I have reviewed the triage vital signs and the nursing notes.  Pertinent labs & imaging results that were available during my care of the patient were reviewed by me and considered in my medical decision making (see chart for details).     52 year old male with a history above presents with concern for left hand numbness, left neck and shoulder pain.  Patient with strong pulses in the upper extremities bilaterally, no signs of acute arterial thrombosis, and have low suspicion given the appearance of x-ray, vital signs and history for aortic dissection.  Reported an episode of chest pain which has since resolved.  Patient had delta troponins which were both negative, and with hx not typical of angina.  Patient's primary concern with left-sided neck pain, which is significantly reproducible, and worsened with movements.   Feel this most likely represents muscular strain, however other etiologies include possible cervical disc disease with radicular pain.  Recommend follow-up with primary care physician for further care.  Given a prescription for Flexeril. Low suspicion for ACS, PE, dissection, cord compression, fracture.  Patient discharged in stable condition with understanding of reasons to return.   Final Clinical Impressions(s) / ED Diagnoses   Final diagnoses:  Chest pain, unspecified type  Neck pain on left side  Muscular pain    ED Discharge Orders        Ordered    cyclobenzaprine (FLEXERIL) 10 MG tablet  2 times daily PRN     05/17/17 0819    metFORMIN (GLUCOPHAGE) 500 MG tablet  2 times daily     05/17/17 0821    metoprolol tartrate (LOPRESSOR) 25 MG tablet  2 times daily     05/17/17 0821    amLODipine (NORVASC) 5 MG tablet  Daily     05/17/17 3474       Gareth Morgan, MD 05/18/17 2157

## 2018-01-09 ENCOUNTER — Other Ambulatory Visit: Payer: Self-pay | Admitting: Podiatry

## 2018-01-09 ENCOUNTER — Encounter: Payer: Self-pay | Admitting: Podiatry

## 2018-01-09 ENCOUNTER — Ambulatory Visit (INDEPENDENT_AMBULATORY_CARE_PROVIDER_SITE_OTHER): Payer: BLUE CROSS/BLUE SHIELD

## 2018-01-09 ENCOUNTER — Ambulatory Visit (INDEPENDENT_AMBULATORY_CARE_PROVIDER_SITE_OTHER): Payer: BLUE CROSS/BLUE SHIELD | Admitting: Podiatry

## 2018-01-09 VITALS — BP 147/88 | HR 72 | Resp 16

## 2018-01-09 DIAGNOSIS — M778 Other enthesopathies, not elsewhere classified: Secondary | ICD-10-CM

## 2018-01-09 DIAGNOSIS — M779 Enthesopathy, unspecified: Secondary | ICD-10-CM

## 2018-01-09 DIAGNOSIS — M7750 Other enthesopathy of unspecified foot: Secondary | ICD-10-CM | POA: Diagnosis not present

## 2018-01-09 DIAGNOSIS — M722 Plantar fascial fibromatosis: Secondary | ICD-10-CM

## 2018-01-09 DIAGNOSIS — M7752 Other enthesopathy of left foot: Secondary | ICD-10-CM

## 2018-01-09 MED ORDER — PREDNISONE 10 MG PO TABS: ORAL_TABLET | ORAL | 0 refills | Status: DC

## 2018-01-09 MED ORDER — TRIAMCINOLONE ACETONIDE 10 MG/ML IJ SUSP
10.0000 mg | Freq: Once | INTRAMUSCULAR | Status: AC
  Administered 2018-01-09: 10 mg

## 2018-01-09 NOTE — Progress Notes (Signed)
   Subjective:    Patient ID: Bruce Cabrera, male    DOB: [birth year], 52 y.o.   MRN: [id number]  HPI    Review of Systems  All other systems reviewed and are negative.      Objective:   Physical Exam        Assessment & Plan:

## 2018-01-11 NOTE — Progress Notes (Signed)
Subjective:   Patient ID: Bruce Cabrera, male   DOB: 52 y.o.   MRN: [id number]   HPI Patient points to the side of the left lateral foot stating its been inflamed and sore and also has lesions on the hallux of both feet that can become tender.  Patient does have diabetes to be checked at today's running 130 but he does not check himself and is not a very good historian and smokes 1/2 pack/day and likes to be active   Review of Systems  All other systems reviewed and are negative.       Objective:  Physical Exam  Constitutional: He appears well-developed and well-nourished.  Cardiovascular: Intact distal pulses.  Pulmonary/Chest: Effort normal.  Musculoskeletal: Normal range of motion.  Neurological: He is alert.  Skin: Skin is warm.  Nursing note and vitals reviewed.   Neurovascular status intact muscle strength is found to be adequate range of motion was within normal limits.  On the lateral side of left foot base of fifth metatarsal there is inflammation fluid there is also irritation around the big toe joint bilateral and the hallux plantar bilateral.  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  I injected base of fifth MPJ 3 mg Kenalog 5 mg Xylocaine and debrided lesions sub-hallux and placed on 12-day Sterapred DS Dosepak to try to reduce any inflammatory component that might be present with this  X-ray indicates that there is no indication that there is bony injury or ostial lysis or advanced arthritis

## 2018-01-23 ENCOUNTER — Ambulatory Visit (INDEPENDENT_AMBULATORY_CARE_PROVIDER_SITE_OTHER): Payer: BLUE CROSS/BLUE SHIELD | Admitting: Podiatry

## 2018-01-23 ENCOUNTER — Encounter: Payer: Self-pay | Admitting: Podiatry

## 2018-01-23 DIAGNOSIS — B070 Plantar wart: Secondary | ICD-10-CM

## 2018-01-25 NOTE — Progress Notes (Signed)
Subjective:   Patient ID: Bruce Cabrera, male   DOB: 52 y.o.   MRN: [id number]   HPI Patient presents stating still having a lot of pain underneath the foot and states the lesions are really bothering   ROS      Objective:  Physical Exam  Neurovascular status intact with no lesions upon debridement bilateral which do have a appearance to be related to bone structure but also seems to have some lucent core andalsoseemstohavepinpointbleedingwithpossibilitythattheremaybeverruca     Assessment:  Chronic lesion which may be due to bone structure or may be verruca in orientation     Plan:  Sterile debridement of all lesions exposed the areas and apply chemical agent to create immune response along with sterile dressing and reappoint to recheck again in 6 weeks and explained what to do if any blistering should occur

## 2018-03-06 ENCOUNTER — Ambulatory Visit: Payer: BLUE CROSS/BLUE SHIELD | Admitting: Podiatry

## 2018-04-06 ENCOUNTER — Ambulatory Visit (INDEPENDENT_AMBULATORY_CARE_PROVIDER_SITE_OTHER): Payer: BLUE CROSS/BLUE SHIELD | Admitting: Podiatry

## 2018-04-06 ENCOUNTER — Encounter: Payer: Self-pay | Admitting: Podiatry

## 2018-04-06 DIAGNOSIS — B070 Plantar wart: Secondary | ICD-10-CM | POA: Diagnosis not present

## 2018-04-08 NOTE — Progress Notes (Signed)
Subjective:   Patient ID: Bruce Cabrera, male   DOB: 53 y.o.   MRN: [id number]   HPI Patient presents with severe lesions on the plantar and lateral side of the left foot over right foot that are very sore and make it hard to walk   ROS      Objective:  Physical Exam  Neurovascular status intact with severe keratotic lesion fifth metatarsal base left that does have pinpoint bleeding upon debridement and pain to lateral pressure with other lesions that are keratotic     Assessment:  Probability for verruca plantaris left with porokeratotic type lesions also present     Plan:  Deep debridement accomplished with no iatrogenic bleeding and I applied a deep salicylic acid prep to the wart tissue with sterile dressing and instructed on leaving this on for 48 hours and taking it off earlier if necessary.  Reappoint as symptoms indicate

## 2018-08-24 ENCOUNTER — Ambulatory Visit (INDEPENDENT_AMBULATORY_CARE_PROVIDER_SITE_OTHER): Payer: Self-pay | Admitting: Podiatry

## 2018-08-24 ENCOUNTER — Encounter: Payer: Self-pay | Admitting: Podiatry

## 2018-08-24 ENCOUNTER — Other Ambulatory Visit: Payer: Self-pay

## 2018-08-24 VITALS — Temp 98.0°F

## 2018-08-24 DIAGNOSIS — B070 Plantar wart: Secondary | ICD-10-CM

## 2018-08-24 NOTE — Progress Notes (Signed)
Subjective:   Patient ID: Bruce Cabrera, male   DOB: 53 y.o.   MRN: [id number]   HPI Patient states the lesion has reoccurred on the left and it is been there for a long time and it just gets to work.  States the trimming seems to help   ROS      Objective:  Physical Exam  Patient has large keratotic lesion base of fifth metatarsal left that just gets tenderness been present for several years.  It is thickened with keratotic tissue formation     Assessment:  Appears to be benign lesion left lateral foot fifth metatarsal     Plan:  Deep debridement of lesion accomplished I discussed biopsy and patient denies wanting this.  He has lost his insurance cannot afford any kind of biopsy and it is been there a long time so I do not think it is a problem but I did discuss doing this and he refuses to allow me to do it at this time.  Reappoint 4 months or earlier if needed

## 2018-11-16 ENCOUNTER — Ambulatory Visit (INDEPENDENT_AMBULATORY_CARE_PROVIDER_SITE_OTHER): Payer: Self-pay

## 2018-11-16 ENCOUNTER — Ambulatory Visit (HOSPITAL_COMMUNITY)
Admission: EM | Admit: 2018-11-16 | Discharge: 2018-11-16 | Disposition: A | Discharge status: Home / Self Care | Payer: Self-pay | Attending: Family Medicine | Admitting: Family Medicine

## 2018-11-16 ENCOUNTER — Other Ambulatory Visit: Payer: Self-pay

## 2018-11-16 ENCOUNTER — Encounter (HOSPITAL_COMMUNITY): Payer: Self-pay

## 2018-11-16 DIAGNOSIS — M779 Enthesopathy, unspecified: Secondary | ICD-10-CM

## 2018-11-16 DIAGNOSIS — M25511 Pain in right shoulder: Secondary | ICD-10-CM

## 2018-11-16 DIAGNOSIS — B070 Plantar wart: Secondary | ICD-10-CM

## 2018-11-16 MED ORDER — PREDNISONE 10 MG (21) PO TBPK: ORAL_TABLET | Freq: Every day | ORAL | 0 refills | Status: DC

## 2018-11-16 NOTE — Discharge Instructions (Addendum)
Treating you for inflammation in the foot and shoulder pain most likely due to arthritis. Your x ray was normal Take the prednisone as prescribed.  I am giving you contact for dermatology and primary care. Make sure you are checking your blood sugars the prednisone can cause your blood sugars to increase.  Watch your  diet. You can do warm soaks on the foot and try to keep the plantar wart trimmed to help with pain. Follow up as needed for continued or worsening symptoms

## 2018-11-16 NOTE — ED Provider Notes (Signed)
[facility]    CSN: [id number] Arrival date & time: 11/16/18  3875      History   Chief Complaint Chief Complaint  Patient presents with  . Foot Pain    Left  . Shoulder Pain    Right    HPI Bruce Cabrera is a 53 y.o. male.   Patient is a 53 year old male with past medical history of diabetes, hypertension.  He presents today with left foot discomfort.  Patient has known plantar wart to lateral aspect of foot.  Typically sees podiatry for this but has been unable due to insurance.  The plantar wart has become bigger and more painful.  The entire foot is swollen and tender.  Reporting pain with walking.  He has been soaking the foot.  He also has a past medical history of inflammatory tendinitis.  He has had injections in the foot before.  Denies any injuries to the foot, fevers, chills or body aches.  He is also been having right shoulder pain x2 weeks.  Symptoms have been constant and worsening.  Worse with certain range of motion.  No injury to the shoulder.denies any heavy lifting at work, falls and injuries.   ROS per HPI      Past Medical History:  Diagnosis Date  . Diabetes mellitus without complication ([place])   . Hyperlipidemia associated with type 2 diabetes mellitus ([place])    based on 2011 profile  . Hypertension     Patient Active Problem List   Diagnosis Date Noted  . Polycythemia 12/26/2015  . Anginal chest pain at rest [facility]) 11/25/2013  . Diabetes mellitus ([place]) 11/25/2013  . DIABETES MELLITUS, TYPE II 10/13/2006  . HYPERLIPIDEMIA 10/13/2006  . Essential hypertension 10/13/2006  . OBESITY 10/10/2006    Past Surgical History:  Procedure Laterality Date  . LEFT HEART CATHETERIZATION WITH CORONARY ANGIOGRAM N/A 11/29/2013   Procedure: LEFT HEART CATHETERIZATION WITH CORONARY ANGIOGRAM;  Surgeon: Troy Sine, MD;  Location: [facility] CATH LAB;  Service: Cardiovascular;  Laterality: N/A;  . None         Home Medications    Prior to Admission  medications   Medication Sig Start Date End Date Taking? Authorizing Provider  amLODipine (NORVASC) 5 MG tablet Take 1 tablet (5 mg total) by mouth daily. 05/17/17   Gareth Morgan, MD  aspirin EC 81 MG tablet Take 1 tablet (81 mg total) by mouth daily. 11/25/13   Gustavus Bryant, MD  Blood Glucose Monitoring Suppl (TRUE METRIX METER) w/Device KIT Test fasting blood sugars and at bedtime 11/17/15   Argentina Donovan, PA-C  cyclobenzaprine (FLEXERIL) 10 MG tablet Take 1 tablet (10 mg total) by mouth 2 (two) times daily as needed for muscle spasms. 05/17/17   Gareth Morgan, MD  glucose blood (TRUE METRIX BLOOD GLUCOSE TEST) test strip Use as instructed 11/17/15   Argentina Donovan, PA-C  losartan (COZAAR) 100 MG tablet Take 0.5 tablets (50 mg total) by mouth daily. 11/17/15   Argentina Donovan, PA-C  metFORMIN (GLUCOPHAGE) 1000 MG tablet Take 1 tablet (1,000 mg total) by mouth 2 (two) times daily with a meal. 11/17/15   McClung, Dionne Bucy, PA-C  metFORMIN (GLUCOPHAGE) 500 MG tablet Take 1 tablet (500 mg total) by mouth 2 (two) times daily. 05/17/17   Gareth Morgan, MD  metoprolol tartrate (LOPRESSOR) 25 MG tablet Take 1 tablet (25 mg total) by mouth 2 (two) times daily. 05/17/17   Gareth Morgan, MD  nitroGLYCERIN (NITROSTAT) 0.4 MG SL  tablet Place 1 tablet (0.4 mg total) under the tongue every 5 (five) minutes as needed for chest pain. 11/25/13   Gustavus Bryant, MD  predniSONE (STERAPRED UNI-PAK 21 TAB) 10 MG (21) TBPK tablet Take by mouth daily. Take 6 tabs by mouth daily  for 2 days, then 5 tabs for 2 days, then 4 tabs for 2 days, then 3 tabs for 2 days, 2 tabs for 2 days, then 1 tab by mouth daily for 2 days 11/16/18   Orvan July, NP  TRUEPLUS LANCETS 28G MISC Use as directed 11/17/15   Argentina Donovan, PA-C    Family History Family History  Problem Relation Age of Onset  . Heart attack Mother 66  . Heart failure Brother 69    Social History Social History   Tobacco Use  . Smoking  status: Current Every Day Smoker    Packs/day: 0.50    Years: 30.00    Pack years: 15.00    Types: Cigarettes  . Smokeless tobacco: Never Used  Substance Use Topics  . Alcohol use: Yes    Comment: 2- tallboys a day  . Drug use: No     Allergies   Patient has no known allergies.   Review of Systems Review of Systems   Physical Exam Triage Vital Signs ED Triage Vitals  Enc Vitals Group     BP 11/16/18 0834 (!) 166/101     Pulse Rate 11/16/18 0834 88     Resp 11/16/18 0834 16     Temp 11/16/18 0834 97.9 F (36.6 C)     Temp Source 11/16/18 0834 Temporal     SpO2 11/16/18 0834 97 %     Weight --      Height --      Head Circumference --      Peak Flow --      Pain Score 11/16/18 0841 9     Pain Loc --      Pain Edu? --      Excl. in [place]? --    No data found.  Updated Vital Signs BP (!) 166/101 (BP Location: Left Arm)   Pulse 88   Temp 97.9 F (36.6 C) (Temporal)   Resp 16   SpO2 97%   Visual Acuity Right Eye Distance:   Left Eye Distance:   Bilateral Distance:    Right Eye Near:   Left Eye Near:    Bilateral Near:     Physical Exam Vitals signs and nursing note reviewed.  Constitutional:      General: He is not in acute distress.    Appearance: Normal appearance. He is not ill-appearing, toxic-appearing or diaphoretic.  HENT:     Head: Normocephalic and atraumatic.     Nose: Nose normal.  Eyes:     Conjunctiva/sclera: Conjunctivae normal.  Neck:     Musculoskeletal: Normal range of motion.  Pulmonary:     Effort: Pulmonary effort is normal.  Musculoskeletal:        General: Tenderness present.     Right shoulder: He exhibits decreased range of motion, tenderness and crepitus. He exhibits no bony tenderness, no swelling, no effusion, no deformity, no laceration, no pain, no spasm, normal pulse and normal strength.       Feet:     Comments: Large planter wart with generalized redness and swelling of the left foot. Tender to touch near the 5th  metatarsal. Pedal pulse 1+. Limited ROM.   See picture for more detail.  Skin:    General: Skin is warm and dry.  Neurological:     Mental Status: He is alert.  Psychiatric:        Mood and Affect: Mood normal.          UC Treatments / Results  Labs (all labs ordered are listed, but only abnormal results are displayed) Labs Reviewed - No data to display  EKG   Radiology Dg Shoulder Right  Result Date: 11/16/2018 CLINICAL DATA:  Right shoulder pain for 2 weeks, limited range of motion EXAM: RIGHT SHOULDER - 2+ VIEW COMPARISON:  None. FINDINGS: There is no evidence of fracture or dislocation. There is no evidence of arthropathy or other focal bone abnormality. Soft tissues are unremarkable. IMPRESSION: Negative. Electronically Signed   By: Davina Poke M.D.   On: 11/16/2018 10:19    Procedures Procedures (including critical care time)  Medications Ordered in UC Medications - No data to display  Initial Impression / Assessment and Plan / UC Course  I have reviewed the triage vital signs and the nursing notes.  Pertinent labs & imaging results that were available during my care of the patient were reviewed by me and considered in my medical decision making (see chart for details).     Right shoulder pain- x ray negative. Most likely related to arthritis.  Treating with prednisone and will have him follow up with primary care. RICE.   Plantar wart with tendonitis and inflammation of the left foot. Attempted to shave some of the plantar wart off here in clinic. Recommend soaking and shaving down the plantar wart at home.  Amatory referral given to dermatology and contact given for primary care. I believe the swelling is related to inflammation and not infection. Prednisone will also help with this.     Final Clinical Impressions(s) / UC Diagnoses   Final diagnoses:  Tendonitis  Plantar wart of left foot  Acute pain of right shoulder     Discharge  Instructions     Treating you for inflammation in the foot and shoulder pain most likely due to arthritis. Your x ray was normal Take the prednisone as prescribed.  I am giving you contact for dermatology and primary care. Make sure you are checking your blood sugars the prednisone can cause your blood sugars to increase.  Watch your  diet. You can do warm soaks on the foot and try to keep the plantar wart trimmed to help with pain. Follow up as needed for continued or worsening symptoms    ED Prescriptions    Medication Sig Dispense Auth. Provider   predniSONE (STERAPRED UNI-PAK 21 TAB) 10 MG (21) TBPK tablet Take by mouth daily. Take 6 tabs by mouth daily  for 2 days, then 5 tabs for 2 days, then 4 tabs for 2 days, then 3 tabs for 2 days, 2 tabs for 2 days, then 1 tab by mouth daily for 2 days 42 tablet Bejamin Hackbart A, NP     PDMP not reviewed this encounter.   Orvan July, NP 11/16/18 1536

## 2018-11-16 NOTE — ED Triage Notes (Signed)
Pt presents with right shoulder pain X 2 weeks.  Pt also presents with left foot pain that believes to associated with possible bone spur; pt states he used to see a podiatrist but lost his insurance, he states the pain & swelling has got worse of past few months.

## 2019-01-18 DIAGNOSIS — L97521 Non-pressure chronic ulcer of other part of left foot limited to breakdown of skin: Secondary | ICD-10-CM | POA: Diagnosis not present

## 2019-01-18 DIAGNOSIS — M25775 Osteophyte, left foot: Secondary | ICD-10-CM | POA: Diagnosis not present

## 2019-01-27 DIAGNOSIS — L97521 Non-pressure chronic ulcer of other part of left foot limited to breakdown of skin: Secondary | ICD-10-CM | POA: Diagnosis not present

## 2019-02-04 DIAGNOSIS — L97521 Non-pressure chronic ulcer of other part of left foot limited to breakdown of skin: Secondary | ICD-10-CM | POA: Diagnosis not present

## 2019-02-08 ENCOUNTER — Other Ambulatory Visit: Payer: Self-pay

## 2019-02-08 ENCOUNTER — Other Ambulatory Visit: Payer: Self-pay | Admitting: Podiatry

## 2019-02-08 ENCOUNTER — Ambulatory Visit: Payer: BC Managed Care – PPO | Admitting: Podiatry

## 2019-02-08 ENCOUNTER — Ambulatory Visit (INDEPENDENT_AMBULATORY_CARE_PROVIDER_SITE_OTHER): Payer: BC Managed Care – PPO

## 2019-02-08 DIAGNOSIS — S92315D Nondisplaced fracture of first metatarsal bone, left foot, subsequent encounter for fracture with routine healing: Secondary | ICD-10-CM | POA: Diagnosis not present

## 2019-02-08 DIAGNOSIS — M7752 Other enthesopathy of left foot: Secondary | ICD-10-CM | POA: Diagnosis not present

## 2019-02-08 DIAGNOSIS — L989 Disorder of the skin and subcutaneous tissue, unspecified: Secondary | ICD-10-CM | POA: Diagnosis not present

## 2019-02-08 NOTE — Patient Instructions (Signed)
Pre-Operative Instructions  Congratulations, you have decided to take an important step towards improving your quality of life.  You can be assured that the doctors and staff at [facility] will be with you every step of the way.  Here are some important things you should know:  1. Plan to be at the surgery center/hospital at least 1 (one) hour prior to your scheduled time, unless otherwise directed by the surgical center/hospital staff.  You must have a responsible adult accompany you, remain during the surgery and drive you home.  Make sure you have directions to the surgical center/hospital to ensure you arrive on time. 2. If you are having surgery at [facility] or [facility] hospitals, you will need a copy of your medical history and physical form from your family physician within one month prior to the date of surgery. We will give you a form for your primary physician to complete.  3. We make every effort to accommodate the date you request for surgery.  However, there are times where surgery dates or times have to be moved.  We will contact you as soon as possible if a change in schedule is required.   4. No aspirin/ibuprofen for one week before surgery.  If you are on aspirin, any non-steroidal anti-inflammatory medications (Mobic, Aleve, Ibuprofen) should not be taken seven (7) days prior to your surgery.  You make take Tylenol for pain prior to surgery.  5. Medications - If you are taking daily heart and blood pressure medications, seizure, reflux, allergy, asthma, anxiety, pain or diabetes medications, make sure you notify the surgery center/hospital before the day of surgery so they can tell you which medications you should take or avoid the day of surgery. 6. No food or drink after midnight the night before surgery unless directed otherwise by surgical center/hospital staff. 7. No alcoholic beverages 24-hours prior to surgery.  No smoking 24-hours prior or 24-hours after  surgery. 8. Wear loose pants or shorts. They should be loose enough to fit over bandages, boots, and casts. 9. Don't wear slip-on shoes. Sneakers are preferred. 10. Bring your boot with you to the surgery center/hospital.  Also bring crutches or a walker if your physician has prescribed it for you.  If you do not have this equipment, it will be provided for you after surgery. 11. If you have not been contacted by the surgery center/hospital by the day before your surgery, call to confirm the date and time of your surgery. 12. Leave-time from work may vary depending on the type of surgery you have.  Appropriate arrangements should be made prior to surgery with your employer. 13. Prescriptions will be provided immediately following surgery by your doctor.  Fill these as soon as possible after surgery and take the medication as directed. Pain medications will not be refilled on weekends and must be approved by the doctor. 14. Remove nail polish on the operative foot and avoid getting pedicures prior to surgery. 15. Wash the night before surgery.  The night before surgery wash the foot and leg well with water and the antibacterial soap provided. Be sure to pay special attention to beneath the toenails and in between the toes.  Wash for at least three (3) minutes. Rinse thoroughly with water and dry well with a towel.  Perform this wash unless told not to do so by your physician.  Enclosed: 1 Ice pack (please put in freezer the night before surgery)   1 Hibiclens skin cleaner     Pre-op instructions  If you have any questions regarding the instructions, please do not hesitate to call our office.  [place]: [address], [place], [place] [address] -- [phone]  [place]: [address] [place], [place] [address] -- [phone]  [place]: [address], [place], [place] [address] -- [phone]   Website: [email or web address] 

## 2019-02-08 NOTE — Progress Notes (Signed)
   HPI: 53 y.o. male presenting today for evaluation of left plantar midfoot pain.  Patient's been dealing with this for the past 1+ years and has seen multiple physicians.  He says that recently developed some swelling to the medial aspect of the left midfoot.  There is pain with pressure and he is unable to walk for long distances.  He was referred here by Dr. Gershon Mussel for follow-up evaluation and treatment  Past Medical History:  Diagnosis Date  . Diabetes mellitus without complication ([place])   . Hyperlipidemia associated with type 2 diabetes mellitus ([place])    based on 2011 profile  . Hypertension      Physical Exam: General: The patient is alert and oriented x3 in no acute distress.  Dermatology: Skin is warm, dry and supple bilateral lower extremities. Negative for open lesions or macerations.  Vascular: Palpable pedal pulses bilaterally. No edema or erythema noted. Capillary refill within normal limits.  Neurological: Epicritic and protective threshold grossly intact bilaterally.   Musculoskeletal Exam: Range of motion within normal limits to all pedal and ankle joints bilateral. Muscle strength 5/5 in all groups bilateral.  Pain on palpation at the base of the first metatarsal and also base of the fifth metatarsal tubercle left foot  Radiographic Exam:  Normal osseous mineralization. Joint spaces preserved. No fracture/dislocation/boney destruction.  There does appear to be a chronic fracture to the base of the first metatarsal with minimal displacement.  There is some callus formation noted around the fracture site indicative of routine healing.  There is also soft tissue edema noted around the fifth metatarsal tubercle  Assessment: 1.  Old fracture base of the first metatarsal with routine healing left 2.  Prominent fifth metatarsal tubercle with soft tissue swelling left   Plan of Care:  1. Patient evaluated. X-Rays reviewed.  2.  Cam boot dispensed.  Weightbearing as  tolerated 3.  Today we discussed conservative versus surgical management regarding the patient's fifth metatarsal tubercle with soft tissue swelling.  That is the main complaint for the patient presenting today.  He would like to have surgical management of this area.  All possible complications and details the procedure were explained.  No guarantees were expressed or implied. 4.  Authorization for surgery initiated today.  Surgery will consist of fifth metatarsal tubercle exostectomy left.  Excision of benign skin lesion left.  Surgery will not be scheduled until mid February and by that time I do suspect that the fracture to the base of the first metatarsal will be most likely healed. 5.  Return to clinic 1 week postop  *Works for [employer]      Edrick Kins, DPM [facility]  Dr. Edrick Kins, DPM    [address], [place] [address]                Office [phone]  Fax [phone]

## 2019-04-01 ENCOUNTER — Telehealth: Payer: Self-pay

## 2019-04-01 NOTE — Telephone Encounter (Signed)
DOS 04/08/2019 OSTECT. COMP. MET HEAD LEFT - 28112 EXC. BENIGN LESION LEFT - 11422  [employer] ELIGIBLE - 04/19/2018 TO PRESENT  PLAN DED - $1500.00 - $1170.28 REMAINING  OUT OF POCKET - $3000 - $2505.81 REMAINING   COPAY - $0  CO-INSURANCE - 20% OUTPATIENT SURGERY  NO PRE-CERT REQUIRED PER [employer] SITE

## 2019-04-08 ENCOUNTER — Encounter: Payer: Self-pay | Admitting: Podiatry

## 2019-04-14 ENCOUNTER — Encounter: Payer: BC Managed Care – PPO | Admitting: Podiatry

## 2019-04-16 ENCOUNTER — Other Ambulatory Visit: Payer: Self-pay | Admitting: Podiatry

## 2019-04-16 DIAGNOSIS — B078 Other viral warts: Secondary | ICD-10-CM | POA: Diagnosis not present

## 2019-04-16 DIAGNOSIS — I10 Essential (primary) hypertension: Secondary | ICD-10-CM | POA: Diagnosis not present

## 2019-04-16 DIAGNOSIS — M21542 Acquired clubfoot, left foot: Secondary | ICD-10-CM | POA: Diagnosis not present

## 2019-04-16 DIAGNOSIS — M898X7 Other specified disorders of bone, ankle and foot: Secondary | ICD-10-CM | POA: Diagnosis not present

## 2019-04-16 DIAGNOSIS — M7752 Other enthesopathy of left foot: Secondary | ICD-10-CM | POA: Diagnosis not present

## 2019-04-16 DIAGNOSIS — D2372 Other benign neoplasm of skin of left lower limb, including hip: Secondary | ICD-10-CM | POA: Diagnosis not present

## 2019-04-16 MED ORDER — OXYCODONE-ACETAMINOPHEN 5-325 MG PO TABS: 1.0000 | ORAL_TABLET | Freq: Four times a day (QID) | ORAL | 0 refills | Status: DC | PRN

## 2019-04-16 NOTE — Progress Notes (Signed)
PRN postop 

## 2019-04-21 ENCOUNTER — Encounter: Payer: BC Managed Care – PPO | Admitting: Podiatry

## 2019-04-26 ENCOUNTER — Ambulatory Visit (INDEPENDENT_AMBULATORY_CARE_PROVIDER_SITE_OTHER): Payer: BC Managed Care – PPO | Admitting: Podiatry

## 2019-04-26 ENCOUNTER — Ambulatory Visit (INDEPENDENT_AMBULATORY_CARE_PROVIDER_SITE_OTHER): Payer: BC Managed Care – PPO

## 2019-04-26 ENCOUNTER — Other Ambulatory Visit: Payer: Self-pay

## 2019-04-26 ENCOUNTER — Telehealth: Payer: Self-pay | Admitting: Podiatry

## 2019-04-26 DIAGNOSIS — Z9889 Other specified postprocedural states: Secondary | ICD-10-CM

## 2019-04-26 DIAGNOSIS — M7752 Other enthesopathy of left foot: Secondary | ICD-10-CM

## 2019-04-26 MED ORDER — OXYCODONE-ACETAMINOPHEN 5-325 MG PO TABS: 1.0000 | ORAL_TABLET | Freq: Four times a day (QID) | ORAL | 0 refills | Status: DC | PRN

## 2019-04-26 NOTE — Telephone Encounter (Signed)
I called Almyra Free and confirmed that pt did have sx on 04/16/19 and that he was here today for his first postop visit. Told her if she had any questions regarding FMLA/STD to contact [place] via phone at [phone] or via fax at [phone].

## 2019-04-26 NOTE — Telephone Encounter (Signed)
I'm calling to confirm pt had sx on 04/16/19 as planned. Please call me back at [phone] and leave the information on my voicemail if I'm not available. Thank you.

## 2019-04-27 ENCOUNTER — Other Ambulatory Visit: Payer: Self-pay | Admitting: Podiatry

## 2019-04-27 ENCOUNTER — Telehealth: Payer: Self-pay | Admitting: *Deleted

## 2019-04-27 MED ORDER — OXYCODONE-ACETAMINOPHEN 5-325 MG PO TABS: 1.0000 | ORAL_TABLET | Freq: Four times a day (QID) | ORAL | 0 refills | Status: DC | PRN

## 2019-04-27 NOTE — Telephone Encounter (Signed)
Done

## 2019-04-27 NOTE — Telephone Encounter (Signed)
Pt states the [facility] at [facility] [address] and they still don't have it, would like it transferred to [facility] on [address].

## 2019-04-27 NOTE — Progress Notes (Signed)
Rx sent to pharmacy   

## 2019-04-29 NOTE — Progress Notes (Signed)
   HPI: 54 y.o. male presenting today status post 5th metatarsal tubercle exostectomy left. DOS: 04/16/2019. He reports a dull aching pain. He reports associated minor swelling of the foot. He has been taking Percocet as directed and requests a refill. He has been elevating the foot and using the CAM boot as directed. There are no worsening factors noted. Patient is here for further evaluation and treatment.   Past Medical History:  Diagnosis Date  . Diabetes mellitus without complication ([place])   . Hyperlipidemia associated with type 2 diabetes mellitus ([place])    based on 2011 profile  . Hypertension      Objective/Physical Exam Neurovascular status intact.  Skin incisions appear to be well coapted with sutures and staples intact. No sign of infectious process noted. No dehiscence. No active bleeding noted. Moderate edema noted to the surgical extremity. Pain on palpation at the base of the first metatarsal and also base of the fifth metatarsal tubercle left foot  Radiographic Exam:  Orthopedic hardware and osteotomies sites appear to be stable with routine healing. Old fracture noted to the base of the first metatarsal of the left foot with routine healing.   Assessment: 1. Old fracture base of the first metatarsal with routine healing left 2. S/p 5th metatarsal tubercle exostectomy left DOS: 04/16/2019   Plan of Care:  1. Patient evaluated. X-Rays reviewed.  2. Dressing changed. Keep clean, dry and intact for one week.  3. Post op shoe dispensed. Discontinue using CAM boot.  4. Refill prescription for Percocet 5/325 mg provided to patient.  5. Return to clinic in one week.   *Works for [employer]      Edrick Kins, DPM [facility]  Dr. Edrick Kins, DPM    [address], [place] [address]                Office [phone]  Fax [phone]

## 2019-05-03 ENCOUNTER — Ambulatory Visit (INDEPENDENT_AMBULATORY_CARE_PROVIDER_SITE_OTHER): Payer: BC Managed Care – PPO | Admitting: Podiatry

## 2019-05-03 ENCOUNTER — Other Ambulatory Visit: Payer: Self-pay

## 2019-05-03 DIAGNOSIS — Z9889 Other specified postprocedural states: Secondary | ICD-10-CM

## 2019-05-03 DIAGNOSIS — M7752 Other enthesopathy of left foot: Secondary | ICD-10-CM

## 2019-05-05 ENCOUNTER — Encounter: Payer: BC Managed Care – PPO | Admitting: Podiatry

## 2019-05-05 NOTE — Progress Notes (Signed)
   HPI: 54 y.o. male presenting today status post 5th metatarsal tubercle exostectomy left. DOS: 04/16/2019. He states he is doing well and improving. He reports some intermittent soreness. He reports a decrease in swelling. He has been using the post op shoe as directed. Patient is here for further evaluation and treatment.   Past Medical History:  Diagnosis Date  . Diabetes mellitus without complication ([place])   . Hyperlipidemia associated with type 2 diabetes mellitus ([place])    based on 2011 profile  . Hypertension      Objective/Physical Exam Neurovascular status intact.  Skin incisions appear to be well coapted with sutures and staples intact. No sign of infectious process noted. No dehiscence. No active bleeding noted. Moderate edema noted to the surgical extremity. Pain on palpation at the base of the first metatarsal and also base of the fifth metatarsal tubercle left foot   Assessment: 1. Old fracture base of the first metatarsal with routine healing left 2. S/p 5th metatarsal tubercle exostectomy left DOS: 04/16/2019   Plan of Care:  1. Patient evaluated.  2. Sutures and staples removed.  3. Transition out of post op shoe.  4. Recommended wide fitting shoes.  5. Return to clinic in 4 weeks.    *Works for [employer]      Edrick Kins, DPM [facility]  Dr. Edrick Kins, DPM    [address], [place] [address]                Office [phone]  Fax [phone]

## 2019-05-07 ENCOUNTER — Encounter: Payer: Self-pay | Admitting: Podiatry

## 2019-05-17 ENCOUNTER — Encounter: Payer: BC Managed Care – PPO | Admitting: Podiatry

## 2019-05-24 ENCOUNTER — Ambulatory Visit (INDEPENDENT_AMBULATORY_CARE_PROVIDER_SITE_OTHER): Payer: BC Managed Care – PPO

## 2019-05-24 ENCOUNTER — Other Ambulatory Visit: Payer: Self-pay

## 2019-05-24 ENCOUNTER — Ambulatory Visit (INDEPENDENT_AMBULATORY_CARE_PROVIDER_SITE_OTHER): Payer: BC Managed Care – PPO | Admitting: Podiatry

## 2019-05-24 VITALS — Temp 97.5°F

## 2019-05-24 DIAGNOSIS — S92315D Nondisplaced fracture of first metatarsal bone, left foot, subsequent encounter for fracture with routine healing: Secondary | ICD-10-CM

## 2019-05-24 DIAGNOSIS — Z9889 Other specified postprocedural states: Secondary | ICD-10-CM

## 2019-05-25 ENCOUNTER — Encounter: Payer: Self-pay | Admitting: Podiatry

## 2019-05-27 NOTE — Progress Notes (Signed)
   HPI: 54 y.o. male presenting today status post 5th metatarsal tubercle exostectomy left. DOS: 04/16/2019. He states he is doing well and improving. He reports some swelling that occurred today. He denies any significant pain or modifying factors. He has been wearing good shoe gear as directed. Patient is here for further evaluation and treatment.   Past Medical History:  Diagnosis Date  . Diabetes mellitus without complication ([place])   . Hyperlipidemia associated with type 2 diabetes mellitus ([place])    based on 2011 profile  . Hypertension      Objective/Physical Exam Neurovascular status intact.  Skin incisions appear to be well coapted. No sign of infectious process noted. No dehiscence. No active bleeding noted. Moderate edema noted to the surgical extremity. Pain on palpation at the base of the first metatarsal and also base of the fifth metatarsal tubercle left foot.  Radiographic Exam:  Osteotomies sites appear to be stable with routine healing.  Assessment: 1. Old fracture base of the first metatarsal with routine healing left 2. S/p 5th metatarsal tubercle exostectomy left DOS: 04/16/2019   Plan of Care:  1. Patient evaluated. X-Rays reviewed.  2. Compression anklet dispensed.  3. Recommended good shoe gear.  4. Return to clinic in 4 weeks for final follow up and return to work.     *Works for [employer]      Edrick Kins, DPM [facility]  Dr. Edrick Kins, DPM    [address], [place] [address]                Office [phone]  Fax [phone]

## 2019-06-21 ENCOUNTER — Ambulatory Visit (INDEPENDENT_AMBULATORY_CARE_PROVIDER_SITE_OTHER): Payer: BC Managed Care – PPO | Admitting: Podiatry

## 2019-06-21 ENCOUNTER — Other Ambulatory Visit: Payer: Self-pay

## 2019-06-21 ENCOUNTER — Encounter: Payer: Self-pay | Admitting: Podiatry

## 2019-06-21 DIAGNOSIS — S92315D Nondisplaced fracture of first metatarsal bone, left foot, subsequent encounter for fracture with routine healing: Secondary | ICD-10-CM

## 2019-06-21 DIAGNOSIS — Z9889 Other specified postprocedural states: Secondary | ICD-10-CM

## 2019-06-23 NOTE — Progress Notes (Signed)
   HPI: 54 y.o. male presenting today status post 5th metatarsal tubercle exostectomy left. DOS: 04/16/2019. He reports continued swelling. He has been using the compression anklet as directed. There are no worsening factors noted. Patient is here for further evaluation and treatment.   Past Medical History:  Diagnosis Date  . Diabetes mellitus without complication ([place])   . Hyperlipidemia associated with type 2 diabetes mellitus ([place])    based on 2011 profile  . Hypertension      Objective/Physical Exam Neurovascular status intact.  Skin incisions appear to be well coapted. No sign of infectious process noted. No dehiscence. No active bleeding noted. Moderate edema noted to the surgical extremity. Pain on palpation at the base of the first metatarsal and also base of the fifth metatarsal tubercle left foot.  Assessment: 1. Old fracture base of the first metatarsal with routine healing left 2. S/p 5th metatarsal tubercle exostectomy left DOS: 04/16/2019   Plan of Care:  1. Patient evaluated.  2. May resume full activity with no restrictions.  3. Recommended good shoe gear.  4. Return to work on 06/28/2019 with full activity and no restrictions.  5. Return to clinic monthly.      *Works for [employer]     Edrick Kins, DPM [facility]  Dr. Edrick Kins, DPM    [address], [place] [address]                Office [phone]  Fax [phone]

## 2019-07-21 ENCOUNTER — Encounter: Payer: BC Managed Care – PPO | Admitting: Podiatry

## 2019-10-21 ENCOUNTER — Encounter: Payer: Self-pay | Admitting: Podiatry

## 2019-10-21 ENCOUNTER — Ambulatory Visit (INDEPENDENT_AMBULATORY_CARE_PROVIDER_SITE_OTHER): Payer: BC Managed Care – PPO | Admitting: Podiatry

## 2019-10-21 ENCOUNTER — Other Ambulatory Visit: Payer: Self-pay | Admitting: Podiatry

## 2019-10-21 ENCOUNTER — Other Ambulatory Visit: Payer: Self-pay

## 2019-10-21 ENCOUNTER — Ambulatory Visit (INDEPENDENT_AMBULATORY_CARE_PROVIDER_SITE_OTHER): Payer: BC Managed Care – PPO

## 2019-10-21 VITALS — Temp 97.3°F

## 2019-10-21 DIAGNOSIS — M7672 Peroneal tendinitis, left leg: Secondary | ICD-10-CM

## 2019-10-21 DIAGNOSIS — S92315D Nondisplaced fracture of first metatarsal bone, left foot, subsequent encounter for fracture with routine healing: Secondary | ICD-10-CM | POA: Diagnosis not present

## 2019-10-21 DIAGNOSIS — M79672 Pain in left foot: Secondary | ICD-10-CM

## 2019-10-26 NOTE — Progress Notes (Signed)
Subjective:   Patient ID: Bruce Cabrera, male   DOB: 54 y.o.   MRN: [id number]   HPI Patient presents stating he has a spot on his left foot that is been very tender and he did have a previous fracture which had occurred.  States that it does not seem to be quite in the same spot but is further distal and plantar but he is desperate to be able to work especially over the next few days as he is training new group and he has no choice   ROS      Objective:  Physical Exam  Neurovascular status was found to be intact muscle strength was found to be adequate and I did pay attention to the peroneal tendon left which does appear to have some fusiform swelling but does not appear to have dysfunction or indications of tear.  The pain is more plantar and distal and patient did also have a history of a closed fracture of the fifth metatarsal which did heal     Assessment:  Probability for an inflammatory peroneal tendon issue but I cannot rule out that this is not related to previous fracture fifth metatarsal base     Plan:  H&P reviewed condition with patient.  Reviewed x-rays discussed treatment options and at this point were to do a very careful steroid injection plantar to the insertion fifth metatarsal and slightly distal.  I did do this with 3 mg Dexasone 5 mg Xylocaine and then spent a great deal time going over fracture and there is always a slight possibility that this is related to bone even though no indication.  Patient will wear his fracture boot that he has at home ice as aggressively as possible and will be checked back again in 2 weeks or earlier if needed  X-rays indicate that there is a healing around this area I did not note any fresh fracture or any other pathology

## 2019-11-08 ENCOUNTER — Ambulatory Visit: Payer: BC Managed Care – PPO | Admitting: Podiatry

## 2020-05-16 ENCOUNTER — Emergency Department (HOSPITAL_COMMUNITY): Payer: BC Managed Care – PPO

## 2020-05-16 ENCOUNTER — Emergency Department (HOSPITAL_COMMUNITY)
Admission: EM | Admit: 2020-05-16 | Discharge: 2020-05-16 | Disposition: A | Discharge status: Home / Self Care | Payer: BC Managed Care – PPO | Attending: Emergency Medicine | Admitting: Emergency Medicine

## 2020-05-16 ENCOUNTER — Encounter (HOSPITAL_COMMUNITY): Payer: Self-pay

## 2020-05-16 ENCOUNTER — Other Ambulatory Visit: Payer: Self-pay

## 2020-05-16 DIAGNOSIS — E119 Type 2 diabetes mellitus without complications: Secondary | ICD-10-CM | POA: Insufficient documentation

## 2020-05-16 DIAGNOSIS — I10 Essential (primary) hypertension: Secondary | ICD-10-CM | POA: Insufficient documentation

## 2020-05-16 DIAGNOSIS — Z7984 Long term (current) use of oral hypoglycemic drugs: Secondary | ICD-10-CM | POA: Insufficient documentation

## 2020-05-16 DIAGNOSIS — K852 Alcohol induced acute pancreatitis without necrosis or infection: Secondary | ICD-10-CM

## 2020-05-16 DIAGNOSIS — Z7982 Long term (current) use of aspirin: Secondary | ICD-10-CM | POA: Diagnosis not present

## 2020-05-16 DIAGNOSIS — F17210 Nicotine dependence, cigarettes, uncomplicated: Secondary | ICD-10-CM | POA: Diagnosis not present

## 2020-05-16 DIAGNOSIS — R079 Chest pain, unspecified: Secondary | ICD-10-CM | POA: Diagnosis not present

## 2020-05-16 DIAGNOSIS — Z79899 Other long term (current) drug therapy: Secondary | ICD-10-CM | POA: Insufficient documentation

## 2020-05-16 DIAGNOSIS — R1013 Epigastric pain: Secondary | ICD-10-CM | POA: Diagnosis not present

## 2020-05-16 LAB — CBC
HCT: 53.1 % — ABNORMAL HIGH (ref 39.0–52.0)
Hemoglobin: 17.7 g/dL — ABNORMAL HIGH (ref 13.0–17.0)
MCH: 25.7 pg — ABNORMAL LOW (ref 26.0–34.0)
MCHC: 33.3 g/dL (ref 30.0–36.0)
MCV: 77.2 fL — ABNORMAL LOW (ref 80.0–100.0)
Platelets: 196 10*3/uL (ref 150–400)
RBC: 6.88 MIL/uL — ABNORMAL HIGH (ref 4.22–5.81)
RDW: 15.1 % (ref 11.5–15.5)
WBC: 5.8 10*3/uL (ref 4.0–10.5)
nRBC: 0 % (ref 0.0–0.2)

## 2020-05-16 LAB — COMPREHENSIVE METABOLIC PANEL
ALT: 16 U/L (ref 0–44)
AST: 19 U/L (ref 15–41)
Albumin: 3.5 g/dL (ref 3.5–5.0)
Alkaline Phosphatase: 42 U/L (ref 38–126)
Anion gap: 9 (ref 5–15)
BUN: 13 mg/dL (ref 6–20)
CO2: 26 mmol/L (ref 22–32)
Calcium: 10.2 mg/dL (ref 8.9–10.3)
Chloride: 105 mmol/L (ref 98–111)
Creatinine, Ser: 1 mg/dL (ref 0.61–1.24)
GFR, Estimated: >60 mL/min (ref >60)
Glucose, Bld: 191 mg/dL — ABNORMAL HIGH (ref 70–99)
Potassium: 3.7 mmol/L (ref 3.5–5.1)
Sodium: 140 mmol/L (ref 135–145)
Total Bilirubin: 0.8 mg/dL (ref 0.3–1.2)
Total Protein: 7.3 g/dL (ref 6.5–8.1)

## 2020-05-16 LAB — LIPASE, BLOOD: Lipase: 366 U/L — ABNORMAL HIGH (ref 11–51)

## 2020-05-16 LAB — TROPONIN I (HIGH SENSITIVITY)
Troponin I (High Sensitivity): 7 ng/L (ref <18)
Troponin I (High Sensitivity): 6 ng/L (ref <18)

## 2020-05-16 MED ORDER — METFORMIN HCL 500 MG PO TABS: 500.0000 mg | ORAL_TABLET | Freq: Two times a day (BID) | ORAL | 2 refills | Status: DC

## 2020-05-16 MED ORDER — OXYCODONE-ACETAMINOPHEN 5-325 MG PO TABS: 1.0000 | ORAL_TABLET | Freq: Four times a day (QID) | ORAL | 0 refills | Status: DC | PRN

## 2020-05-16 MED ORDER — LACTATED RINGERS IV BOLUS
1000.0000 mL | Freq: Once | INTRAVENOUS | Status: AC
  Administered 2020-05-16: 1000 mL via INTRAVENOUS

## 2020-05-16 MED ORDER — OMEPRAZOLE 20 MG PO CPDR: 20.0000 mg | DELAYED_RELEASE_CAPSULE | Freq: Every day | ORAL | 0 refills | Status: DC

## 2020-05-16 MED ORDER — AMLODIPINE BESYLATE 5 MG PO TABS: 5.0000 mg | ORAL_TABLET | Freq: Every day | ORAL | 2 refills | Status: DC

## 2020-05-16 MED ORDER — LIDOCAINE VISCOUS HCL 2 % MT SOLN
15.0000 mL | Freq: Once | OROMUCOSAL | Status: AC
  Administered 2020-05-16: 15 mL via ORAL
  Filled 2020-05-16: qty 15

## 2020-05-16 MED ORDER — ALUM & MAG HYDROXIDE-SIMETH 200-200-20 MG/5ML PO SUSP
30.0000 mL | Freq: Once | ORAL | Status: AC
  Administered 2020-05-16: 30 mL via ORAL
  Filled 2020-05-16: qty 30

## 2020-05-16 NOTE — Discharge Instructions (Addendum)
Today we found that you had pancreatitis.  This is most likely from your alcohol use.  It will be important for you to decrease and try to completely stop using alcohol to allow your pancreas to get better.  Make sure you are drinking plenty of fluids and start with a clear diet of broth, Jell-O, applesauce until the pain resolves and then slowly return to your normal diet.  We also gave you a prescription for blood pressure medicine and diabetes medicine.  It will be very important for you to follow-up with your regular doctor.  No sign of heart problems today.  If you start having worsening pain and start vomiting and cannot hold anything down please return to the emergency room.

## 2020-05-16 NOTE — Progress Notes (Addendum)
2:28 pm TOC CM contacted pt and provided info on appt arranged on 06/28/2020 at 210 pm at [facility]. Explained to pt to keep appt or reschedule if he is unable to make it. Provided contact number and address. Ed provider updated. Jonnie Finner RN CCM, [facility] ED TOC CM [phone]   12:07 pm TOC CM contacted pt and offered to arrange a follow up appt with [facility] for appt. Pt will continue to follow up with his plan for a provider list. Jonnie Finner RN [place], [facility] ED TOC CM [phone]

## 2020-05-16 NOTE — ED Triage Notes (Signed)
Patient c/o intermittent mid upper abdominal pain and mid chest since 1100 yesterday while at work. Patient also c/o nausea and emesis when the chest pain worsens. Patient states he has been "taking lots of Tums."

## 2020-05-16 NOTE — ED Provider Notes (Signed)
[facility] DEPT Provider Note   CSN: [id number] Arrival date & time: 05/16/20  8119     History Chief Complaint  Patient presents with  . Abdominal Pain  . Chest Pain    Bruce Cabrera is a 55 y.o. male.  Patient is a 55 year old male with a history of hypertension, diabetes and hyperlipidemia presenting today with complaint of epigastric discomfort which is now radiating up into his chest.  He reports symptoms started approximately 1 week ago.  It initially was a 1 or 2 out of 10 and was a dull achy pain that has gradually worsened and starting yesterday has started shooting up into his chest.  Eating does not seem to affect the pain but it may be a little worse if he does not eat.  He did eat jalapeno peppers yesterday and that made the pain significantly worse.  He has not had new cough or shortness of breath.  He has had occasional nausea since the pain started but no vomiting.  No change in bowel movements or urinary habits.  Patient reports that he has not been on any medications for the above medical problems for the last 3 years because he lost his job and insurance during [place] and then when he started a new job and got insurance back he attempted to call doctors but nobody was accepting new patients.  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  He does admit to drinking 3  16 ounce beers and 2-3 shots of liquor per day which has been unchanged for quite some time.  Since he has been having the pain he has been taking ibuprofen and Tums which only helps for a brief time.  Prior to that he did not take ibuprofen regularly.  He smokes cigarettes but does not use any drugs.  No prior history abdominal surgery or stomach ulcers.  The history is provided by the patient.  Abdominal Pain Pain location:  Epigastric Pain quality: aching and cramping   Pain radiates to:  Chest Pain severity:  Moderate Onset  quality:  Gradual Duration:  1 week Timing:  Constant Progression:  Worsening Chronicity:  New Context: alcohol use   Relieved by:  None tried Exacerbated by: Spicy food. Ineffective treatments:  None tried Associated symptoms: belching and chest pain   Associated symptoms: no anorexia, no cough, no diarrhea, no hematuria, no shortness of breath and no vomiting   Risk factors comment:  Prior history of diabetes, hypertension and hyperlipidemia as well as polycythemia but has not seen a doctor since 2019.  Daily heavy alcohol use Chest Pain Associated symptoms: abdominal pain   Associated symptoms: no anorexia, no cough, no shortness of breath and no vomiting        Past Medical History:  Diagnosis Date  . Diabetes mellitus without complication ([place])   . Hyperlipidemia associated with type 2 diabetes mellitus ([place])    based on 2011 profile  . Hypertension     Patient Active Problem List   Diagnosis Date Noted  . Polycythemia 12/26/2015  . Anginal chest pain at rest [facility]) 11/25/2013  . Diabetes mellitus ([place]) 11/25/2013  . DIABETES MELLITUS, TYPE II 10/13/2006  . HYPERLIPIDEMIA 10/13/2006  . Essential hypertension 10/13/2006  . OBESITY 10/10/2006    Past Surgical History:  Procedure Laterality Date  . FOOT SURGERY Left   . LEFT HEART CATHETERIZATION WITH CORONARY ANGIOGRAM N/A  11/29/2013   Procedure: LEFT HEART CATHETERIZATION WITH CORONARY ANGIOGRAM;  Surgeon: Troy Sine, MD;  Location: [facility] CATH LAB;  Service: Cardiovascular;  Laterality: N/A;  . None         Family History  Problem Relation Age of Onset  . Heart attack Mother 60  . Heart failure Brother 40    Social History   Tobacco Use  . Smoking status: Current Every Day Smoker    Packs/day: 0.50    Years: 30.00    Pack years: 15.00    Types: Cigarettes  . Smokeless tobacco: Never Used  Vaping Use  . Vaping Use: Never used  Substance Use Topics  . Alcohol use: Yes    Comment: 2- tallboys a day   . Drug use: No    Home Medications Prior to Admission medications   Medication Sig Start Date End Date Taking? Authorizing Provider  amLODipine (NORVASC) 5 MG tablet Take 1 tablet (5 mg total) by mouth daily. 05/17/17   Gareth Morgan, MD  aspirin EC 81 MG tablet Take 1 tablet (81 mg total) by mouth daily. 11/25/13   Gustavus Bryant, MD  Blood Glucose Monitoring Suppl (TRUE METRIX METER) w/Device KIT Test fasting blood sugars and at bedtime 11/17/15   Argentina Donovan, PA-C  cyclobenzaprine (FLEXERIL) 10 MG tablet Take 1 tablet (10 mg total) by mouth 2 (two) times daily as needed for muscle spasms. 05/17/17   Gareth Morgan, MD  glucose blood (TRUE METRIX BLOOD GLUCOSE TEST) test strip Use as instructed 11/17/15   Argentina Donovan, PA-C  losartan (COZAAR) 100 MG tablet Take 0.5 tablets (50 mg total) by mouth daily. 11/17/15   Argentina Donovan, PA-C  metFORMIN (GLUCOPHAGE) 1000 MG tablet Take 1 tablet (1,000 mg total) by mouth 2 (two) times daily with a meal. 11/17/15   McClung, Dionne Bucy, PA-C  metFORMIN (GLUCOPHAGE) 500 MG tablet Take 1 tablet (500 mg total) by mouth 2 (two) times daily. 05/17/17   Gareth Morgan, MD  metoprolol tartrate (LOPRESSOR) 25 MG tablet Take 1 tablet (25 mg total) by mouth 2 (two) times daily. 05/17/17   Gareth Morgan, MD  nitroGLYCERIN (NITROSTAT) 0.4 MG SL tablet Place 1 tablet (0.4 mg total) under the tongue every 5 (five) minutes as needed for chest pain. 11/25/13   Gustavus Bryant, MD  oxyCODONE-acetaminophen (PERCOCET) 5-325 MG tablet Take 1 tablet by mouth every 6 (six) hours as needed for severe pain. 04/27/19   Evelina Bucy, DPM  TRUEPLUS LANCETS 28G MISC Use as directed 11/17/15   Argentina Donovan, PA-C    Allergies    Patient has no known allergies.  Review of Systems   Review of Systems  Respiratory: Negative for cough and shortness of breath.   Cardiovascular: Positive for chest pain.  Gastrointestinal: Positive for abdominal pain. Negative for  anorexia, diarrhea and vomiting.  Endocrine: Positive for polydipsia and polyuria.  Genitourinary: Negative for hematuria.  All other systems reviewed and are negative.   Physical Exam Updated Vital Signs BP (!) 191/104 (BP Location: Left Arm)   Pulse 63   Temp 98 F (36.7 C) (Oral)   Resp (!) 22   Ht 6' 3"  (1.905 m)   Wt 127 kg   SpO2 100%   BMI 35.00 kg/m   Physical Exam Vitals and nursing note reviewed.  Constitutional:      General: He is not in acute distress.    Appearance: He is well-developed.  HENT:     Head:  Normocephalic and atraumatic.  Eyes:     Conjunctiva/sclera: Conjunctivae normal.     Pupils: Pupils are equal, round, and reactive to light.  Cardiovascular:     Rate and Rhythm: Normal rate and regular rhythm.     Heart sounds: No murmur heard.   Pulmonary:     Effort: Pulmonary effort is normal. No respiratory distress.     Breath sounds: Normal breath sounds. No wheezing or rales.  Abdominal:     General: Bowel sounds are normal. There is no distension.     Palpations: Abdomen is soft.     Tenderness: There is abdominal tenderness in the epigastric area. There is no guarding or rebound.  Musculoskeletal:        General: No tenderness. Normal range of motion.     Cervical back: Normal range of motion and neck supple.  Skin:    General: Skin is warm and dry.     Findings: No erythema or rash.  Neurological:     Mental Status: He is alert and oriented to person, place, and time.  Psychiatric:        Behavior: Behavior normal.     ED Results / Procedures / Treatments   Labs (all labs ordered are listed, but only abnormal results are displayed) Labs Reviewed  CBC - Abnormal; Notable for the following components:      Result Value   RBC 6.88 (*)    Hemoglobin 17.7 (*)    HCT 53.1 (*)    MCV 77.2 (*)    MCH 25.7 (*)    All other components within normal limits  COMPREHENSIVE METABOLIC PANEL - Abnormal; Notable for the following components:    Glucose, Bld 191 (*)    All other components within normal limits  LIPASE, BLOOD - Abnormal; Notable for the following components:   Lipase 366 (*)    All other components within normal limits  TROPONIN I (HIGH SENSITIVITY)  TROPONIN I (HIGH SENSITIVITY)    EKG EKG Interpretation  Date/Time:  Tuesday May 16 2020 08:55:16 EDT Ventricular Rate:  64 PR Interval:  187 QRS Duration: 98 QT Interval:  382 QTC Calculation: 395 R Axis:   -85 Text Interpretation: Sinus rhythm Left anterior fascicular block Consider anterior infarct 12 Lead; [place] No significant change since last tracing Confirmed by Blanchie Dessert [phone]) on 05/16/2020 9:34:34 AM   Radiology DG Chest 2 View  Result Date: 05/16/2020 CLINICAL DATA:  Chest pain EXAM: CHEST - 2 VIEW COMPARISON:  May 16, 2017 FINDINGS: Lungs are clear. Heart size and pulmonary vascularity are normal. No adenopathy. No pneumothorax. No bone lesions. IMPRESSION: Lungs clear.  Cardiac silhouette normal. Electronically Signed   By: Lowella Grip III M.D.   On: 05/16/2020 09:41    Procedures Procedures   Medications Ordered in ED Medications  alum & mag hydroxide-simeth (MAALOX/MYLANTA) 200-200-20 MG/5ML suspension 30 mL (has no administration in time range)    And  lidocaine (XYLOCAINE) 2 % viscous mouth solution 15 mL (has no administration in time range)    ED Course  I have reviewed the triage vital signs and the nursing notes.  Pertinent labs & imaging results that were available during my care of the patient were reviewed by me and considered in my medical decision making (see chart for details).    MDM Rules/Calculators/A&P                         Patient presenting today with  epigastric pain radiating into the chest which is most likely related to gastritis and possibly peptic ulcer disease.  Patient has been taking ibuprofen since it started which is probably not helping.  However prior to that he does use  tobacco products and drinks on a daily basis which would increase his risk for gastritis.  Patient has not had excessive vomiting and lower suspicion for hepatitis or pancreatitis.  He has no lower abdominal pain concerning for appendicitis or diverticulitis.  Patient denies any new chest cough or shortness of breath and the chest pain seems to be radiating from the abdomen.  Lower suspicion for ACS however patient does have risk factors of hypertension, hyperlipidemia and diabetes which have all been untreated now for several years.  EKG without acute findings today.  Chest x-ray within normal limits.  We will check a troponin to ensure no other acute issue.  Patient given GI cocktail.  Labs are pending.  11:38 AM Patient's CMP with a blood sugar of 191 but no other acute findings, lipase 366 and troponin within normal limits.  CBC with hemoglobin of 17 but otherwise within normal limits in the setting of patient having polycythemia and requiring phlebotomy in the past.  Suspect that patient's pancreatitis is the cause of his pain.  This is most likely for him his heavy daily alcohol use.  Findings discussed with the patient encourage cessation of alcohol, hydration and clear diet until pain has resolved.  Patient given a PPI, pain medication and restarted 1 blood pressure medication and Metformin.  Encourage patient to follow-up with PCP.  Given return precautions.  MDM Number of Diagnoses or Management Options   Amount and/or Complexity of Data Reviewed Clinical lab tests: ordered and reviewed Tests in the radiology section of CPT: ordered and reviewed Tests in the medicine section of CPT: ordered and reviewed Review and summarize past medical records: yes Discuss the patient with other providers: no Independent visualization of images, tracings, or specimens: yes  Risk of Complications, Morbidity, and/or Mortality Presenting problems: moderate Diagnostic procedures: moderate Management  options: low  Patient Progress Patient progress: stable   Final Clinical Impression(s) / ED Diagnoses Final diagnoses:  Alcohol-induced acute pancreatitis without infection or necrosis  Hypertension, unspecified type  Type 2 diabetes mellitus without complication, without long-term current use of insulin ([place])    Rx / DC Orders ED Discharge Orders         Ordered    oxyCODONE-acetaminophen (PERCOCET/ROXICET) 5-325 MG tablet  Every 6 hours PRN        05/16/20 1145    omeprazole (PRILOSEC) 20 MG capsule  Daily        05/16/20 1145    metFORMIN (GLUCOPHAGE) 500 MG tablet  2 times daily with meals        05/16/20 1145    amLODipine (NORVASC) 5 MG tablet  Daily        05/16/20 1145           Blanchie Dessert, MD 05/16/20 1145

## 2020-06-22 NOTE — Progress Notes (Signed)
Patient ID: Bruce Cabrera, male   DOB: [birth year], 55 y.o.   MRN: [id number]     Kerrie Latour, is a 55 y.o. male  FIE:[id number]  ZYS:[id number]  DOB - 1965-11-11  Subjective:  Chief Complaint and HPI: Bruce Cabrera is a 55 y.o. male here today to establish care and for a follow up visit After ED visit 3/29 for pancreatitis with a lipase of 366.  Also diabetic with no A1C since 2017(glucose 191 in ED).  He is taking metformin 530m twice daily for about 6 weeks.  No glucometer.  Only taking that and amlodipine currently.  Did not have refills on other meds.  Denies abdominal pain other than fleeting and not severe.  Denies appetite problems.  Sometimes a little constipated.  Has cut way down on alcohol intake.  He works swing shifts and would like something to help him with sleep.    From ED A/P: Patient presenting today with epigastric pain radiating into the chest which is most likely related to gastritis and possibly peptic ulcer disease.  Patient has been taking ibuprofen since it started which is probably not helping.  However prior to that he does use tobacco products and drinks on a daily basis which would increase his risk for gastritis.  Patient has not had excessive vomiting and lower suspicion for hepatitis or pancreatitis.  He has no lower abdominal pain concerning for appendicitis or diverticulitis.  Patient denies any new chest cough or shortness of breath and the chest pain seems to be radiating from the abdomen.  Lower suspicion for ACS however patient does have risk factors of hypertension, hyperlipidemia and diabetes which have all been untreated now for several years.  EKG without acute findings today.  Chest x-ray within normal limits.  We will check a troponin to ensure no other acute issue.  Patient given GI cocktail.  Labs are pending.  11:38 AM Patient's CMP with a blood sugar of 191 but no other acute findings, lipase 366 and troponin within normal limits.  CBC with hemoglobin of  17 but otherwise within normal limits in the setting of patient having polycythemia and requiring phlebotomy in the past.  Suspect that patient's pancreatitis is the cause of his pain.  This is most likely for him his heavy daily alcohol use.  Findings discussed with the patient encourage cessation of alcohol, hydration and clear diet until pain has resolved.  Patient given a PPI, pain medication and restarted 1 blood pressure medication and Metformin.  Encourage patient to follow-up with PCP.  Given return precautions.   ED/Hospital notes reviewed.    ROS:   Constitutional:  No f/c, No night sweats, No unexplained weight loss. EENT:  No vision changes, No blurry vision, No hearing changes. No mouth, throat, or ear problems.  Respiratory: No cough, No SOB Cardiac: No CP, no palpitations GI:  Occasional, mild abd pain, No N/V/D. GU: No Urinary s/sx Musculoskeletal: No joint pain Neuro: No headache, no dizziness, no motor weakness.  Skin: No rash Endocrine:  No polydipsia. No polyuria.  Psych: Denies SI/HI  No problems updated.  ALLERGIES: No Known Allergies  PAST MEDICAL HISTORY: Past Medical History:  Diagnosis Date  . Diabetes mellitus without complication ([facility]   . Hyperlipidemia associated with type 2 diabetes mellitus ([facility]    based on 2011 profile  . Hypertension     MEDICATIONS AT HOME: Prior to Admission medications   Medication Sig Start Date End Date Taking? Authorizing Provider  aspirin EC 81  MG tablet Take 1 tablet (81 mg total) by mouth daily. 11/25/13  Yes Gustavus Bryant, MD  cyclobenzaprine (FLEXERIL) 10 MG tablet Take 1 tablet (10 mg total) by mouth 2 (two) times daily as needed for muscle spasms. 05/17/17  Yes Gareth Morgan, MD  nitroGLYCERIN (NITROSTAT) 0.4 MG SL tablet Place 1 tablet (0.4 mg total) under the tongue every 5 (five) minutes as needed for chest pain. 11/25/13  Yes Gustavus Bryant, MD  traZODone (DESYREL) 50 MG tablet Take 0.5-1 tablets (25-50 mg  total) by mouth at bedtime as needed for sleep. 06/28/20  Yes Freeman Caldron M, PA-C  amLODipine (NORVASC) 5 MG tablet Take 1 tablet (5 mg total) by mouth daily. 06/28/20   Argentina Donovan, PA-C  Blood Glucose Monitoring Suppl (TRUE METRIX METER) w/Device KIT Test fasting blood sugars and at bedtime 06/28/20   Freeman Caldron M, PA-C  glucose blood (TRUE METRIX BLOOD GLUCOSE TEST) test strip Use as instructed 06/28/20   Argentina Donovan, PA-C  losartan (COZAAR) 100 MG tablet Take 0.5 tablets (50 mg total) by mouth daily. 06/28/20   Argentina Donovan, PA-C  metFORMIN (GLUCOPHAGE) 500 MG tablet Take 2 tablets (1,000 mg total) by mouth 2 (two) times daily with a meal. 06/28/20   Welma Mccombs, Dionne Bucy, PA-C  metoprolol tartrate (LOPRESSOR) 25 MG tablet Take 1 tablet (25 mg total) by mouth 2 (two) times daily. 06/28/20   Argentina Donovan, PA-C  omeprazole (PRILOSEC) 20 MG capsule Take 1 capsule (20 mg total) by mouth daily. 06/28/20   Argentina Donovan, PA-C  oxyCODONE-acetaminophen (PERCOCET/ROXICET) 5-325 MG tablet Take 1 tablet by mouth every 6 (six) hours as needed for severe pain. Patient not taking: Reported on 06/28/2020 05/16/20   Blanchie Dessert, MD  TRUEplus Lancets 28G MISC Use as directed 06/28/20   Argentina Donovan, PA-C     Objective:  EXAM:   Vitals:   06/28/20 1406  BP: (!) 158/96  Pulse: 66  Resp: 18  SpO2: 96%  Weight: 262 lb 12.8 oz (119.2 kg)  Height: 6' 3"  (1.905 m)    General appearance : A&OX3. NAD. Non-toxic-appearing HEENT: Atraumatic and Normocephalic.  PERRLA. EOM intact.  Neck: supple, no JVD. No cervical lymphadenopathy. No thyromegaly Chest/Lungs:  Breathing-non-labored, Good air entry bilaterally, breath sounds normal without rales, rhonchi, or wheezing  CVS: S1 S2 regular, no murmurs, gallops, rubs  Extremities: Bilateral Lower Ext shows no edema, both legs are warm to touch with = pulse throughout Neurology:  CN II-XII grossly intact, Non focal.   Psych:  TP  linear. J/I fair. Normal speech. Appropriate eye contact and affect.  Skin:  No Rash  Data Review Lab Results  Component Value Date   HGBA1C 10.9 (A) 06/28/2020   HGBA1C 8.8 11/17/2015   HGBA1C 12.5 (H) 11/25/2013     Assessment & Plan   1. Type 2 diabetes mellitus with hyperglycemia, unspecified whether long term insulin use (HCC) Uncontrolled but only on low dose metformin for about 5-6 weeks.  Glucometer ordered.  Increase dose metformin.  Check blood sugars fasting and bedtime and bring to next visit - Glucose (CBG) - HgB A1c - Blood Glucose Monitoring Suppl (TRUE METRIX METER) w/Device KIT; Test fasting blood sugars and at bedtime  Dispense: 1 kit; Refill: 0 - glucose blood (TRUE METRIX BLOOD GLUCOSE TEST) test strip; Use as instructed  Dispense: 100 each; Refill: 12 - TRUEplus Lancets 28G MISC; Use as directed  Dispense: 1 each; Refill: 3 - metFORMIN (GLUCOPHAGE) 500  MG tablet; Take 2 tablets (1,000 mg total) by mouth 2 (two) times daily with a meal.  Dispense: 120 tablet; Refill: 2  2. Essential hypertension Uncontrolled-only taking amlodipine-resume other meds too - amLODipine (NORVASC) 5 MG tablet; Take 1 tablet (5 mg total) by mouth daily.  Dispense: 30 tablet; Refill: 2 - losartan (COZAAR) 100 MG tablet; Take 0.5 tablets (50 mg total) by mouth daily.  Dispense: 45 tablet; Refill: 1 - metoprolol tartrate (LOPRESSOR) 25 MG tablet; Take 1 tablet (25 mg total) by mouth 2 (two) times daily.  Dispense: 60 tablet; Refill: 0 - Comprehensive metabolic panel  3. Polycythemia - CBC with Differential/Platelet  4. Alcohol-induced chronic pancreatitis ([place]) [place].org is the website for narcotics anonymous [place] (website) or 318-597-3691 is the information for alcoholics anonymous Both are free and immediately available for help with alcohol and drug use I have counseled the patient at length about substance abuse and addiction.  12 step meetings/recovery recommended.   Local 12 step meeting lists were given and attendance was encouraged.  Patient expresses understanding.  - omeprazole (PRILOSEC) 20 MG capsule; Take 1 capsule (20 mg total) by mouth daily.  Dispense: 14 capsule; Refill: 0 - Lipase  5. Insomnia, unspecified type - traZODone (DESYREL) 50 MG tablet; Take 0.5-1 tablets (25-50 mg total) by mouth at bedtime as needed for sleep.  Dispense: 30 tablet; Refill: 3  6. Hospital discharge follow-up     Patient have been counseled extensively about nutrition and exercise  Return for 3-4 weeks with Lurena Joiner for BP and DM and 3 months to assign PCP.  The patient was given clear instructions to go to ER or return to medical center if symptoms don't improve, worsen or new problems develop. The patient verbalized understanding. The patient was told to call to get lab results if they haven't heard anything in the next week.     Freeman Caldron, PA-C [facility] and Garey Rowan, [place]   06/28/2020, 2:22 PM

## 2020-06-28 ENCOUNTER — Encounter (INDEPENDENT_AMBULATORY_CARE_PROVIDER_SITE_OTHER): Payer: Self-pay

## 2020-06-28 ENCOUNTER — Ambulatory Visit: Payer: BC Managed Care – PPO | Attending: Physician Assistant | Admitting: Physician Assistant

## 2020-06-28 ENCOUNTER — Encounter: Payer: Self-pay | Admitting: Physician Assistant

## 2020-06-28 ENCOUNTER — Other Ambulatory Visit: Payer: Self-pay

## 2020-06-28 VITALS — BP 158/96 | HR 66 | Resp 18 | Ht 75.0 in | Wt 262.8 lb

## 2020-06-28 DIAGNOSIS — K860 Alcohol-induced chronic pancreatitis: Secondary | ICD-10-CM

## 2020-06-28 DIAGNOSIS — D751 Secondary polycythemia: Secondary | ICD-10-CM | POA: Diagnosis not present

## 2020-06-28 DIAGNOSIS — E1165 Type 2 diabetes mellitus with hyperglycemia: Secondary | ICD-10-CM

## 2020-06-28 DIAGNOSIS — I10 Essential (primary) hypertension: Secondary | ICD-10-CM

## 2020-06-28 DIAGNOSIS — Z09 Encounter for follow-up examination after completed treatment for conditions other than malignant neoplasm: Secondary | ICD-10-CM

## 2020-06-28 DIAGNOSIS — G4700 Insomnia, unspecified: Secondary | ICD-10-CM

## 2020-06-28 LAB — POCT GLYCOSYLATED HEMOGLOBIN (HGB A1C): HbA1c, POC (controlled diabetic range): 10.9 % — AB (ref 0.0–7.0)

## 2020-06-28 LAB — GLUCOSE, POCT (MANUAL RESULT ENTRY): POC Glucose: 207 mg/dl — AB (ref 70–99)

## 2020-06-28 MED ORDER — METOPROLOL TARTRATE 25 MG PO TABS: 25.0000 mg | ORAL_TABLET | Freq: Two times a day (BID) | ORAL | 2 refills | Status: DC

## 2020-06-28 MED ORDER — TRUEPLUS LANCETS 28G MISC: 3 refills | Status: DC

## 2020-06-28 MED ORDER — METFORMIN HCL 500 MG PO TABS: 1000.0000 mg | ORAL_TABLET | Freq: Two times a day (BID) | ORAL | 2 refills | Status: DC

## 2020-06-28 MED ORDER — TRUE METRIX METER W/DEVICE KIT: PACK | 0 refills | Status: DC

## 2020-06-28 MED ORDER — TRUE METRIX BLOOD GLUCOSE TEST VI STRP: ORAL_STRIP | 12 refills | Status: DC

## 2020-06-28 MED ORDER — METOPROLOL TARTRATE 25 MG PO TABS: 25.0000 mg | ORAL_TABLET | Freq: Two times a day (BID) | ORAL | 0 refills | Status: DC

## 2020-06-28 MED ORDER — LOSARTAN POTASSIUM 100 MG PO TABS: 50.0000 mg | ORAL_TABLET | Freq: Every day | ORAL | 1 refills | Status: DC

## 2020-06-28 MED ORDER — METFORMIN HCL 500 MG PO TABS: 500.0000 mg | ORAL_TABLET | Freq: Two times a day (BID) | ORAL | 2 refills | Status: DC

## 2020-06-28 MED ORDER — TRAZODONE HCL 50 MG PO TABS: 25.0000 mg | ORAL_TABLET | Freq: Every evening | ORAL | 3 refills | Status: DC | PRN

## 2020-06-28 MED ORDER — OMEPRAZOLE 20 MG PO CPDR: 20.0000 mg | DELAYED_RELEASE_CAPSULE | Freq: Every day | ORAL | 0 refills | Status: DC

## 2020-06-28 MED ORDER — AMLODIPINE BESYLATE 5 MG PO TABS
5.0000 mg | ORAL_TABLET | Freq: Every day | ORAL | 2 refills | Status: DC
Start: 2020-06-28 — End: 2020-07-20

## 2020-06-28 NOTE — Patient Instructions (Signed)
Check blood sugars fasting and bedtime and record and bring to next visit  Drink 80-100 ounces water daily.  Avoid all alcohol intake  [place].org is the website for narcotics anonymous [place] (website) or 478-053-0816 is the information for alcoholics anonymous Both are free and immediately available for help with alcohol and drug use

## 2020-06-29 LAB — COMPREHENSIVE METABOLIC PANEL
ALT: 10 IU/L (ref 0–44)
AST: 11 IU/L (ref 0–40)
Albumin/Globulin Ratio: 1.3 (ref 1.2–2.2)
Albumin: 3.4 g/dL — ABNORMAL LOW (ref 3.8–4.9)
Alkaline Phosphatase: 55 IU/L (ref 44–121)
BUN/Creatinine Ratio: 13 (ref 9–20)
BUN: 13 mg/dL (ref 6–24)
Bilirubin Total: 0.4 mg/dL (ref 0.0–1.2)
CO2: 24 mmol/L (ref 20–29)
Calcium: 9.1 mg/dL (ref 8.7–10.2)
Chloride: 103 mmol/L (ref 96–106)
Creatinine, Ser: 1.02 mg/dL (ref 0.76–1.27)
Globulin, Total: 2.7 g/dL (ref 1.5–4.5)
Glucose: 159 mg/dL — ABNORMAL HIGH (ref 65–99)
Potassium: 4.1 mmol/L (ref 3.5–5.2)
Sodium: 140 mmol/L (ref 134–144)
Total Protein: 6.1 g/dL (ref 6.0–8.5)
eGFR: 88 mL/min/{1.73_m2} (ref >59)

## 2020-06-29 LAB — LIPASE: Lipase: 69 U/L (ref 13–78)

## 2020-07-20 ENCOUNTER — Ambulatory Visit: Payer: BC Managed Care – PPO | Attending: Critical Care Medicine | Admitting: Pharmacist

## 2020-07-20 ENCOUNTER — Encounter: Payer: Self-pay | Admitting: Oncology

## 2020-07-20 ENCOUNTER — Other Ambulatory Visit: Payer: Self-pay

## 2020-07-20 ENCOUNTER — Encounter: Payer: Self-pay | Admitting: Pharmacist

## 2020-07-20 DIAGNOSIS — E1165 Type 2 diabetes mellitus with hyperglycemia: Secondary | ICD-10-CM

## 2020-07-20 DIAGNOSIS — G4700 Insomnia, unspecified: Secondary | ICD-10-CM | POA: Diagnosis not present

## 2020-07-20 DIAGNOSIS — I10 Essential (primary) hypertension: Secondary | ICD-10-CM | POA: Diagnosis not present

## 2020-07-20 MED ORDER — METFORMIN HCL 500 MG PO TABS
1000.0000 mg | ORAL_TABLET | Freq: Two times a day (BID) | ORAL | 2 refills | Status: DC
  Filled 2020-07-20: qty 120, 30d supply, fill #0

## 2020-07-20 MED ORDER — LOSARTAN POTASSIUM 100 MG PO TABS
50.0000 mg | ORAL_TABLET | Freq: Every day | ORAL | 1 refills | Status: DC
  Filled 2020-07-20: qty 15, 30d supply, fill #0

## 2020-07-20 MED ORDER — METOPROLOL TARTRATE 25 MG PO TABS
25.0000 mg | ORAL_TABLET | Freq: Two times a day (BID) | ORAL | 2 refills | Status: DC
  Filled 2020-07-20: qty 60, 30d supply, fill #0

## 2020-07-20 MED ORDER — AMLODIPINE BESYLATE 5 MG PO TABS
5.0000 mg | ORAL_TABLET | Freq: Every day | ORAL | 2 refills | Status: DC
  Filled 2020-07-20: qty 30, 30d supply, fill #0

## 2020-07-20 MED ORDER — TRAZODONE HCL 50 MG PO TABS
25.0000 mg | ORAL_TABLET | Freq: Every evening | ORAL | 3 refills | Status: DC | PRN
  Filled 2020-07-20: qty 30, 30d supply, fill #0

## 2020-07-20 NOTE — Patient Instructions (Signed)
Thank you for coming to see me today. Please do the following:  1. We sent your prescriptions to our pharmacy. You can pick those up tomorrow.  2. Continue checking blood sugars at home. 3. Continue making the lifestyle changes we've discussed together during our visit. Diet and exercise play a significant role in improving your blood sugars.  4. Follow-up with me in 2-3 weeks.    Hypoglycemia or low blood sugar:   Low blood sugar can happen quickly and may become an emergency if not treated right away.   While this shouldn't happen often, it can be brought upon if you skip a meal or do not eat enough. Also, if your insulin or other diabetes medications are dosed too high, this can cause your blood sugar to go to low.   Warning signs of low blood sugar include: 1. Feeling shaky or dizzy 2. Feeling weak or tired  3. Excessive hunger 4. Feeling anxious or upset  5. Sweating even when you aren't exercising  What to do if I experience low blood sugar? 1. Check your blood sugar with your meter. If lower than 70, proceed to step 2.  2. Treat with 3-4 glucose tablets or 3 packets of regular sugar. If these aren't around, you can try hard candy. Yet another option would be to drink 4 ounces of fruit juice or 6 ounces of REGULAR soda.  3. Re-check your sugar in 15 minutes. If it is still below 70, do what you did in step 2 again. If has come back up, go ahead and eat a snack or small meal at this time.

## 2020-07-20 NOTE — Progress Notes (Signed)
S:    PCP: to est with Dr. Joya Gaskins.   Patient arrives in good spirits. Presents for diabetes evaluation, education, and management. Patient was referred on 06/28/2020 by Levada Dy. At that visit, pt reported adherence with metformin but was not taking losartan or metoprolol.    Today, patient reports Diabetes was diagnosed ~10 years ago. He has been managed with metformin and lifestyle changes per pt. Never has used insulin. Positive history of alcohol-induced pancreatitis. No personal hx of CKD or CHF. No personal hx of ACS/CAD, stroke.    In regards to his HTN, he is still not taking the losartan or metoprolol d/t cost. Denies chest pain, dyspnea, HA or blurred vision. Denies LE edema.   Family/Social History:  -FHx: MI, ESRD, stroke -Tobacco: current every day smoker  -Alcohol: denies current use   Insurance coverage/medication affordability: BCBS  Medication adherence denied. Unfortunately, pt did not pick up rxs d/t lack of funds. Has been without all medication for ~4-5 days.    Current diabetes medications include: metformin 1000mg  BID (not taking) Current hypertension medications include: amlodipine 5 mg daily, losartan 100 mg daily (not taking) metoprolol 25 mg BID (not taking) Current hyperlipidemia medications include: none  Patient denies hypoglycemic events.  Patient reported dietary habits:  - Admits to some dietary indiscretion - Drinks mostly water; will have the occasional soda  - Tries to limit sweets but admits to high consumption of pasta   Patient-reported exercise habits:  - Mainly through work; he works overnight   Patient denies nocturia (nighttime urination).  Patient denies neuropathy (nerve pain). Patient reports visual changes. Patient reports self foot exams.     O:  POCT glucose: 186  Lab Results  Component Value Date   HGBA1C 10.9 (A) 06/28/2020   Vitals:   07/20/20 1727  BP: (!) 155/95   Lipid Panel     Component Value Date/Time    CHOL 236 (H) 11/25/2013 1639   CHOL 243 (H) 11/25/2013 1639   TRIG 101 11/25/2013 1639   HDL 34 (L) 11/25/2013 1639   CHOLHDL 7.1 11/25/2013 1639   VLDL 20 11/25/2013 1639   LDLCALC 189 (H) 11/25/2013 1639   Home fasting blood sugars: none   2 hour post-meal/random blood sugars: none .  Clinical Atherosclerotic Cardiovascular Disease (ASCVD): No  The ASCVD Risk score Mikey Bussing DC Jr., et al., 2013) failed to calculate for the following reasons:   Cannot find a previous HDL lab   Cannot find a previous total cholesterol lab    A/P: Diabetes longstanding currently uncontrolled. Patient is able to verbalize appropriate hypoglycemia management plan. Medication adherence suboptimal. Will send rxs to our pharmacy so he can pick up and begin metformin today. No changes until we can get some home readings while pt is on therapy. -Continued current regimen.  -Extensively discussed pathophysiology of diabetes, recommended lifestyle interventions, dietary effects on blood sugar control -Counseled on s/sx of and management of hypoglycemia -Next A1C anticipated 09/2020.   ASCVD risk - primary prevention in patient with diabetes. Last LDL is not controlled but from 2015. Needs lipid panel. Priority given to DM care/pharmacy options today. Will address at follow-up. He is not on a statin. He'll need at least moderate intensity but may benefit from high intensity statin therapy.   Hypertension longstanding currently above goal.  Blood pressure goal = 130/80 mmHg. Medication adherence denied.  No med changes until we can reassess with patient taking his medications. -Continued current regimen.  -BP medications sent  to our pharmacy.   Written patient instructions provided.  Total time in face to face counseling 30 minutes.   Follow up PCP Clinic Visit in 1 month.    Benard Halsted, PharmD, [place], [place] [phone]

## 2020-07-21 LAB — LIPID PANEL
Chol/HDL Ratio: 4.7 ratio (ref 0.0–5.0)
Cholesterol, Total: 242 mg/dL — ABNORMAL HIGH (ref 100–199)
HDL: 51 mg/dL (ref >39)
LDL Chol Calc (NIH): 169 mg/dL — ABNORMAL HIGH (ref 0–99)
Triglycerides: 124 mg/dL (ref 0–149)
VLDL Cholesterol Cal: 22 mg/dL (ref 5–40)

## 2020-07-21 LAB — MICROALBUMIN / CREATININE URINE RATIO
Creatinine, Urine: 190.4 mg/dL
Microalb/Creat Ratio: 2032 mg/g creat — ABNORMAL HIGH (ref 0–29)
Microalbumin, Urine: 3868.8 ug/mL

## 2020-07-24 ENCOUNTER — Other Ambulatory Visit: Payer: Self-pay

## 2020-07-24 ENCOUNTER — Other Ambulatory Visit: Payer: Self-pay | Admitting: Family Medicine

## 2020-07-24 ENCOUNTER — Telehealth: Payer: Self-pay | Admitting: Pharmacist

## 2020-07-24 MED ORDER — ATORVASTATIN CALCIUM 40 MG PO TABS
40.0000 mg | ORAL_TABLET | Freq: Every day | ORAL | 3 refills | Status: DC
  Filled 2020-07-24: qty 30, 30d supply, fill #0

## 2020-07-24 NOTE — Telephone Encounter (Signed)
Call returned. We discussed lab findings with recommendations from Dr. Margarita Rana. Pt verbalized understanding with no further questions.

## 2020-07-24 NOTE — Telephone Encounter (Signed)
-----   Message from Charlott Rakes, MD sent at 07/24/2020  4:25 AM EDT ----- Please inform him I have sent a prescription of Atorvastatin to his Pharmacy due to elevated lipid levels and compliance with a low cholesterol diet and exercise will be beneficial. Thanks.

## 2020-07-24 NOTE — Telephone Encounter (Signed)
Call placed to pt to discuss lab findings and addition of atorvastatin. No answer. Left HIPAA-compliant VM with instructions to return my call.

## 2020-07-24 NOTE — Telephone Encounter (Signed)
Please call pt back to discuss the message below.

## 2020-07-27 ENCOUNTER — Other Ambulatory Visit: Payer: Self-pay

## 2020-07-31 ENCOUNTER — Other Ambulatory Visit: Payer: Self-pay

## 2020-08-23 ENCOUNTER — Encounter: Payer: Self-pay | Admitting: Oncology

## 2020-08-23 ENCOUNTER — Ambulatory Visit: Payer: BC Managed Care – PPO | Attending: Critical Care Medicine | Admitting: Critical Care Medicine

## 2020-08-23 ENCOUNTER — Other Ambulatory Visit: Payer: Self-pay

## 2020-08-23 ENCOUNTER — Encounter: Payer: Self-pay | Admitting: Critical Care Medicine

## 2020-08-23 DIAGNOSIS — M25551 Pain in right hip: Secondary | ICD-10-CM

## 2020-08-23 DIAGNOSIS — Z720 Tobacco use: Secondary | ICD-10-CM | POA: Insufficient documentation

## 2020-08-23 DIAGNOSIS — E1165 Type 2 diabetes mellitus with hyperglycemia: Secondary | ICD-10-CM

## 2020-08-23 DIAGNOSIS — I10 Essential (primary) hypertension: Secondary | ICD-10-CM | POA: Diagnosis not present

## 2020-08-23 DIAGNOSIS — I208 Other forms of angina pectoris: Secondary | ICD-10-CM | POA: Diagnosis not present

## 2020-08-23 MED ORDER — TRUE METRIX BLOOD GLUCOSE TEST VI STRP
ORAL_STRIP | 12 refills | Status: DC
  Filled 2020-08-23: qty 100, fill #0

## 2020-08-23 MED ORDER — VALSARTAN-HYDROCHLOROTHIAZIDE 320-25 MG PO TABS
1.0000 | ORAL_TABLET | Freq: Every day | ORAL | 3 refills | Status: DC
  Filled 2020-08-23: qty 90, 90d supply, fill #0
  Filled 2020-11-30: qty 90, 90d supply, fill #1
  Filled 2021-03-07: qty 90, 90d supply, fill #0
  Filled 2021-06-05: qty 90, 90d supply, fill #1

## 2020-08-23 MED ORDER — ATORVASTATIN CALCIUM 40 MG PO TABS
40.0000 mg | ORAL_TABLET | Freq: Every day | ORAL | 3 refills | Status: DC
  Filled 2020-08-23: qty 30, 30d supply, fill #0
  Filled 2020-11-30: qty 30, 30d supply, fill #1

## 2020-08-23 MED ORDER — ACCU-CHEK SOFTCLIX LANCETS MISC
12 refills | Status: DC
Start: 2020-08-23 — End: 2023-04-09
  Filled 2020-08-23: qty 100, 30d supply, fill #0

## 2020-08-23 MED ORDER — ACCU-CHEK GUIDE W/DEVICE KIT
PACK | 0 refills | Status: DC
  Filled 2020-08-23 (×2): qty 1, 365d supply, fill #0

## 2020-08-23 MED ORDER — TRUEPLUS LANCETS 28G MISC
3 refills | Status: DC
  Filled 2020-08-23: qty 1, fill #0

## 2020-08-23 MED ORDER — ACCU-CHEK GUIDE VI STRP
ORAL_STRIP | 12 refills | Status: DC
  Filled 2020-08-23: qty 100, 30d supply, fill #0

## 2020-08-23 MED ORDER — METFORMIN HCL 1000 MG PO TABS
1000.0000 mg | ORAL_TABLET | Freq: Two times a day (BID) | ORAL | 3 refills | Status: DC
  Filled 2020-08-23: qty 180, 90d supply, fill #0
  Filled 2020-11-30: qty 180, 90d supply, fill #1
  Filled 2021-03-07: qty 180, 90d supply, fill #0
  Filled 2021-06-05: qty 180, 90d supply, fill #1

## 2020-08-23 MED ORDER — TRUE METRIX METER W/DEVICE KIT
PACK | 0 refills | Status: DC
  Filled 2020-08-23: qty 1, fill #0

## 2020-08-23 MED ORDER — METOPROLOL SUCCINATE ER 50 MG PO TB24
50.0000 mg | ORAL_TABLET | Freq: Every day | ORAL | 3 refills | Status: DC
Start: 2020-08-23 — End: 2021-07-23
  Filled 2020-08-23: qty 90, 90d supply, fill #0
  Filled 2020-11-30: qty 90, 90d supply, fill #1
  Filled 2021-03-07: qty 90, 90d supply, fill #0
  Filled 2021-06-05: qty 90, 90d supply, fill #1

## 2020-08-23 MED ORDER — CYCLOBENZAPRINE HCL 10 MG PO TABS
10.0000 mg | ORAL_TABLET | Freq: Two times a day (BID) | ORAL | 0 refills | Status: DC | PRN
  Filled 2020-08-23: qty 40, 20d supply, fill #0

## 2020-08-23 NOTE — Assessment & Plan Note (Addendum)
Patient has not been able to take medications or monitor blood pressure at home due to financial and transportation barriers. Provided patient education regarding healthy diet.  Take valsartan-hydrochlorothiazide 320-25 mg daily.  Take metoprolol 50 mg daily.  Follow up at next visit.

## 2020-08-23 NOTE — Assessment & Plan Note (Signed)
Patient with right hip pain which is worse in the mornings with associated back pain. Refilled Flexeril 10 mg daily. Follow up at next visit.

## 2020-08-23 NOTE — Assessment & Plan Note (Signed)
Patient has not been taking medications on monitoring blood sugar levels due to multiple barriers including cost and transportation.  Discussed healthy eating education. Prescribed glucometer, strips, and lancets. Monitor blood sugar daily.  Take metformin 1000 mg twice daily.  Follow up at next visit.

## 2020-08-23 NOTE — Assessment & Plan Note (Signed)
Patient with history of angina and mild coronary artery disease. Patient has not been taking aspirin or metoprolol recently.  Take metoprolol 50 mg daily. Follow up at next visit.

## 2020-08-23 NOTE — Progress Notes (Signed)
New Patient Office Visit  Subjective:  Patient ID: Bruce Cabrera, male    DOB: 12-18-1965  Age: 55 y.o. MRN: [id number] Virtual Visit via Video Note  I connected with [name]  on 08/23/20 at 9am @ by a video enabled telemedicine application and verified that I am speaking with the correct person using two identifiers.   Consent:  I discussed the limitations, risks, security and privacy concerns of performing an evaluation and management service by video visit and the availability of in person appointments. I also discussed with the patient that there may be a patient responsible charge related to this service. The patient expressed understanding and agreed to proceed.  Location of patient: pt is at home  Location of provider:I am in my office  Persons participating in the televisit with the patient.    No one else on the call   History of Present Illness:   CC: Est care, DM HTN f/u   HPI Bruce Cabrera presents for a virtual visit to establish care. The patient has a history of diabetes and hypertension. The patient saw the clinical pharmacist in this clinic on 07/20/2020 and was prescribed atorvastatin 40 mg, amlodipine 5 mg, losartan 50 mg, metformin 1000 mg twice daily, metoprolol 25 mg twice daily, and trazodone 50 mg at bedtime. The patient has not been taking any of these medications and has not picked up any of these medication due to multiple barriers including the cost of his copay and transportation. The patient does not check his blood pressure or blood sugar at home because he has not been able to afford a blood pressure cuff or blood sugar meter. The patient has not been taking aspirin because he read something on the news about aspirin being unhealthy for you.   The patient has been experiencing right hip pain with associated back pain. He states the hip pain is worse in the morning and improves when he walks around. He has taken Flexeril in the past for this pain which  was helpful.  The patient states he smokes about 1 pack of cigarettes daily. He works at a [employer], [employer] where he gets cigarettes, which makes it difficult to cut down on his smoking. He has never tried nicotine replacement products in the past.   Past Medical History:  Diagnosis Date   Diabetes mellitus without complication ([place])    Hyperlipidemia associated with type 2 diabetes mellitus ([place])    based on 2011 profile   Hypertension     Past Surgical History:  Procedure Laterality Date   FOOT SURGERY Left    LEFT HEART CATHETERIZATION WITH CORONARY ANGIOGRAM N/A 11/29/2013   Procedure: LEFT HEART CATHETERIZATION WITH CORONARY ANGIOGRAM;  Surgeon: Troy Sine, MD;  [facility]: [facility] CATH LAB;  Service: Cardiovascular;  Laterality: N/A;   None      Family History  Problem Relation Age of Onset   Heart attack Mother 67   Heart failure Brother 44   Stroke Sister     Social History   Socioeconomic History   Marital status: Single    Spouse name: Not on file   Number of children: Not on file   Years of education: Not on file   Highest education level: Not on file  Occupational History   Occupation: Counsellor at [employer]  Tobacco Use   Smoking status: Every Day    Packs/day: 0.50    Years: 30.00    Pack years: 15.00  Types: Cigarettes   Smokeless tobacco: Never  Vaping Use   Vaping Use: Never used  Substance and Sexual Activity   Alcohol use: Yes    Comment: 2- tallboys a day   Drug use: No   Sexual activity: Not on file  Other Topics Concern   Not on file  Social History Narrative   Lives with roommate   Social Determinants of Health   Financial Resource Strain: Not on file  Food Insecurity: Not on file  Transportation Needs: Not on file  Physical Activity: Not on file  Stress: Not on file  Social Connections: Not on file  Intimate Partner Violence: Not on file    ROS Review of Systems  Constitutional: Negative.   HENT: Negative.    Eyes:  Negative.   Respiratory: Negative.    Gastrointestinal: Negative.   Genitourinary: Negative.   Musculoskeletal:  Positive for arthralgias and back pain.       Right hip pain  Skin: Negative.   Neurological: Negative.   Psychiatric/Behavioral: Negative.     Objective:   Today's Vitals: There were no vitals taken for this visit.  Physical Exam   Physical exam not performed due to visit performed virtually with video access today. Patient is not in acute distress.  CHL reviewed all records reviewed Assessment & Plan:   Problem List Items Addressed This Visit       Cardiovascular and Mediastinum   Essential hypertension    Patient has not been able to take medications or monitor blood pressure at home due to financial and transportation barriers. Provided patient education regarding healthy diet.  Take valsartan-hydrochlorothiazide 320-25 mg daily.  Take metoprolol 50 mg daily.  Follow up at next visit.        Relevant Medications   atorvastatin (LIPITOR) 40 MG tablet   valsartan-hydrochlorothiazide (DIOVAN-HCT) 320-25 MG tablet   metoprolol succinate (TOPROL-XL) 50 MG 24 hr tablet   Anginal chest pain at rest [facility])    Patient with history of angina and mild coronary artery disease. Patient has not been taking aspirin or metoprolol recently.  Take metoprolol 50 mg daily. Follow up at next visit.        Relevant Medications   atorvastatin (LIPITOR) 40 MG tablet   valsartan-hydrochlorothiazide (DIOVAN-HCT) 320-25 MG tablet   metoprolol succinate (TOPROL-XL) 50 MG 24 hr tablet     Endocrine   Diabetes mellitus ([place]) - Primary    Patient has not been taking medications on monitoring blood sugar levels due to multiple barriers including cost and transportation.  Discussed healthy eating education. Prescribed glucometer, strips, and lancets. Monitor blood sugar daily.  Take metformin 1000 mg twice daily.  Follow up at next visit.        Relevant Medications    atorvastatin (LIPITOR) 40 MG tablet   metFORMIN (GLUCOPHAGE) 1000 MG tablet   valsartan-hydrochlorothiazide (DIOVAN-HCT) 320-25 MG tablet     Other   Tobacco use    Patient smoking one pack daily. 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  Advised smoking cessation. Advised can try nicotine replacement products to assist with quitting tobacco. Follow up at next visit.      Current smoking consumption amount: 1PPD  Dicsussion on advise to quit smoking and smoking impacts: cv impacts lung impacts  Patient's willingness to quit:  Not ready  Methods to quit smoking discussed:  behav mod/ nicotine replacemetn  Medication management of smoking session drugs discussed: nicotine patch   Setting quit date no  established  Follow-up arranged  4-6 weeks    Time spent counseling the patient:  5 min        Right hip pain    Patient with right hip pain which is worse in the mornings with associated back pain. Refilled Flexeril 10 mg daily. Follow up at next visit.         Outpatient Encounter Medications as of 08/23/2020  Medication Sig   Accu-Chek Softclix Lancets lancets Use as instructed   Blood Glucose Monitoring Suppl (ACCU-CHEK GUIDE) w/Device KIT Use to check blood sugar twice daily   glucose blood (ACCU-CHEK GUIDE) test strip Use as instructed   metFORMIN (GLUCOPHAGE) 1000 MG tablet Take 1 tablet (1,000 mg total) by mouth 2 (two) times daily with a meal.   metoprolol succinate (TOPROL-XL) 50 MG 24 hr tablet Take 1 tablet (50 mg total) by mouth daily. Take with or immediately following a meal.   valsartan-hydrochlorothiazide (DIOVAN-HCT) 320-25 MG tablet Take 1 tablet by mouth daily.   atorvastatin (LIPITOR) 40 MG tablet Take 1 tablet (40 mg total) by mouth daily.   cyclobenzaprine (FLEXERIL) 10 MG tablet Take 1 tablet (10 mg total) by mouth 2 (two) times daily as needed for muscle spasms.   [DISCONTINUED] amLODipine (NORVASC) 5 MG tablet Take 1  tablet (5 mg total) by mouth daily. (Patient not taking: Reported on 08/23/2020)   [DISCONTINUED] aspirin EC 81 MG tablet Take 1 tablet (81 mg total) by mouth daily. (Patient not taking: Reported on 08/23/2020)   [DISCONTINUED] atorvastatin (LIPITOR) 40 MG tablet Take 1 tablet (40 mg total) by mouth daily. (Patient not taking: Reported on 08/23/2020)   [DISCONTINUED] Blood Glucose Monitoring Suppl (TRUE METRIX METER) w/Device KIT Test fasting blood sugars and at bedtime (Patient not taking: Reported on 08/23/2020)   [DISCONTINUED] Blood Glucose Monitoring Suppl (TRUE METRIX METER) w/Device KIT Test fasting blood sugars and at bedtime   [DISCONTINUED] cyclobenzaprine (FLEXERIL) 10 MG tablet Take 1 tablet (10 mg total) by mouth 2 (two) times daily as needed for muscle spasms. (Patient not taking: Reported on 08/23/2020)   [DISCONTINUED] glucose blood (TRUE METRIX BLOOD GLUCOSE TEST) test strip Use as instructed (Patient not taking: Reported on 08/23/2020)   [DISCONTINUED] glucose blood (TRUE METRIX BLOOD GLUCOSE TEST) test strip Use as instructed   [DISCONTINUED] losartan (COZAAR) 100 MG tablet Take 0.5 tablets (50 mg total) by mouth daily. (Patient not taking: Reported on 08/23/2020)   [DISCONTINUED] metFORMIN (GLUCOPHAGE) 500 MG tablet Take 2 tablets (1,000 mg total) by mouth 2 (two) times daily with a meal. (Patient not taking: Reported on 08/23/2020)   [DISCONTINUED] metoprolol tartrate (LOPRESSOR) 25 MG tablet Take 1 tablet (25 mg total) by mouth 2 (two) times daily. (Patient not taking: Reported on 08/23/2020)   [DISCONTINUED] nitroGLYCERIN (NITROSTAT) 0.4 MG SL tablet Place 1 tablet (0.4 mg total) under the tongue every 5 (five) minutes as needed for chest pain. (Patient not taking: Reported on 08/23/2020)   [DISCONTINUED] omeprazole (PRILOSEC) 20 MG capsule Take 1 capsule (20 mg total) by mouth daily. (Patient not taking: Reported on 08/23/2020)   [DISCONTINUED] oxyCODONE-acetaminophen (PERCOCET/ROXICET) 5-325 MG  tablet Take 1 tablet by mouth every 6 (six) hours as needed for severe pain. (Patient not taking: No sig reported)   [DISCONTINUED] traZODone (DESYREL) 50 MG tablet Take 0.5-1 tablets (25-50 mg total) by mouth at bedtime as needed for sleep. (Patient not taking: Reported on 08/23/2020)   [DISCONTINUED] TRUEplus Lancets 28G MISC Use as directed (Patient not taking: Reported on 08/23/2020)   [  DISCONTINUED] TRUEplus Lancets 28G MISC Use as directed   No facility-administered encounter medications on file as of 08/23/2020.    Follow-up: 4-6 weeks face to face  Follow Up Instructions: Meds will be mailed to pt home and will use patient assistance fund for copay for DM test supplies and flexeril   I discussed the assessment and treatment plan with the patient. The patient was provided an opportunity to ask questions and all were answered. The patient agreed with the plan and demonstrated an understanding of the instructions.   The patient was advised to call back or seek an in-person evaluation if the symptoms worsen or if the condition fails to improve as anticipated.  I provided 39 minutes of non-face-to-face time during this encounter  including  median intraservice time , review of notes, labs, imaging, medications  and explaining diagnosis and management to the patient .    Asencion Noble, MD

## 2020-08-23 NOTE — Assessment & Plan Note (Addendum)
Patient smoking one pack daily. 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  Advised smoking cessation. Advised can try nicotine replacement products to assist with quitting tobacco. Follow up at next visit.      . Current smoking consumption amount: 1PPD  . Dicsussion on advise to quit smoking and smoking impacts: cv impacts lung impacts  . Patient's willingness to quit:  Not ready  . Methods to quit smoking discussed:  behav mod/ nicotine replacemetn  . Medication management of smoking session drugs discussed: nicotine patch   . Setting quit date no established  . Follow-up arranged  4-6 weeks    Time spent counseling the patient:  5 min

## 2020-10-11 ENCOUNTER — Ambulatory Visit: Payer: BC Managed Care – PPO | Attending: Critical Care Medicine | Admitting: Critical Care Medicine

## 2020-10-11 ENCOUNTER — Other Ambulatory Visit: Payer: Self-pay

## 2020-10-11 ENCOUNTER — Encounter: Payer: Self-pay | Admitting: Critical Care Medicine

## 2020-10-11 VITALS — BP 113/69 | HR 69 | Ht 75.0 in | Wt 262.8 lb

## 2020-10-11 DIAGNOSIS — Z1211 Encounter for screening for malignant neoplasm of colon: Secondary | ICD-10-CM

## 2020-10-11 DIAGNOSIS — D2310 Other benign neoplasm of skin of unspecified eyelid, including canthus: Secondary | ICD-10-CM | POA: Diagnosis not present

## 2020-10-11 DIAGNOSIS — Z125 Encounter for screening for malignant neoplasm of prostate: Secondary | ICD-10-CM

## 2020-10-11 DIAGNOSIS — Z23 Encounter for immunization: Secondary | ICD-10-CM

## 2020-10-11 DIAGNOSIS — Z720 Tobacco use: Secondary | ICD-10-CM

## 2020-10-11 DIAGNOSIS — F17210 Nicotine dependence, cigarettes, uncomplicated: Secondary | ICD-10-CM

## 2020-10-11 DIAGNOSIS — E1165 Type 2 diabetes mellitus with hyperglycemia: Secondary | ICD-10-CM

## 2020-10-11 DIAGNOSIS — Z1159 Encounter for screening for other viral diseases: Secondary | ICD-10-CM

## 2020-10-11 DIAGNOSIS — I10 Essential (primary) hypertension: Secondary | ICD-10-CM

## 2020-10-11 LAB — POCT GLYCOSYLATED HEMOGLOBIN (HGB A1C): HbA1c, POC (controlled diabetic range): 13.6 % — AB (ref 0.0–7.0)

## 2020-10-11 LAB — GLUCOSE, POCT (MANUAL RESULT ENTRY): POC Glucose: 291 mg/dl — AB (ref 70–99)

## 2020-10-11 MED ORDER — TRULICITY 0.75 MG/0.5ML ~~LOC~~ SOAJ
0.7500 mg | SUBCUTANEOUS | 4 refills | Status: DC
  Filled 2020-10-11 – 2020-10-24 (×2): qty 2, 28d supply, fill #0
  Filled 2020-11-15: qty 2, 28d supply, fill #1

## 2020-10-11 NOTE — Patient Instructions (Signed)
Referral for colonoscopy was made  Referral to have the cyst removed from your left eyelid was made  Please call your podiatrist and have the calluses on the heels addressed do not cut or carve on the calluses yourself and also obtain foot cream to soften the calluses as we discussed  Begin Trulicity 1 injection weekly as per our discussion prescription sent to our pharmacy  Continue to focus on smoking cessation  Continue to follow a healthy diet  Prostate screening labs hepatitis C labs sent to our pharmacy  A tetanus vaccine was given  Return to see Lurena Joiner our clinical pharmacist in 2 weeks and then return to see Dr. Joya Gaskins in 2 months  See attachments  You declined the flu vaccine  We will discussed receiving the pneumococcal vaccine at the next visit for pneumonia vaccination prevention

## 2020-10-11 NOTE — Assessment & Plan Note (Signed)
Continue atorvastatin daily

## 2020-10-11 NOTE — Assessment & Plan Note (Signed)
  .   Current smoking consumption amount: Half pack daily  . Dicsussion on advise to quit smoking and smoking impacts: Cardiovascular impacts  . Patient's willingness to quit: Not ready to yet quit  . Methods to quit smoking discussed: Behavioral modification nicotine replacement  . Medication management of smoking session drugs discussed: Nicotine replacement  . Resources provided:  AVS   . Setting quit date not established  . Follow-up arranged 2 months   Time spent counseling the patient: 5 minutes  

## 2020-10-11 NOTE — Assessment & Plan Note (Signed)
Blood pressure at goal no change in medications 

## 2020-10-11 NOTE — Assessment & Plan Note (Signed)
Type 2 diabetes not yet at goal plan to begin Trulicity [id number] mic milligrams weekly patient instructed as to proper use patient to continue metformin

## 2020-10-11 NOTE — Progress Notes (Signed)
Established Patient Office Visit  Subjective:  Patient ID: Bruce Cabrera, male    DOB: 22-Jun-1965  Age: 55 y.o. MRN: [id number]  CC:  Chief Complaint  Patient presents with   Diabetes    HPI Bruce Cabrera presents for pcp f/u  Had video visit 08/23/20 Patient is seen in return follow-up and still running blood sugars 190-195.  Patient complains of left eye irritation with a dermoid cyst of the left eyelid.  His smoking use has been reduced.  He is requesting a colonoscopy would also like a PSA study The patient also needs thyroid screening.  On arrival blood pressure is good 113/69.  He is still not following a healthy diet.  He does need an eye exam.  He wishes to hold off on a tetanus shot and pneumonia vaccine and he is declining a flu vaccine   Past Medical History:  Diagnosis Date   Diabetes mellitus without complication ([place])    Hyperlipidemia associated with type 2 diabetes mellitus ([place])    based on 2011 profile   Hypertension     Past Surgical History:  Procedure Laterality Date   FOOT SURGERY Left    LEFT HEART CATHETERIZATION WITH CORONARY ANGIOGRAM N/A 11/29/2013   Procedure: LEFT HEART CATHETERIZATION WITH CORONARY ANGIOGRAM;  Surgeon: Troy Sine, MD;  Location: [facility] CATH LAB;  Service: Cardiovascular;  Laterality: N/A;   None      Family History  Problem Relation Age of Onset   Heart attack Mother 51   Heart failure Brother 61   Stroke Sister     Social History   Socioeconomic History   Marital status: Single    Spouse name: Not on file   Number of children: Not on file   Years of education: Not on file   Highest education level: Not on file  Occupational History   Occupation: Counsellor at [employer]  Tobacco Use   Smoking status: Every Day    Packs/day: 0.50    Years: 30.00    Pack years: 15.00    Types: Cigarettes   Smokeless tobacco: Never  Vaping Use   Vaping Use: Never used  Substance and Sexual Activity   Alcohol use: Yes    Comment: 2-  tallboys a day   Drug use: No   Sexual activity: Not on file  Other Topics Concern   Not on file  Social History Narrative   Lives with roommate   Social Determinants of Health   Financial Resource Strain: Not on file  Food Insecurity: Not on file  Transportation Needs: Not on file  Physical Activity: Not on file  Stress: Not on file  Social Connections: Not on file  Intimate Partner Violence: Not on file    Outpatient Medications Prior to Visit  Medication Sig Dispense Refill   Accu-Chek Softclix Lancets lancets Use as instructed 100 each 12   atorvastatin (LIPITOR) 40 MG tablet Take 1 tablet (40 mg total) by mouth daily. 30 tablet 3   Blood Glucose Monitoring Suppl (ACCU-CHEK GUIDE) w/Device KIT Use to check blood sugar twice daily 1 kit 0   cyclobenzaprine (FLEXERIL) 10 MG tablet Take 1 tablet (10 mg total) by mouth 2 (two) times daily as needed for muscle spasms. 40 tablet 0   glucose blood (ACCU-CHEK GUIDE) test strip Use as instructed 100 each 12   metFORMIN (GLUCOPHAGE) 1000 MG tablet Take 1 tablet (1,000 mg total) by mouth 2 (two) times daily with a meal. 180 tablet 3  metoprolol succinate (TOPROL-XL) 50 MG 24 hr tablet Take 1 tablet (50 mg total) by mouth daily. Take with or immediately following a meal. 90 tablet 3   valsartan-hydrochlorothiazide (DIOVAN-HCT) 320-25 MG tablet Take 1 tablet by mouth daily. 90 tablet 3   No facility-administered medications prior to visit.    No Known Allergies  ROS Review of Systems  Constitutional: Negative.   HENT: Negative.    Respiratory: Negative.    Cardiovascular: Negative.   Gastrointestinal: Negative.   Genitourinary: Negative.   Musculoskeletal: Negative.   Psychiatric/Behavioral: Negative.       Objective:    Physical Exam Constitutional:      Appearance: Normal appearance. He is obese.  HENT:     Head: Normocephalic and atraumatic.     Nose: Nose normal.     Mouth/Throat:     Mouth: Mucous membranes are  moist.     Pharynx: Oropharynx is clear.  Eyes:     Extraocular Movements: Extraocular movements intact.     Conjunctiva/sclera: Conjunctivae normal.     Pupils: Pupils are equal, round, and reactive to light.  Cardiovascular:     Rate and Rhythm: Normal rate.     Pulses: Normal pulses.     Heart sounds: Normal heart sounds.  Pulmonary:     Effort: Pulmonary effort is normal.     Breath sounds: Normal breath sounds.  Abdominal:     General: Bowel sounds are normal.     Palpations: Abdomen is soft.  Musculoskeletal:        General: Normal range of motion.     Cervical back: Normal range of motion and neck supple.  Skin:    General: Skin is warm and dry.     Findings: Lesion present.     Comments: Dermoid cyst over left outer upper eyelid  Neurological:     General: No focal deficit present.     Mental Status: He is alert. Mental status is at baseline.  Psychiatric:        Mood and Affect: Mood normal.        Behavior: Behavior normal.        Thought Content: Thought content normal.        Judgment: Judgment normal.    BP 113/69   Pulse 69   Ht 6' 3"  (1.905 m)   Wt 262 lb 12.8 oz (119.2 kg)   SpO2 99%   BMI 32.85 kg/m  Wt Readings from Last 3 Encounters:  10/11/20 262 lb 12.8 oz (119.2 kg)  06/28/20 262 lb 12.8 oz (119.2 kg)  05/16/20 280 lb (127 kg)     Health Maintenance Due  Topic Date Due   FOOT EXAM  Never done   OPHTHALMOLOGY EXAM  Never done   Hepatitis C Screening  Never done   Pneumococcal Vaccine 76-18 Years old (2 - PCV) 11/15/2005   COLON CANCER SCREENING ANNUAL FOBT  11/09/2016   Zoster Vaccines- Shingrix (1 of 2) Never done   INFLUENZA VACCINE  09/18/2020    There are no preventive care reminders to display for this patient.  Lab Results  Component Value Date   TSH 1.070 11/25/2013   Lab Results  Component Value Date   WBC 5.8 05/16/2020   HGB 17.7 (H) 05/16/2020   HCT 53.1 (H) 05/16/2020   MCV 77.2 (L) 05/16/2020   PLT 196 05/16/2020    Lab Results  Component Value Date   NA 140 06/28/2020   K 4.1 06/28/2020   CHLORIDE  104 12/26/2015   CO2 24 06/28/2020   GLUCOSE 159 (H) 06/28/2020   BUN 13 06/28/2020   CREATININE 1.02 06/28/2020   BILITOT 0.4 06/28/2020   ALKPHOS 55 06/28/2020   AST 11 06/28/2020   ALT 10 06/28/2020   PROT 6.1 06/28/2020   ALBUMIN 3.4 (L) 06/28/2020   CALCIUM 9.1 06/28/2020   ANIONGAP 9 05/16/2020   EGFR 88 06/28/2020   Lab Results  Component Value Date   CHOL 242 (H) 07/20/2020   Lab Results  Component Value Date   HDL 51 07/20/2020   Lab Results  Component Value Date   LDLCALC 169 (H) 07/20/2020   Lab Results  Component Value Date   TRIG 124 07/20/2020   Lab Results  Component Value Date   CHOLHDL 4.7 07/20/2020   Lab Results  Component Value Date   HGBA1C 13.6 (A) 10/11/2020      Assessment & Plan:   Problem List Items Addressed This Visit       Cardiovascular and Mediastinum   Essential hypertension    Blood pressure at goal no change in medications      Relevant Orders   Thyroid Panel With TSH     Endocrine   Diabetes mellitus ([place]) - Primary    Type 2 diabetes not yet at goal plan to begin Trulicity 5.95 mic milligrams weekly patient instructed as to proper use patient to continue metformin      Relevant Medications   Dulaglutide (TRULICITY) 6.38 VF/6.4PP SOPN   Other Relevant Orders   POCT glucose (manual entry) (Completed)   POCT glycosylated hemoglobin (Hb A1C) (Completed)     Other   Tobacco use       Current smoking consumption amount: Half pack daily  Dicsussion on advise to quit smoking and smoking impacts: Cardiovascular impacts  Patient's willingness to quit: Not ready to yet quit  Methods to quit smoking discussed: Behavioral modification nicotine replacement  Medication management of smoking session drugs discussed: Nicotine replacement  Resources provided:  AVS   Setting quit date not established  Follow-up arranged 2  months   Time spent counseling the patient: 5 minutes       Other Visit Diagnoses     Colon cancer screening       Relevant Orders   Ambulatory referral to Gastroenterology   Dermoid cyst of eyelid, left       Relevant Orders   Ambulatory referral to Plastic Surgery   Prostate cancer screening       Relevant Orders   PSA   Need for hepatitis C screening test       Relevant Orders   HCV Ab w Reflex to Quant PCR       Meds ordered this encounter  Medications   Dulaglutide (TRULICITY) 2.95 JO/8.4ZY SOPN    Sig: Inject 0.75 mg into the skin once a week.    Dispense:  2 mL    Refill:  4    Follow-up: No follow-ups on file.    Asencion Noble, MD

## 2020-10-12 LAB — HCV AB W REFLEX TO QUANT PCR: HCV Ab: <0.1 s/co ratio (ref 0.0–0.9)

## 2020-10-12 LAB — HCV INTERPRETATION

## 2020-10-12 LAB — THYROID PANEL WITH TSH
Free Thyroxine Index: 1.9 (ref 1.2–4.9)
T3 Uptake Ratio: 27 % (ref 24–39)
T4, Total: 7 ug/dL (ref 4.5–12.0)
TSH: 1.52 u[IU]/mL (ref 0.450–4.500)

## 2020-10-12 LAB — PSA: Prostate Specific Ag, Serum: 1.1 ng/mL (ref 0.0–4.0)

## 2020-10-17 ENCOUNTER — Telehealth: Payer: Self-pay

## 2020-10-17 NOTE — Telephone Encounter (Signed)
Contacted pt to go over lab results pt didn't answer lvm   Sent a CRM and forward labs to NT to give pt labs when they call back   

## 2020-10-18 ENCOUNTER — Other Ambulatory Visit: Payer: Self-pay

## 2020-10-24 ENCOUNTER — Other Ambulatory Visit: Payer: Self-pay

## 2020-10-30 ENCOUNTER — Other Ambulatory Visit: Payer: Self-pay

## 2020-11-01 NOTE — Progress Notes (Signed)
S:    PCP: Dr. Joya Gaskins.   Patient arrives in good spirits. Presents for diabetes evaluation, education, and management. Patient was referred on 06/28/2020 by Levada Dy. Dr. Joya Gaskins is now the patients PCP and was last seen on 10/11/2020. Last pharmacy visit 07/20/2020.   Patient reports Diabetes was diagnosed ~10 years ago. He states he only checks his blood sugar 'once in a blue moon'. He has only administered one Trulicity pen as he did not pick up his prescription until recently. 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 His second dose is due tomorrow. He denies any nausea, vomiting, and diarrhea. He also reports taking all of his blood pressure medications.   Family/Social History:  -FHx: MI, ESRD, stroke -Tobacco: current every day smoker  -Alcohol: denies current use   Insurance coverage/medication affordability: BCBS  Medication adherence reported.    Current diabetes medications include: metformin '1000mg'$  BID, Trulicity (dulaglutide) 0.75 mg (first dose 10/27/2020) Current hypertension medications include: amlodipine 5 mg daily, losartan 100 mg daily metoprolol 25 mg BID Current hyperlipidemia medications include: Atorvastatin 40 mg  Patient denies hypoglycemic events. States he only checks his BG 'once in a blue moon'.   Patient reported dietary habits:  -24h diet recall: Fruit cup, chicken wings, candy  - Drinks mostly water; will have the occasional soda   Patient-reported exercise habits:  - Mainly through work; he works overnight at a tobacco plant   Patient denies nocturia (nighttime urination).  Patient denies neuropathy (nerve pain). Patient reports visual changes. Patient reports self foot exams.     O:  POCT glucose: 294  Lab Results  Component Value Date   HGBA1C 13.6 (A) 10/11/2020   There were no vitals filed for this visit.  Lipid Panel      Component Value Date/Time   CHOL 242 (H) 07/20/2020 0959   TRIG 124 07/20/2020 0959   HDL 51 07/20/2020 0959   CHOLHDL 4.7 07/20/2020 0959   CHOLHDL 7.1 11/25/2013 1639   VLDL 20 11/25/2013 1639   LDLCALC 169 (H) 07/20/2020 0959   Home fasting blood sugars: none as patient rarely checks 2 hour post-meal/random blood sugars: none as patient rarely checks  Clinical Atherosclerotic Cardiovascular Disease (ASCVD): No  The 10-year ASCVD risk score (Arnett DK, et al., 2019) is: 24.2%   Values used to calculate the score:     Age: 65 years     Sex: Male     Is Non-Hispanic African American: Yes     Diabetic: Yes     Tobacco smoker: Yes     Systolic Blood Pressure: 123456 mmHg     Is BP treated: Yes     HDL Cholesterol: 51 mg/dL     Total Cholesterol: 242 mg/dL    A/P: Diabetes longstanding currently uncontrolled based on POC BG and A1c. Medication adherence suboptimal as patient has only taken one dose of Trulicity. No changes until he finished his box of Trulicity [id number] mg pens. -Continued current regimen.  -Discussed the importance of titrating Trulicity in order to achieve full A1c lowering and ASCVD benefit.  -Extensively discussed pathophysiology of diabetes, recommended lifestyle interventions, dietary effects on blood sugar control -Counseled on s/sx of and management of hypoglycemia -Next A1C anticipated 12/2020.   Written patient instructions provided.  Total time in face to face counseling 20 minutes.   Follow  up Pharmacy Clinic Visit in 1 month.    Pauletta Browns, Pharm.D. PGY-1 Pharmacy Resident [id number] 11/02/2020 10:50 AM

## 2020-11-02 ENCOUNTER — Ambulatory Visit: Payer: BC Managed Care – PPO | Attending: Critical Care Medicine | Admitting: Pharmacist

## 2020-11-02 ENCOUNTER — Other Ambulatory Visit: Payer: Self-pay

## 2020-11-02 DIAGNOSIS — E1165 Type 2 diabetes mellitus with hyperglycemia: Secondary | ICD-10-CM

## 2020-11-06 ENCOUNTER — Ambulatory Visit: Payer: BC Managed Care – PPO | Admitting: Podiatry

## 2020-11-08 ENCOUNTER — Other Ambulatory Visit: Payer: Self-pay

## 2020-11-08 ENCOUNTER — Ambulatory Visit (INDEPENDENT_AMBULATORY_CARE_PROVIDER_SITE_OTHER): Payer: BC Managed Care – PPO

## 2020-11-08 ENCOUNTER — Other Ambulatory Visit: Payer: Self-pay | Admitting: Podiatry

## 2020-11-08 ENCOUNTER — Ambulatory Visit (INDEPENDENT_AMBULATORY_CARE_PROVIDER_SITE_OTHER): Payer: BC Managed Care – PPO | Admitting: Podiatry

## 2020-11-08 DIAGNOSIS — M79672 Pain in left foot: Secondary | ICD-10-CM | POA: Diagnosis not present

## 2020-11-08 DIAGNOSIS — E08621 Diabetes mellitus due to underlying condition with foot ulcer: Secondary | ICD-10-CM | POA: Diagnosis not present

## 2020-11-08 DIAGNOSIS — M79671 Pain in right foot: Secondary | ICD-10-CM | POA: Diagnosis not present

## 2020-11-08 DIAGNOSIS — L84 Corns and callosities: Secondary | ICD-10-CM

## 2020-11-08 DIAGNOSIS — L97512 Non-pressure chronic ulcer of other part of right foot with fat layer exposed: Secondary | ICD-10-CM

## 2020-11-08 MED ORDER — GENTAMICIN SULFATE 0.1 % EX CREA
1.0000 "application " | TOPICAL_CREAM | Freq: Three times a day (TID) | CUTANEOUS | 0 refills | Status: DC
  Filled 2020-11-08: qty 15, 5d supply, fill #0

## 2020-11-09 ENCOUNTER — Other Ambulatory Visit: Payer: Self-pay

## 2020-11-10 ENCOUNTER — Other Ambulatory Visit: Payer: Self-pay

## 2020-11-10 NOTE — Progress Notes (Signed)
   Subjective:  55 y.o. male with PMHx of diabetes mellitus presenting today for evaluation of an ulcer to the plantar aspect of the right foot.  He also has some recurrent pain to the lateral aspect of the left foot.  Patient works on his feet all day.  He has been experiencing a dry hard callus under the great toe of the right foot.  He is unable to apply pressure without pain.  He presents for further treatment and evaluation   Past Medical History:  Diagnosis Date   Diabetes mellitus without complication ([place])    Hyperlipidemia associated with type 2 diabetes mellitus ([place])    based on 2011 profile   Hypertension        Objective/Physical Exam General: The patient is alert and oriented x3 in no acute distress.  Dermatology:  Wound #1 noted to the plantar aspect of the first MTP measuring approximately 1.0 x 1.0 x 0.3 cm (LxWxD).  Please see attached photo  To the noted ulceration(s), there is no eschar. There is a moderate amount of slough, fibrin, and necrotic tissue noted. Granulation tissue and wound base is red. There is a minimal amount of serosanguineous drainage noted. There is no exposed bone muscle-tendon ligament or joint. There is no malodor. Periwound integrity is intact. Skin is warm, dry and supple bilateral lower extremities.  Vascular: Palpable pedal pulses bilaterally. No edema or erythema noted. Capillary refill within normal limits.  Neurological: Epicritic and protective threshold diminished bilaterally.   Musculoskeletal Exam: No pedal deformities noted  Radiographic exam RT foot: Normal osseous mineralization.  No fractures identified.  No cortical irregularities or destruction which would be concerning for osteomyelitis. LT foot: History of prior osteotomy to the fifth metatarsal tubercle.  There is some degenerative changes with fragmentation of the proximal portion of the metatarsal tubercle.  On medial oblique view there is possible suspicion for stress  reaction fracture although clinically he does not have any pain associated to this area  Assessment: 1.  Ulcer right plantar first MTP joint secondary to diabetes mellitus 2. diabetes mellitus w/ peripheral neuropathy 2.  Suspicion for possible metaphyseal diaphyseal junction stress reaction fracture fifth metatarsal left   Plan of Care:  1. Patient was evaluated. 2. medically necessary excisional debridement including subcutaneous tissue was performed using a tissue nipper and a chisel blade. Excisional debridement of all the necrotic nonviable tissue down to healthy bleeding viable tissue was performed with post-debridement measurements same as pre-. 3. the wound was cleansed and dry sterile dressing applied. 4.  Currently there does not appear to be any acute infection.  Prescription for gentamicin cream to apply daily  5.  Advise against going barefoot.  Recommend good supportive shoes and sandals with arch support  6.  Patient is to return to clinic in 2 weeks.   Edrick Kins, DPM [facility]  Dr. Edrick Kins, [address]                                        [place], [place] [address]                Office [phone]  Fax [phone]

## 2020-11-15 ENCOUNTER — Other Ambulatory Visit: Payer: Self-pay

## 2020-11-30 ENCOUNTER — Other Ambulatory Visit: Payer: Self-pay

## 2020-11-30 ENCOUNTER — Other Ambulatory Visit: Payer: Self-pay | Admitting: Critical Care Medicine

## 2020-11-30 NOTE — Telephone Encounter (Signed)
Requested medications are due for refill today yes  Requested medications are on the active medication list yes  Last refill 08/23/20  Last visit 10/11/20  Future visit scheduled 12/12/20  Notes to clinic This medication can not be delegated, please assess.   Requested Prescriptions  Pending Prescriptions Disp Refills   cyclobenzaprine (FLEXERIL) 10 MG tablet 40 tablet 0    Sig: Take 1 tablet (10 mg total) by mouth 2 (two) times daily as needed for muscle spasms.     Not Delegated - Analgesics:  Muscle Relaxants Failed - 11/30/2020  9:58 AM      Failed - This refill cannot be delegated      Passed - Valid encounter within last 6 months    Recent Outpatient Visits           4 weeks ago Type 2 diabetes mellitus with hyperglycemia, unspecified whether long term insulin use [facility])   [place], Annie Main L, RPH-CPP   1 month ago Type 2 diabetes mellitus with hyperglycemia, unspecified whether long term insulin use ([place])   [place] Elsie Stain, MD   3 months ago Type 2 diabetes mellitus with hyperglycemia, unspecified whether long term insulin use [facility])   [place] Elsie Stain, MD   4 months ago Essential hypertension   [place], Annie Main L, RPH-CPP   5 months ago Type 2 diabetes mellitus with hyperglycemia, unspecified whether long term insulin use [facility])   [place], Dionne Bucy, [place]       Future Appointments             In 4 days Daisy Blossom, Jarome Matin, [place]   In 1 week Joya Gaskins Burnett Harry, MD [place]

## 2020-12-01 ENCOUNTER — Other Ambulatory Visit: Payer: Self-pay

## 2020-12-01 MED ORDER — CYCLOBENZAPRINE HCL 10 MG PO TABS
10.0000 mg | ORAL_TABLET | Freq: Two times a day (BID) | ORAL | 0 refills | Status: DC | PRN
  Filled 2020-12-01: qty 40, 20d supply, fill #0

## 2020-12-04 ENCOUNTER — Other Ambulatory Visit: Payer: Self-pay

## 2020-12-04 ENCOUNTER — Ambulatory Visit: Payer: BC Managed Care – PPO | Attending: Critical Care Medicine | Admitting: Pharmacist

## 2020-12-04 DIAGNOSIS — E1165 Type 2 diabetes mellitus with hyperglycemia: Secondary | ICD-10-CM | POA: Diagnosis not present

## 2020-12-04 LAB — GLUCOSE, POCT (MANUAL RESULT ENTRY): POC Glucose: 128 mg/dl — AB (ref 70–99)

## 2020-12-04 MED ORDER — TRULICITY 1.5 MG/0.5ML ~~LOC~~ SOAJ
1.5000 mg | SUBCUTANEOUS | 2 refills | Status: DC
  Filled 2020-12-04: qty 2, 28d supply, fill #0

## 2020-12-04 NOTE — Progress Notes (Signed)
    S:    PCP: Dr. Joya Gaskins.   Patient arrives in good spirits. Presents for diabetes evaluation, education, and management. Patient was referred on 06/28/2020 by Levada Dy. Dr. Joya Gaskins is now the patients PCP and was last seen on 10/11/2020. Pt was seen by pharmacy 11/02/2020 and we made no changes.  Today, pt endorses compliance with medications. He has now had ~5-6 injections of Trulicity. Injects on Sundays. Denies any abdominal pain, NV. No changes in vision. He tells me that he is feeling better overall.   Family/Social History:  -FHx: MI, ESRD, stroke -Tobacco: current every day smoker  -Alcohol: denies current use   Insurance coverage/medication affordability: BCBS  Medication adherence reported.    Current diabetes medications include: metformin 1000mg  BID, Trulicity (dulaglutide) 0.75 mg  Current hypertension medications include: valsartan-HCTZ 320-25 mg daily, metoprolol 25 mg BID Current hyperlipidemia medications include: Atorvastatin 40 mg  Patient denies hypoglycemic events.   Patient reported dietary habits:  -24h diet recall: Fruit cup, chicken wings, candy  - Drinks mostly water; will have the occasional soda   Patient-reported exercise habits:  - Mainly through work; he works overnight at a tobacco plant   Patient denies nocturia (nighttime urination).  Patient denies neuropathy (nerve pain). Patient reports visual changes. Patient reports self foot exams.     O:  POCT glucose: 128  Lab Results  Component Value Date   HGBA1C 13.6 (A) 10/11/2020   There were no vitals filed for this visit.  Lipid Panel     Component Value Date/Time   CHOL 242 (H) 07/20/2020 0959   TRIG 124 07/20/2020 0959   HDL 51 07/20/2020 0959   CHOLHDL 4.7 07/20/2020 0959   CHOLHDL 7.1 11/25/2013 1639   VLDL 20 11/25/2013 1639   LDLCALC 169 (H) 07/20/2020 0959   Home fasting blood sugars: pt reports seeing 190s at home. No meter with him today.   Clinical Atherosclerotic  Cardiovascular Disease (ASCVD): No  The 10-year ASCVD risk score (Arnett DK, et al., 2019) is: 24.2%   Values used to calculate the score:     Age: 55 years     Sex: Male     Is Non-Hispanic African American: Yes     Diabetic: Yes     Tobacco smoker: Yes     Systolic Blood Pressure: 628 mmHg     Is BP treated: Yes     HDL Cholesterol: 51 mg/dL     Total Cholesterol: 242 mg/dL    A/P: Diabetes longstanding currently uncontrolled based on A1c. However, seems to be improving based on glucose levels at home and in clinic today. Medication adherence reported. -Increase Trulicity 1.5 mg weekly.   -Continue metformin 1000 mg BID. -Discussed the importance of titrating Trulicity in order to achieve full A1c lowering and ASCVD benefit.  -Extensively discussed pathophysiology of diabetes, recommended lifestyle interventions, dietary effects on blood sugar control -Counseled on s/sx of and management of hypoglycemia -Next A1C anticipated 12/2020.   Written patient instructions provided.  Total time in face to face counseling 20 minutes.   Follow up PCP Clinic Visit next week.  Benard Halsted, PharmD, [place], [place] [phone]

## 2020-12-05 ENCOUNTER — Telehealth: Payer: Self-pay | Admitting: Pharmacist

## 2020-12-05 ENCOUNTER — Other Ambulatory Visit: Payer: Self-pay

## 2020-12-05 NOTE — Telephone Encounter (Signed)
Thanks , noted , will review when I see him

## 2020-12-05 NOTE — Telephone Encounter (Signed)
Dr. Joya Gaskins,   Saw this patient Monday (10/17) for DM management. He works the overnight shift and expresses interest in trazodone to help him sleep. He has an appt scheduled with you next week, but I told him I would let you know.

## 2020-12-06 ENCOUNTER — Other Ambulatory Visit: Payer: Self-pay

## 2020-12-06 ENCOUNTER — Ambulatory Visit (INDEPENDENT_AMBULATORY_CARE_PROVIDER_SITE_OTHER): Payer: BC Managed Care – PPO | Admitting: Podiatry

## 2020-12-06 DIAGNOSIS — M79674 Pain in right toe(s): Secondary | ICD-10-CM

## 2020-12-06 DIAGNOSIS — L97512 Non-pressure chronic ulcer of other part of right foot with fat layer exposed: Secondary | ICD-10-CM | POA: Diagnosis not present

## 2020-12-06 DIAGNOSIS — E08621 Diabetes mellitus due to underlying condition with foot ulcer: Secondary | ICD-10-CM | POA: Diagnosis not present

## 2020-12-06 DIAGNOSIS — M79675 Pain in left toe(s): Secondary | ICD-10-CM | POA: Diagnosis not present

## 2020-12-06 DIAGNOSIS — B351 Tinea unguium: Secondary | ICD-10-CM | POA: Diagnosis not present

## 2020-12-11 NOTE — Progress Notes (Signed)
Established Patient Office Visit  Subjective:  Patient ID: AH BOTT, male    DOB: 10/12/1965  Age: 55 y.o. MRN: [id number]  CC:  Chief Complaint  Patient presents with   Diabetes    HPI Bruce Cabrera presents for primary care follow-up with type 2 diabetes.  Patient is on Trulicity 1.5 mg weekly and 1000 mg twice daily of metformin per clinical pharmacy from in October 17 visit.  Patient does need a foot exam at this visit.  Patient still smoking 3 to 4 cigarettes daily and does complain of some insomnia.  On arrival A1c by point-of-care is 11.5.  Patient does need a colonoscopy. The patient received and agreed to receive a pneumococcal vaccination however declined the flu vaccine  There are no other complaints Past Medical History:  Diagnosis Date   Diabetes mellitus without complication ([place])    Hyperlipidemia associated with type 2 diabetes mellitus ([place])    based on 2011 profile   Hypertension     Past Surgical History:  Procedure Laterality Date   FOOT SURGERY Left    LEFT HEART CATHETERIZATION WITH CORONARY ANGIOGRAM N/A 11/29/2013   Procedure: LEFT HEART CATHETERIZATION WITH CORONARY ANGIOGRAM;  Surgeon: Troy Sine, MD;  Location: [facility] CATH LAB;  Service: Cardiovascular;  Laterality: N/A;   None      Family History  Problem Relation Age of Onset   Heart attack Mother 76   Heart failure Brother 8   Stroke Sister     Social History   Socioeconomic History   Marital status: Single    Spouse name: Not on file   Number of children: Not on file   Years of education: Not on file   Highest education level: Not on file  Occupational History   Occupation: Counsellor at [employer]  Tobacco Use   Smoking status: Every Day    Packs/day: 0.50    Years: 30.00    Pack years: 15.00    Types: Cigarettes   Smokeless tobacco: Never  Vaping Use   Vaping Use: Never used  Substance and Sexual Activity   Alcohol use: Yes    Comment: 2- tallboys a day   Drug use: No    Sexual activity: Not on file  Other Topics Concern   Not on file  Social History Narrative   Lives with roommate   Social Determinants of Health   Financial Resource Strain: Not on file  Food Insecurity: Not on file  Transportation Needs: Not on file  Physical Activity: Not on file  Stress: Not on file  Social Connections: Not on file  Intimate Partner Violence: Not on file    Outpatient Medications Prior to Visit  Medication Sig Dispense Refill   Accu-Chek Softclix Lancets lancets Use as instructed 100 each 12   Blood Glucose Monitoring Suppl (ACCU-CHEK GUIDE) w/Device KIT Use to check blood sugar twice daily 1 kit 0   Dulaglutide (TRULICITY) 1.5 QP/5.9FM SOPN Inject 1.5 mg into the skin once a week. 2 mL 2   glucose blood (ACCU-CHEK GUIDE) test strip Use as instructed 100 each 12   metFORMIN (GLUCOPHAGE) 1000 MG tablet Take 1 tablet (1,000 mg total) by mouth 2 (two) times daily with a meal. 180 tablet 3   metoprolol succinate (TOPROL-XL) 50 MG 24 hr tablet Take 1 tablet (50 mg total) by mouth daily. Take with or immediately following a meal. 90 tablet 3   valsartan-hydrochlorothiazide (DIOVAN-HCT) 320-25 MG tablet Take 1 tablet by mouth daily.  90 tablet 3   atorvastatin (LIPITOR) 40 MG tablet Take 1 tablet (40 mg total) by mouth daily. 30 tablet 3   cyclobenzaprine (FLEXERIL) 10 MG tablet Take 1 tablet (10 mg total) by mouth 2 (two) times daily as needed for muscle spasms. 40 tablet 0   gentamicin cream (GARAMYCIN) 0.1 % Apply 1 application topically 3 (three) times daily. 15 g 0   No facility-administered medications prior to visit.    No Known Allergies  ROS Review of Systems  Constitutional: Negative.   HENT: Negative.  Negative for ear pain, postnasal drip, rhinorrhea, sinus pressure, sore throat, trouble swallowing and voice change.   Eyes: Negative.   Respiratory: Negative.  Negative for apnea, cough, choking, chest tightness, shortness of breath, wheezing and stridor.    Cardiovascular: Negative.  Negative for chest pain, palpitations and leg swelling.  Gastrointestinal: Negative.  Negative for abdominal distention, abdominal pain, nausea and vomiting.  Genitourinary: Negative.   Musculoskeletal: Negative.  Negative for arthralgias and myalgias.  Skin: Negative.  Negative for rash.  Allergic/Immunologic: Negative.  Negative for environmental allergies and food allergies.  Neurological: Negative.  Negative for dizziness, syncope, weakness and headaches.  Hematological: Negative.  Negative for adenopathy. Does not bruise/bleed easily.  Psychiatric/Behavioral: Negative.  Negative for agitation and sleep disturbance. The patient is not nervous/anxious.      Objective:    Physical Exam Vitals reviewed.  Constitutional:      Appearance: Normal appearance. He is well-developed. He is obese. He is not diaphoretic.  HENT:     Head: Normocephalic and atraumatic.     Nose: No nasal deformity, septal deviation, mucosal edema or rhinorrhea.     Right Sinus: No maxillary sinus tenderness or frontal sinus tenderness.     Left Sinus: No maxillary sinus tenderness or frontal sinus tenderness.     Mouth/Throat:     Pharynx: No oropharyngeal exudate.  Eyes:     General: No scleral icterus.    Conjunctiva/sclera: Conjunctivae normal.     Pupils: Pupils are equal, round, and reactive to light.  Neck:     Thyroid: No thyromegaly.     Vascular: No carotid bruit or JVD.     Trachea: Trachea normal. No tracheal tenderness or tracheal deviation.  Cardiovascular:     Rate and Rhythm: Normal rate and regular rhythm.     Chest Wall: PMI is not displaced.     Pulses: Normal pulses. No decreased pulses.     Heart sounds: Normal heart sounds, S1 normal and S2 normal. Heart sounds not distant. No murmur heard. No systolic murmur is present.  No diastolic murmur is present.    No friction rub. No gallop. No S3 or S4 sounds.  Pulmonary:     Effort: No tachypnea, accessory  muscle usage or respiratory distress.     Breath sounds: No stridor. No decreased breath sounds, wheezing, rhonchi or rales.  Chest:     Chest wall: No tenderness.  Abdominal:     General: Bowel sounds are normal. There is no distension.     Palpations: Abdomen is soft. Abdomen is not rigid.     Tenderness: There is no abdominal tenderness. There is no guarding or rebound.  Musculoskeletal:        General: Normal range of motion.     Cervical back: Normal range of motion and neck supple. No edema, erythema or rigidity. No muscular tenderness. Normal range of motion.     Comments: Thick calluses on both feet and  decreased sensation bilaterally  Lymphadenopathy:     Head:     Right side of head: No submental or submandibular adenopathy.     Left side of head: No submental or submandibular adenopathy.     Cervical: No cervical adenopathy.  Skin:    General: Skin is warm and dry.     Coloration: Skin is not pale.     Findings: No rash.     Nails: There is no clubbing.  Neurological:     Mental Status: He is alert and oriented to person, place, and time.     Sensory: No sensory deficit.  Psychiatric:        Speech: Speech normal.        Behavior: Behavior normal.    BP 116/79   Pulse 79   Resp 16   Wt 265 lb 6.4 oz (120.4 kg)   SpO2 98%   BMI 33.17 kg/m  Wt Readings from Last 3 Encounters:  12/12/20 265 lb 6.4 oz (120.4 kg)  10/11/20 262 lb 12.8 oz (119.2 kg)  06/28/20 262 lb 12.8 oz (119.2 kg)     Health Maintenance Due  Topic Date Due   Zoster Vaccines- Shingrix (1 of 2) Never done    There are no preventive care reminders to display for this patient.  Lab Results  Component Value Date   TSH 1.520 10/11/2020   Lab Results  Component Value Date   WBC 5.8 05/16/2020   HGB 17.7 (H) 05/16/2020   HCT 53.1 (H) 05/16/2020   MCV 77.2 (L) 05/16/2020   PLT 196 05/16/2020   Lab Results  Component Value Date   NA 140 06/28/2020   K 4.1 06/28/2020   CHLORIDE 104  12/26/2015   CO2 24 06/28/2020   GLUCOSE 159 (H) 06/28/2020   BUN 13 06/28/2020   CREATININE 1.02 06/28/2020   BILITOT 0.4 06/28/2020   ALKPHOS 55 06/28/2020   AST 11 06/28/2020   ALT 10 06/28/2020   PROT 6.1 06/28/2020   ALBUMIN 3.4 (L) 06/28/2020   CALCIUM 9.1 06/28/2020   ANIONGAP 9 05/16/2020   EGFR 88 06/28/2020   Lab Results  Component Value Date   CHOL 242 (H) 07/20/2020   Lab Results  Component Value Date   HDL 51 07/20/2020   Lab Results  Component Value Date   LDLCALC 169 (H) 07/20/2020   Lab Results  Component Value Date   TRIG 124 07/20/2020   Lab Results  Component Value Date   CHOLHDL 4.7 07/20/2020   Lab Results  Component Value Date   HGBA1C 11.5 (A) 12/12/2020      Assessment & Plan:   Problem List Items Addressed This Visit       Cardiovascular and Mediastinum   Essential hypertension    Hypertension well controlled no change in medications      Relevant Medications   atorvastatin (LIPITOR) 40 MG tablet     Endocrine   Hyperlipidemia due to type 2 diabetes mellitus ([place])    Hyperlipidemia due to type 2 diabetes follow-up lipid panel      Relevant Medications   atorvastatin (LIPITOR) 40 MG tablet   Diabetes mellitus ([place])    Type 2 diabetes not well controlled we will obtain a blood hemoglobin A1c and if it is elevated we will need to add an insulin product  I will also refer the patient back to clinical pharmacy for recheck      Relevant Medications   atorvastatin (LIPITOR) 40 MG tablet  Other Relevant Orders   HgB A1c (Completed)   Hemoglobin A1c   Comprehensive metabolic panel   Lipid panel     Other   OBESITY    I went over with the patient dietary changes      Tobacco use       Current smoking consumption amount: Half pack daily  Dicsussion on advise to quit smoking and smoking impacts: Cardiovascular impacts  Patient's willingness to quit: Not ready to yet quit  Methods to quit smoking discussed:  Behavioral modification nicotine replacement  Medication management of smoking session drugs discussed: Nicotine replacement  Resources provided:  AVS   Setting quit date not established  Follow-up arranged 2 months   Time spent counseling the patient: 5 minutes       Other Visit Diagnoses     Colon cancer screening    -  Primary   Relevant Orders   Ambulatory referral to Gastroenterology   Need for Streptococcus pneumoniae vaccination       Relevant Orders   Pneumococcal conjugate vaccine 20-valent (Prevnar 20) (Completed)       Meds ordered this encounter  Medications   atorvastatin (LIPITOR) 40 MG tablet    Sig: Take 1 tablet (40 mg total) by mouth daily.    Dispense:  30 tablet    Refill:  3   nicotine polacrilex (NICORETTE MINI) 4 MG lozenge    Sig: Take three times a day to quit smoking    Dispense:  100 tablet    Refill:  4   Refer to gastroenterology for colon cancer screening Follow-up: Return in about 2 months (around 02/11/2021).    Asencion Noble, MD

## 2020-12-12 ENCOUNTER — Encounter: Payer: Self-pay | Admitting: Oncology

## 2020-12-12 ENCOUNTER — Encounter: Payer: Self-pay | Admitting: Critical Care Medicine

## 2020-12-12 ENCOUNTER — Other Ambulatory Visit: Payer: Self-pay

## 2020-12-12 ENCOUNTER — Ambulatory Visit: Payer: BC Managed Care – PPO | Attending: Critical Care Medicine | Admitting: Critical Care Medicine

## 2020-12-12 VITALS — BP 116/79 | HR 79 | Resp 16 | Wt 265.4 lb

## 2020-12-12 DIAGNOSIS — E1169 Type 2 diabetes mellitus with other specified complication: Secondary | ICD-10-CM | POA: Diagnosis not present

## 2020-12-12 DIAGNOSIS — I10 Essential (primary) hypertension: Secondary | ICD-10-CM

## 2020-12-12 DIAGNOSIS — E6609 Other obesity due to excess calories: Secondary | ICD-10-CM

## 2020-12-12 DIAGNOSIS — E785 Hyperlipidemia, unspecified: Secondary | ICD-10-CM

## 2020-12-12 DIAGNOSIS — Z6833 Body mass index (BMI) 33.0-33.9, adult: Secondary | ICD-10-CM

## 2020-12-12 DIAGNOSIS — E0829 Diabetes mellitus due to underlying condition with other diabetic kidney complication: Secondary | ICD-10-CM

## 2020-12-12 DIAGNOSIS — Z1211 Encounter for screening for malignant neoplasm of colon: Secondary | ICD-10-CM

## 2020-12-12 DIAGNOSIS — Z23 Encounter for immunization: Secondary | ICD-10-CM | POA: Diagnosis not present

## 2020-12-12 DIAGNOSIS — F17210 Nicotine dependence, cigarettes, uncomplicated: Secondary | ICD-10-CM | POA: Diagnosis not present

## 2020-12-12 DIAGNOSIS — Z720 Tobacco use: Secondary | ICD-10-CM

## 2020-12-12 LAB — POCT GLYCOSYLATED HEMOGLOBIN (HGB A1C): HbA1c, POC (controlled diabetic range): 11.5 % — AB (ref 0.0–7.0)

## 2020-12-12 MED ORDER — NICOTINE POLACRILEX 4 MG MT LOZG
LOZENGE | OROMUCOSAL | 4 refills | Status: DC
  Filled 2020-12-12: qty 72, 24d supply, fill #0

## 2020-12-12 MED ORDER — ATORVASTATIN CALCIUM 40 MG PO TABS
40.0000 mg | ORAL_TABLET | Freq: Every day | ORAL | 3 refills | Status: DC
  Filled 2020-12-12 – 2021-01-04 (×2): qty 30, 30d supply, fill #0
  Filled 2021-01-22: qty 90, 90d supply, fill #0
  Filled 2021-06-06: qty 30, 30d supply, fill #0

## 2020-12-12 NOTE — Patient Instructions (Signed)
You have adequate refills on all medications [place] medicine changes made  Stop by the lab today for hemoglobin K8J metabolic profile and cholesterol check  Pneumonia vaccine was given  Focus on smoking cessation I sent an order for nicotine lozenges you can get those at our pharmacy here take a 4 mg 3 times daily as needed to quit smoking  Referral for colonoscopy was made  Try melatonin 10 mg 1 tablet an hour before retiring to help sleep  Keep your follow-up appointment with plastic surgery for the cyst on the left thigh  We will call you with hemoglobin A1c results from the lab today if it still elevated we will be contacting clinical pharmacy to get with you for follow-up short-term and may need to add additional medications to your diabetic program  Follow healthy diet as discussed see attached  No change in blood pressure medicine your blood pressure is excellent  Return to see Dr. Joya Gaskins in 2 months

## 2020-12-12 NOTE — Assessment & Plan Note (Signed)
Hypertension well-controlled no change in medications 

## 2020-12-12 NOTE — Assessment & Plan Note (Signed)
Type 2 diabetes not well controlled we will obtain a blood hemoglobin A1c and if it is elevated we will need to add an insulin product  I will also refer the patient back to clinical pharmacy for recheck

## 2020-12-12 NOTE — Assessment & Plan Note (Signed)
I went over with the patient dietary changes

## 2020-12-12 NOTE — Assessment & Plan Note (Signed)
  .   Current smoking consumption amount: Half pack daily  . Dicsussion on advise to quit smoking and smoking impacts: Cardiovascular impacts  . Patient's willingness to quit: Not ready to yet quit  . Methods to quit smoking discussed: Behavioral modification nicotine replacement  . Medication management of smoking session drugs discussed: Nicotine replacement  . Resources provided:  AVS   . Setting quit date not established  . Follow-up arranged 2 months   Time spent counseling the patient: 5 minutes  

## 2020-12-12 NOTE — Assessment & Plan Note (Signed)
Hyperlipidemia due to type 2 diabetes follow-up lipid panel

## 2020-12-13 LAB — HEMOGLOBIN A1C
Est. average glucose Bld gHb Est-mCnc: 292 mg/dL
Hgb A1c MFr Bld: 11.8 % — ABNORMAL HIGH (ref 4.8–5.6)

## 2020-12-13 LAB — COMPREHENSIVE METABOLIC PANEL
ALT: 13 IU/L (ref 0–44)
AST: 12 IU/L (ref 0–40)
Albumin/Globulin Ratio: 1.4 (ref 1.2–2.2)
Albumin: 4.2 g/dL (ref 3.8–4.9)
Alkaline Phosphatase: 48 IU/L (ref 44–121)
BUN/Creatinine Ratio: 14 (ref 9–20)
BUN: 21 mg/dL (ref 6–24)
Bilirubin Total: 0.3 mg/dL (ref 0.0–1.2)
CO2: 23 mmol/L (ref 20–29)
Calcium: 9.5 mg/dL (ref 8.7–10.2)
Chloride: 101 mmol/L (ref 96–106)
Creatinine, Ser: 1.53 mg/dL — ABNORMAL HIGH (ref 0.76–1.27)
Globulin, Total: 3.1 g/dL (ref 1.5–4.5)
Glucose: 161 mg/dL — ABNORMAL HIGH (ref 70–99)
Potassium: 4.3 mmol/L (ref 3.5–5.2)
Sodium: 139 mmol/L (ref 134–144)
Total Protein: 7.3 g/dL (ref 6.0–8.5)
eGFR: 54 mL/min/{1.73_m2} — ABNORMAL LOW (ref >59)

## 2020-12-13 LAB — LIPID PANEL
Chol/HDL Ratio: 4.3 ratio (ref 0.0–5.0)
Cholesterol, Total: 132 mg/dL (ref 100–199)
HDL: 31 mg/dL — ABNORMAL LOW (ref >39)
LDL Chol Calc (NIH): 71 mg/dL (ref 0–99)
Triglycerides: 177 mg/dL — ABNORMAL HIGH (ref 0–149)
VLDL Cholesterol Cal: 30 mg/dL (ref 5–40)

## 2020-12-13 NOTE — Progress Notes (Signed)
   Subjective:  55 y.o. male with PMHx of diabetes mellitus presenting today for evaluation of an ulcer to the plantar aspect of the right foot.  He also has some recurrent pain to the lateral aspect of the left foot.  Patient works on his feet all day.  He has been experiencing a dry hard callus under the great toe of the right foot.  He is unable to apply pressure without pain.  He presents for further treatment and evaluation   Past Medical History:  Diagnosis Date   Diabetes mellitus without complication ([place])    Hyperlipidemia associated with type 2 diabetes mellitus ([place])    based on 2011 profile   Hypertension      Objective/Physical Exam General: The patient is alert and oriented x3 in no acute distress.  Dermatology:  Wound #1 noted to the plantar aspect of the first MTP has healed.  Complete reepithelialization has occurred Hyperkeratotic dystrophic nails noted 1-5 bilateral with associated tenderness to palpation and shoe gear Skin is warm, dry and supple bilateral lower extremities.  Vascular: Palpable pedal pulses bilaterally. No edema or erythema noted. Capillary refill within normal limits.  Neurological: Epicritic and protective threshold diminished bilaterally.   Musculoskeletal Exam: No pedal deformities noted  Radiographic exam RT foot 11/08/2020: Normal osseous mineralization.  No fractures identified.  No cortical irregularities or destruction which would be concerning for osteomyelitis. LT foot 11/08/2020: History of prior osteotomy to the fifth metatarsal tubercle.  There is some degenerative changes with fragmentation of the proximal portion of the metatarsal tubercle.  On medial oblique view there is possible suspicion for stress reaction fracture although clinically he does not have any pain associated to this area  Assessment: 1.  Ulcer right plantar first MTP joint secondary to diabetes mellitus; healed 2. diabetes mellitus w/ peripheral neuropathy 2.  Pain  due to onychomycosis of toenails both   Plan of Care:  1. Patient was evaluated. 2.  Light debridement of the freshly healed wound was performed to remove any hyperkeratotic tissue around the area.  The patient may resume full activity no restrictions with good supportive shoes 3.  Mechanical debridement of nails 1-5 bilateral was performed using a nail nipper without incident or bleeding 4.  Recommend daily AmLactin foot lotion 5.  Return to clinic as needed  Edrick Kins, DPM [facility]  Dr. Edrick Kins, [address]                                        [place], [place] [address]                Office [phone]  Fax [phone]

## 2020-12-15 ENCOUNTER — Other Ambulatory Visit: Payer: Self-pay | Admitting: Critical Care Medicine

## 2020-12-15 ENCOUNTER — Telehealth: Payer: Self-pay | Admitting: Critical Care Medicine

## 2020-12-15 ENCOUNTER — Other Ambulatory Visit: Payer: Self-pay

## 2020-12-15 MED ORDER — INSULIN GLARGINE (1 UNIT DIAL) 300 UNIT/ML ~~LOC~~ SOPN
10.0000 [IU] | PEN_INJECTOR | Freq: Every day | SUBCUTANEOUS | 4 refills | Status: DC
  Filled 2020-12-15: qty 3, 90d supply, fill #0

## 2020-12-15 NOTE — Telephone Encounter (Signed)
-----   Message from Elsie Stain, MD sent at 12/15/2020  8:59 AM EDT -----  ----- Message ----- From: Elsie Stain, MD Sent: 12/15/2020   5:47 AM EDT To: Leamon Arnt  I have tried to reach the patient , no answer, he may have been working.  His A1C is too high at 11.8      his kidney function is weak due to diabetes.  Liver is normal, cholesterol is at goal with medication,   I am sending Rx for insulin glargine(Toujeo) to our pharmacy and he must see Lurena Joiner in next two weeks clin pharm

## 2020-12-15 NOTE — Telephone Encounter (Signed)
Called Pt and he is aware of results. DOB was confirmed.

## 2020-12-18 ENCOUNTER — Other Ambulatory Visit: Payer: Self-pay

## 2020-12-19 ENCOUNTER — Other Ambulatory Visit: Payer: Self-pay

## 2021-01-05 ENCOUNTER — Other Ambulatory Visit: Payer: Self-pay

## 2021-01-16 ENCOUNTER — Other Ambulatory Visit: Payer: Self-pay

## 2021-01-22 ENCOUNTER — Other Ambulatory Visit: Payer: Self-pay | Admitting: Pharmacist

## 2021-01-22 ENCOUNTER — Other Ambulatory Visit: Payer: Self-pay

## 2021-01-22 MED ORDER — TECHLITE PEN NEEDLES 32G X 4 MM MISC
2 refills | Status: DC
  Filled 2021-01-22: qty 100, 25d supply, fill #0

## 2021-01-23 ENCOUNTER — Other Ambulatory Visit: Payer: Self-pay

## 2021-01-26 ENCOUNTER — Ambulatory Visit (INDEPENDENT_AMBULATORY_CARE_PROVIDER_SITE_OTHER): Payer: BC Managed Care – PPO | Admitting: Plastic Surgery

## 2021-01-26 ENCOUNTER — Other Ambulatory Visit: Payer: Self-pay

## 2021-01-26 ENCOUNTER — Encounter: Payer: Self-pay | Admitting: Plastic Surgery

## 2021-01-26 DIAGNOSIS — R220 Localized swelling, mass and lump, head: Secondary | ICD-10-CM | POA: Diagnosis not present

## 2021-01-26 NOTE — Progress Notes (Signed)
Patient ID: Bruce Cabrera, male    DOB: January 28, 1966, 55 y.o.   MRN: [id number]   Chief Complaint  Patient presents with   Skin Problem    The patient is a 55 year old male here for evaluation of a mass on his left periorbital area.  He says its been there for about a year and seems to be getting larger.  He is worried that is going to affect his vision.  It is soft and sometimes has some twinges but its not painful.  He has a history of diabetes and hypertension.  He is not aware of any trauma to the area.  He has not had anything done.  It is approximately 2 cm, soft and mobile.   Review of Systems  Constitutional: Negative.   HENT: Negative.    Eyes: Negative.   Respiratory: Negative.  Negative for chest tightness.   Cardiovascular: Negative.  Negative for leg swelling.  Gastrointestinal: Negative.   Endocrine: Negative.   Genitourinary: Negative.   Musculoskeletal: Negative.   Skin: Negative.   Hematological: Negative.   Psychiatric/Behavioral: Negative.     Past Medical History:  Diagnosis Date   Diabetes mellitus without complication ([place])    Hyperlipidemia associated with type 2 diabetes mellitus ([place])    based on 2011 profile   Hypertension     Past Surgical History:  Procedure Laterality Date   FOOT SURGERY Left    LEFT HEART CATHETERIZATION WITH CORONARY ANGIOGRAM N/A 11/29/2013   Procedure: LEFT HEART CATHETERIZATION WITH CORONARY ANGIOGRAM;  Surgeon: Troy Sine, MD;  Location: [facility] CATH LAB;  Service: Cardiovascular;  Laterality: N/A;   None        Current Outpatient Medications:    Accu-Chek Softclix Lancets lancets, Use as instructed, Disp: 100 each, Rfl: 12   atorvastatin (LIPITOR) 40 MG tablet, Take 1 tablet (40 mg total) by mouth daily., Disp: 30 tablet, Rfl: 3   Blood Glucose Monitoring Suppl (ACCU-CHEK GUIDE) w/Device KIT, Use to check blood sugar twice daily, Disp: 1 kit, Rfl: 0   Dulaglutide (TRULICITY) 1.5 XN/1.7GY SOPN, Inject 1.5 mg into the  skin once a week., Disp: 2 mL, Rfl: 2   glucose blood (ACCU-CHEK GUIDE) test strip, Use as instructed, Disp: 100 each, Rfl: 12   insulin glargine, 1 Unit Dial, (TOUJEO) 300 UNIT/ML Solostar Pen, Inject 10 Units into the skin at bedtime., Disp: 3 mL, Rfl: 4   Insulin Pen Needle (TECHLITE PEN NEEDLES) 32G X 4 MM MISC, Use as directed with Toujeo., Disp: 100 each, Rfl: 2   metFORMIN (GLUCOPHAGE) 1000 MG tablet, Take 1 tablet (1,000 mg total) by mouth 2 (two) times daily with a meal., Disp: 180 tablet, Rfl: 3   metoprolol succinate (TOPROL-XL) 50 MG 24 hr tablet, Take 1 tablet (50 mg total) by mouth daily. Take with or immediately following a meal., Disp: 90 tablet, Rfl: 3   nicotine polacrilex (NICORETTE MINI) 4 MG lozenge, Take three times a day to quit smoking, Disp: 100 tablet, Rfl: 4   valsartan-hydrochlorothiazide (DIOVAN-HCT) 320-25 MG tablet, Take 1 tablet by mouth daily., Disp: 90 tablet, Rfl: 3   Objective:   Vitals:   01/26/21 1128  BP: 115/73  Pulse: 82  SpO2: 100%    Physical Exam Vitals and nursing note reviewed.  Constitutional:      Appearance: Normal appearance.  HENT:     Head: Normocephalic and atraumatic.  Eyes:   Cardiovascular:     Rate and Rhythm: Normal rate.  Pulses: Normal pulses.  Pulmonary:     Effort: Pulmonary effort is normal.  Musculoskeletal:        General: No swelling or deformity.  Skin:    General: Skin is warm.     Capillary Refill: Capillary refill takes less than 2 seconds.     Coloration: Skin is not jaundiced.     Findings: No bruising.  Neurological:     Mental Status: He is alert and oriented to person, place, and time.  Psychiatric:        Mood and Affect: Mood normal.        Behavior: Behavior normal.        Thought Content: Thought content normal.    Assessment & Plan:  Mass of face  Recommend excision of left periorbital mass.  We discussed that there will be a scar and can do it in the office if he has somebody to drive  him home.  Pictures were obtained of the patient and placed in the chart with the patient's or guardian's permission.   Vivian, DO

## 2021-01-30 ENCOUNTER — Other Ambulatory Visit: Payer: Self-pay

## 2021-02-02 ENCOUNTER — Encounter: Payer: Self-pay | Admitting: Plastic Surgery

## 2021-02-02 ENCOUNTER — Other Ambulatory Visit: Payer: Self-pay

## 2021-02-02 ENCOUNTER — Other Ambulatory Visit (HOSPITAL_COMMUNITY)
Admission: RE | Admit: 2021-02-02 | Discharge: 2021-02-02 | Disposition: A | Discharge status: Home / Self Care | Payer: BC Managed Care – PPO | Source: Ambulatory Visit | Attending: Plastic Surgery | Admitting: Plastic Surgery

## 2021-02-02 ENCOUNTER — Ambulatory Visit (INDEPENDENT_AMBULATORY_CARE_PROVIDER_SITE_OTHER): Payer: BC Managed Care – PPO | Admitting: Plastic Surgery

## 2021-02-02 VITALS — BP 114/70 | HR 72 | Ht 75.0 in | Wt 257.0 lb

## 2021-02-02 DIAGNOSIS — L720 Epidermal cyst: Secondary | ICD-10-CM

## 2021-02-02 DIAGNOSIS — R220 Localized swelling, mass and lump, head: Secondary | ICD-10-CM | POA: Diagnosis not present

## 2021-02-02 MED ORDER — AMOXICILLIN-POT CLAVULANATE 500-125 MG PO TABS
1.0000 | ORAL_TABLET | Freq: Three times a day (TID) | ORAL | 0 refills | Status: AC
  Filled 2021-02-02: qty 9, 3d supply, fill #0

## 2021-02-02 NOTE — Addendum Note (Signed)
Addended by: Wallace Going on: 02/02/2021 10:56 AM   Modules accepted: Orders

## 2021-02-02 NOTE — Progress Notes (Signed)
Procedure Note  Preoperative Dx: left periorbital mass  Postoperative Dx: Same  Procedure: excision of left periorbital sebaceous cyst 2 cm  Anesthesia: Lidocaine 1% with 1:100,000 epinephrine   Description of Procedure: Risks and complications were explained to the patient.  Consent was confirmed and the patient understands the risks and benefits.  The potential complications and alternatives were explained and the patient consents.  The patient expressed understanding the option of not having the procedure and the risks of a scar.  Time out was called and all information was confirmed to be correct.    The area was prepped and drapped.  Lidocaine 1% with epinepherine was injected in the subcutaneous area.  After waiting several minutes for the local to take affect a #15 blade was used to incise the skin over the area.  It was cystic in nature. The skin edges were reapproximated with 6-0 Monocryl subcuticular running closure.  A dressing was applied.  The patient was given instructions on how to care for the area and a follow up appointment.  Marvell tolerated the procedure well and there were no complications. The specimen was sent to pathology.

## 2021-02-02 NOTE — Addendum Note (Signed)
Addended by: Wallace Going on: 02/02/2021 09:14 AM   Modules accepted: Orders

## 2021-02-05 ENCOUNTER — Other Ambulatory Visit: Payer: Self-pay

## 2021-02-05 LAB — SURGICAL PATHOLOGY

## 2021-02-06 ENCOUNTER — Telehealth: Payer: Self-pay | Admitting: *Deleted

## 2021-02-06 NOTE — Telephone Encounter (Signed)
Called and spoke with the patient regarding recent surgical pathology recent.  Informed the patient that all was negative.  Patient verbalized understanding and agreed.//AB/CMA

## 2021-02-06 NOTE — Telephone Encounter (Signed)
-----   Message from Wallace Going, DO sent at 02/05/2021  7:59 PM EST ----- All negative Can you let patient know ----- Message ----- From: Interface, Lab In Three Zero One Sent: 02/05/2021   2:35 PM EST To: Loel Lofty Dillingham, DO

## 2021-02-08 ENCOUNTER — Other Ambulatory Visit: Payer: Self-pay

## 2021-02-09 ENCOUNTER — Other Ambulatory Visit: Payer: Self-pay

## 2021-02-13 ENCOUNTER — Encounter: Payer: Self-pay | Admitting: Plastic Surgery

## 2021-02-13 ENCOUNTER — Other Ambulatory Visit: Payer: Self-pay

## 2021-02-13 ENCOUNTER — Ambulatory Visit (INDEPENDENT_AMBULATORY_CARE_PROVIDER_SITE_OTHER): Payer: BC Managed Care – PPO | Admitting: Plastic Surgery

## 2021-02-13 DIAGNOSIS — R220 Localized swelling, mass and lump, head: Secondary | ICD-10-CM

## 2021-02-13 NOTE — Progress Notes (Signed)
The patient is a 55 year old male here for follow-up after undergoing excision of a periorbital cyst.  It is healing nicely.  He is got a little skin discoloration with some hyperpigmentation.  We knew that that was likely going to take several months to fade.  No sign of infection.  No redness.  A little bit of swelling as expected.  The path did show an epidermal inclusion cyst.  Picture was taken with the patient's permission and he is to follow-up as needed.

## 2021-03-07 ENCOUNTER — Other Ambulatory Visit: Payer: Self-pay

## 2021-06-06 ENCOUNTER — Other Ambulatory Visit: Payer: Self-pay

## 2021-06-21 ENCOUNTER — Telehealth: Payer: Self-pay | Admitting: Critical Care Medicine

## 2021-06-21 NOTE — Telephone Encounter (Signed)
Copied from [place] [id number]. Topic: General - Call Back - No Documentation ?>> Jun 20, 2021  4:23 PM Erick Blinks wrote: ?Reason for CRM: Pt returned call, says he did not receive a VM. Please advise ?

## 2021-06-25 NOTE — Telephone Encounter (Signed)
I didn't see where anyone from clinic as reached out to him.Also while we were on the phone I did schedule him for ov  ?

## 2021-06-30 NOTE — Progress Notes (Incomplete)
? ?  Established Patient Office Visit ? ?Subjective   ?Patient ID: Bruce Cabrera, male    DOB: 01-12-1966  Age: 56 y.o. MRN: [id number] ? ?No chief complaint on file. ? ? ?Essential hypertension  ??  ?? Hypertension well controlled no change in medications ?? ? ?? Relevant Medications ?? atorvastatin (LIPITOR) 40 MG tablet ??  ?? Endocrine ?? Hyperlipidemia due to type 2 diabetes mellitus (HCC) ??  ?? Hyperlipidemia due to type 2 diabetes follow-up lipid panel ?? ? ?? Relevant Medications ?? atorvastatin (LIPITOR) 40 MG tablet ?? Diabetes mellitus ([place]) ??  ?? Type 2 diabetes not well controlled we will obtain a blood hemoglobin A1c and if it is elevated we will need to add an insulin product ?? ?I will also refer the patient back to clinical pharmacy for recheck ?? ? ?? Relevant Medications ?? atorvastatin (LIPITOR) 40 MG tablet ?? Other Relevant Orders ?? HgB A1c (Completed) ?? Hemoglobin A1c ?? Comprehensive metabolic panel ?? Lipid panel ??  ?? Other ?? OBESITY ??  ?? I went over with the patient dietary changes ?? ? ?? Tobacco use ? ? ? ? ?{History (Optional):23778} ? ?ROS ? ?  ?Objective:  ?  ? ?There were no vitals taken for this visit. ?{Vitals History (Optional):23777} ? ?Physical Exam ? ? ?No results found for any visits on 07/02/21. ? ?{Labs (Optional):23779} ? ?The 10-year ASCVD risk score (Arnett DK, et al., 2019) is: 24.9% ? ?  ?Assessment & Plan:  ? ?Problem List Items Addressed This Visit   ?None ? ? ?No follow-ups on file.  ? ? ?Asencion Noble, MD ? ?

## 2021-07-02 ENCOUNTER — Ambulatory Visit (INDEPENDENT_AMBULATORY_CARE_PROVIDER_SITE_OTHER): Payer: BC Managed Care – PPO

## 2021-07-02 ENCOUNTER — Ambulatory Visit (INDEPENDENT_AMBULATORY_CARE_PROVIDER_SITE_OTHER): Payer: BC Managed Care – PPO | Admitting: Podiatry

## 2021-07-02 ENCOUNTER — Ambulatory Visit: Payer: BC Managed Care – PPO | Admitting: Critical Care Medicine

## 2021-07-02 DIAGNOSIS — L97512 Non-pressure chronic ulcer of other part of right foot with fat layer exposed: Secondary | ICD-10-CM

## 2021-07-02 DIAGNOSIS — E0843 Diabetes mellitus due to underlying condition with diabetic autonomic (poly)neuropathy: Secondary | ICD-10-CM | POA: Diagnosis not present

## 2021-07-02 DIAGNOSIS — E08621 Diabetes mellitus due to underlying condition with foot ulcer: Secondary | ICD-10-CM | POA: Diagnosis not present

## 2021-07-02 NOTE — Progress Notes (Addendum)
? ?  Subjective:  ?56 y.o. male with PMHx of diabetes mellitus and h/o recurrent ulcer to the RT foot presenting for symptomatic callus to the plantar aspect of the RT great toe. He works on his feet all day. He has not done anything for treatment recently. He presents for further treatment and evaluation.  ? ?Past Medical History:  ?Diagnosis Date  ? Diabetes mellitus without complication ([place])   ? Hyperlipidemia associated with type 2 diabetes mellitus ([place])   ? based on 2011 profile  ? Hypertension   ? ?Past Surgical History:  ?Procedure Laterality Date  ? FOOT SURGERY Left   ? LEFT HEART CATHETERIZATION WITH CORONARY ANGIOGRAM N/A 11/29/2013  ? Procedure: LEFT HEART CATHETERIZATION WITH CORONARY ANGIOGRAM;  Surgeon: Troy Sine, MD;  Location: [facility] CATH LAB;  Service: Cardiovascular;  Laterality: N/A;  ? None    ? ?No Known Allergies ? ? ? ? ?Objective/Physical Exam ?General: The patient is alert and oriented x3 in no acute distress. ? ?Dermatology:  ?Wound #1 noted to the plantar aspect of the 1st MTP right foot measuring approximately 0.4 x 0.2 x 0.2 cm (LxWxD).  Significant amount of periwound callus noted. ? ?To the noted ulceration(s), there is no eschar. There is a moderate amount of slough, fibrin, and necrotic tissue noted. Granulation tissue and wound base is red. There is a minimal amount of serosanguineous drainage noted. There is no exposed bone muscle-tendon ligament or joint. There is no malodor. Periwound integrity is intact. ?Skin is warm, dry and supple bilateral lower extremities. ? ?Vascular: Palpable pedal pulses bilaterally. No edema or erythema noted. Capillary refill within normal limits. ? ?Neurological: Light touch and protective threshold diminished bilaterally.  ? ?Musculoskeletal Exam: No pedal deformity ? ?Assessment: ?1.  Ulcer plantar aspect of the first MTP RT foot secondary to diabetes mellitus ?2. diabetes mellitus w/ peripheral neuropathy ? ? ?Plan of Care:  ?1. Patient was  evaluated. ?2. medically necessary excisional debridement including subcutaneous tissue was performed using a tissue nipper and a chisel blade. Excisional debridement of all the necrotic nonviable tissue down to healthy bleeding viable tissue was performed with post-debridement measurements same as pre-. ?3. the wound was cleansed and dry sterile dressing applied. ?4.  Patient has gentamicin cream from prior wounds.  Resume with a light dressing daily  ?5.  Appointment with Pedorthist for diabetic shoes and insoles  ?6.  Patient is to return to clinic in 3 weeks. ? ?*Works at [employer] ? ? ?Edrick Kins, DPM ?[facility] ? ?Dr. Edrick Kins, DPM  ?  ?[place]                                        ?[place], [place] [address]                ?Office [phone]  ?Fax [phone] ? ? ? ? ?

## 2021-07-03 ENCOUNTER — Ambulatory Visit: Payer: BC Managed Care – PPO

## 2021-07-03 DIAGNOSIS — E1169 Type 2 diabetes mellitus with other specified complication: Secondary | ICD-10-CM

## 2021-07-03 DIAGNOSIS — L84 Corns and callosities: Secondary | ICD-10-CM

## 2021-07-03 DIAGNOSIS — M7672 Peroneal tendinitis, left leg: Secondary | ICD-10-CM

## 2021-07-03 DIAGNOSIS — L97512 Non-pressure chronic ulcer of other part of right foot with fat layer exposed: Secondary | ICD-10-CM

## 2021-07-03 NOTE — Progress Notes (Signed)
SITUATION ?Patient Name:  Bruce Cabrera ?MRN:   [id number] ?Reason for Visit: Evaluation for foot orthotics ? ?Patient Report: ?Chief Complaint: Patient would like to obtain pre-certification from their insurance regarding coverage and an out of pocket estimate before proceeding. ? ?OBJECTIVE DATA ?Patient History / Diagnosis:   ?  ICD-10-CM   ?1. Hyperlipidemia due to type 2 diabetes mellitus ([place])  E11.69   ? E78.5   ?  ?2. Peroneal tendinitis of left lower extremity  M76.72   ?  ?3. Ulcer of right foot with fat layer exposed ([place])  L97.512   ?  ?4. Pre-ulcerative calluses  L84   ?  ? ? ?Physician Recommended Device(s): Diabetic shoes and insoles, but these items are non-covered. Offered to make one set of custom diabetic style functional foot orthotics to see if insurance coverage is a posibility ? ?Laterality HCPCS Code  ?bilateral L3903  ? ?ACTIONS PERFORMED ?Patient was seen for evaluation for foot orthotics. Financial responsibility was discussed, and patient requested a check of benefits and an estimation of their out of pocket cost for the equipment.  ? ?PLAN ?patient is to be contacted once insurance pre-certification and out of pocket cost estimate is obtained so that they make an informed decision regarding plan of care. In the event patient elects not to pursue orthotic treatment, referring physician is to be contacted in order to update plan of care as needed. All questions were answered and concerns addressed. ? ? ? ? ? ?

## 2021-07-22 NOTE — Progress Notes (Signed)
   Established Patient Office Visit  Subjective   Patient ID: Bruce Cabrera, male    DOB: Jul 06, 1965  Age: 56 y.o. MRN: [id number]  No chief complaint on file.   HPI  {History (Optional):23778}  ROS    Objective:     There were no vitals taken for this visit. {Vitals History (Optional):23777}  Physical Exam   No results found for any visits on 07/23/21.  {Labs (Optional):23779}  The 10-year ASCVD risk score (Arnett DK, et al., 2019) is: 24.9%    Assessment & Plan:   Problem List Items Addressed This Visit   None   No follow-ups on file.    Asencion Noble, MD

## 2021-07-23 ENCOUNTER — Encounter: Payer: Self-pay | Admitting: Oncology

## 2021-07-23 ENCOUNTER — Encounter: Payer: Self-pay | Admitting: Critical Care Medicine

## 2021-07-23 ENCOUNTER — Ambulatory Visit: Payer: BC Managed Care – PPO | Attending: Critical Care Medicine | Admitting: Critical Care Medicine

## 2021-07-23 ENCOUNTER — Ambulatory Visit (INDEPENDENT_AMBULATORY_CARE_PROVIDER_SITE_OTHER): Payer: BC Managed Care – PPO | Admitting: Podiatry

## 2021-07-23 ENCOUNTER — Other Ambulatory Visit: Payer: Self-pay | Admitting: Pharmacist

## 2021-07-23 ENCOUNTER — Other Ambulatory Visit: Payer: Self-pay

## 2021-07-23 VITALS — BP 130/81 | HR 76 | Wt 275.6 lb

## 2021-07-23 DIAGNOSIS — F17210 Nicotine dependence, cigarettes, uncomplicated: Secondary | ICD-10-CM | POA: Diagnosis not present

## 2021-07-23 DIAGNOSIS — Z720 Tobacco use: Secondary | ICD-10-CM

## 2021-07-23 DIAGNOSIS — L97512 Non-pressure chronic ulcer of other part of right foot with fat layer exposed: Secondary | ICD-10-CM

## 2021-07-23 DIAGNOSIS — E1165 Type 2 diabetes mellitus with hyperglycemia: Secondary | ICD-10-CM | POA: Diagnosis not present

## 2021-07-23 DIAGNOSIS — E785 Hyperlipidemia, unspecified: Secondary | ICD-10-CM

## 2021-07-23 DIAGNOSIS — E1169 Type 2 diabetes mellitus with other specified complication: Secondary | ICD-10-CM | POA: Diagnosis not present

## 2021-07-23 DIAGNOSIS — I10 Essential (primary) hypertension: Secondary | ICD-10-CM | POA: Diagnosis not present

## 2021-07-23 LAB — POCT GLYCOSYLATED HEMOGLOBIN (HGB A1C): HbA1c, POC (controlled diabetic range): 10.6 % — AB (ref 0.0–7.0)

## 2021-07-23 LAB — GLUCOSE, POCT (MANUAL RESULT ENTRY): POC Glucose: 270 mg/dl — AB (ref 70–99)

## 2021-07-23 MED ORDER — METFORMIN HCL 1000 MG PO TABS
1000.0000 mg | ORAL_TABLET | Freq: Two times a day (BID) | ORAL | 3 refills | Status: DC
  Filled 2021-07-23 – 2021-08-24 (×2): qty 180, 90d supply, fill #0

## 2021-07-23 MED ORDER — TRULICITY 3 MG/0.5ML ~~LOC~~ SOAJ
3.0000 mg | SUBCUTANEOUS | 4 refills | Status: DC
  Filled 2021-07-23 – 2021-07-24 (×2): qty 2, 28d supply, fill #0
  Filled 2021-08-24: qty 2, 28d supply, fill #1

## 2021-07-23 MED ORDER — ACCU-CHEK GUIDE VI STRP
ORAL_STRIP | 12 refills | Status: DC
Start: 2021-07-23 — End: 2023-04-09
  Filled 2021-07-23: qty 100, 25d supply, fill #0

## 2021-07-23 MED ORDER — ATORVASTATIN CALCIUM 40 MG PO TABS
40.0000 mg | ORAL_TABLET | Freq: Every day | ORAL | 3 refills | Status: DC
  Filled 2021-07-23: qty 30, 30d supply, fill #0
  Filled 2021-08-24: qty 30, 30d supply, fill #1
  Filled 2021-10-02: qty 30, 30d supply, fill #2

## 2021-07-23 MED ORDER — TRUEPLUS 5-BEVEL PEN NEEDLES 32G X 4 MM MISC
2 refills | Status: DC
  Filled 2021-07-23 – 2021-07-26 (×2): qty 100, 90d supply, fill #0
  Filled 2021-10-11: qty 100, 33d supply, fill #0

## 2021-07-23 MED ORDER — AZELASTINE HCL 0.1 % NA SOLN
2.0000 | Freq: Two times a day (BID) | NASAL | 12 refills | Status: DC
Start: 2021-07-23 — End: 2022-02-27
  Filled 2021-07-23: qty 30, 25d supply, fill #0

## 2021-07-23 MED ORDER — INSULIN GLARGINE (1 UNIT DIAL) 300 UNIT/ML ~~LOC~~ SOPN
15.0000 [IU] | PEN_INJECTOR | Freq: Every day | SUBCUTANEOUS | 4 refills | Status: DC
  Filled 2021-07-23: qty 3, 56d supply, fill #0

## 2021-07-23 MED ORDER — METOPROLOL SUCCINATE ER 50 MG PO TB24
50.0000 mg | ORAL_TABLET | Freq: Every day | ORAL | 3 refills | Status: DC
  Filled 2021-07-23 – 2021-08-24 (×2): qty 90, 90d supply, fill #0
  Filled 2021-12-12: qty 90, 90d supply, fill #1

## 2021-07-23 MED ORDER — VALSARTAN-HYDROCHLOROTHIAZIDE 320-25 MG PO TABS
1.0000 | ORAL_TABLET | Freq: Every day | ORAL | 3 refills | Status: DC
  Filled 2021-07-23 – 2021-08-24 (×2): qty 90, 90d supply, fill #0

## 2021-07-23 NOTE — Patient Instructions (Signed)
Increase Toujeo to 15 units daily, a new box of insulin pen needles will be sent that are shorter  Bruce Cabrera our clinical pharmacist to meet you today and we will partner with you bring in your insulin pen and needles and he will help teach you how to inject it painlessly  Stay on Trulicity increased dose to 3 mg to take weekly  Refills on metformin sent to pharmacy  Refills on your testing supplies sent to pharmacy  Refills on all the medications sent to pharmacy downstairs  Return to see Dr. Joya Gaskins 6 weeks after having seen [facility] in [place] handout was given focus on the diet component  Keep your follow-up visits with podiatry regarding your diabetic foot ulcer

## 2021-07-23 NOTE — Assessment & Plan Note (Signed)
Not well controlled  Patient met with the clinical pharmacist and we did arrive to the following plan.  Patient will resume Trulicity 3 mg weekly  Patient will increase Toujeo to 25 units daily and he will come to see the clinical pharmacist to be instructed as to proper use of the insulin pen and we will obtain very small needles for this patient  Patient will also resume metformin

## 2021-07-23 NOTE — Progress Notes (Signed)
   Subjective:  56 y.o. male with PMHx of diabetes mellitus and h/o recurrent ulcer to the RT foot.  Patient states that he is doing well.  He has been applying the antibiotic gentamicin cream.  He states that he did have an appointment with the Pedorthist and it is pending diabetic shoes and insoles insurance authorization.  He presents for further treatment and evaluation  Past Medical History:  Diagnosis Date   Diabetes mellitus without complication ([place])    Hyperlipidemia associated with type 2 diabetes mellitus ([place])    based on 2011 profile   Hypertension    Past Surgical History:  Procedure Laterality Date   FOOT SURGERY Left    LEFT HEART CATHETERIZATION WITH CORONARY ANGIOGRAM N/A 11/29/2013   Procedure: LEFT HEART CATHETERIZATION WITH CORONARY ANGIOGRAM;  Surgeon: Troy Sine, MD;  Location: [facility] CATH LAB;  Service: Cardiovascular;  Laterality: N/A;   None     No Known Allergies   Objective/Physical Exam General: The patient is alert and oriented x3 in no acute distress.  Dermatology:  Wound #1 noted to the plantar aspect of the 1st MTP right foot has healed.  Complete reepithelialization has occurred.  There is no open wound today.  There is a significant amount of callus tissue noted Skin is warm, dry and supple bilateral lower extremities.  Vascular: Palpable pedal pulses bilaterally. No edema or erythema noted. Capillary refill within normal limits.  Neurological: Light touch and protective threshold diminished bilaterally.   Musculoskeletal Exam: No pedal deformity  Assessment: 1.  Ulcer plantar aspect of the first MTP RT foot secondary to diabetes mellitus; healed 2. diabetes mellitus w/ peripheral neuropathy   Plan of Care:  1. Patient was evaluated. 2.  Excisional debridement of the hyperkeratotic preulcerative callus tissue was performed today.  There was no bleeding.  No open wound. 3.  Diabetic shoes and insoles are pending approval at the moment 4.   Continue wearing good supportive new balance sneakers in the meantime 5.  Return to clinic 3 months  *Works at [employer]   Edrick Kins, DPM [facility]  Dr. Edrick Kins, [address]                                        [place], [place] [address]                Office [phone]  Fax [phone]

## 2021-07-23 NOTE — Assessment & Plan Note (Signed)
Blood pressure currently controlled no changes made

## 2021-07-23 NOTE — Assessment & Plan Note (Signed)
  .   Current smoking consumption amount: Half pack daily  . Dicsussion on advise to quit smoking and smoking impacts: Cardiovascular impacts  . Patient's willingness to quit: Not ready to yet quit  . Methods to quit smoking discussed: Behavioral modification nicotine replacement  . Medication management of smoking session drugs discussed: Nicotine replacement  . Resources provided:  AVS   . Setting quit date not established  . Follow-up arranged 2 months   Time spent counseling the patient: 5 minutes  

## 2021-07-23 NOTE — Assessment & Plan Note (Signed)
Continue statin. 

## 2021-07-24 ENCOUNTER — Other Ambulatory Visit: Payer: Self-pay

## 2021-07-26 ENCOUNTER — Other Ambulatory Visit: Payer: Self-pay

## 2021-08-24 ENCOUNTER — Other Ambulatory Visit: Payer: Self-pay

## 2021-08-27 ENCOUNTER — Other Ambulatory Visit: Payer: Self-pay

## 2021-08-28 ENCOUNTER — Ambulatory Visit: Payer: BC Managed Care – PPO | Attending: Critical Care Medicine | Admitting: Pharmacist

## 2021-08-28 ENCOUNTER — Other Ambulatory Visit: Payer: Self-pay

## 2021-08-28 DIAGNOSIS — E1165 Type 2 diabetes mellitus with hyperglycemia: Secondary | ICD-10-CM

## 2021-08-28 MED ORDER — TRULICITY 0.75 MG/0.5ML ~~LOC~~ SOAJ
0.7500 mg | SUBCUTANEOUS | 1 refills | Status: DC
  Filled 2021-08-28 – 2021-08-29 (×2): qty 2, 28d supply, fill #0

## 2021-08-28 NOTE — Progress Notes (Signed)
S:     No chief complaint on file.  Bruce Cabrera is a 56 y.o. male who presents for diabetes evaluation, education, and management.  PMH is significant for HTN, T2DM, HLD, obesity.  Patient was referred and last seen by Primary Care Provider, Dr. Joya Gaskins, on 07/23/2021.   Today, patient arrives in good spirits and presents without any assistance.   Patient reports Diabetes is longstanding. He has longstanding fear of needles. He saw Dr. Joya Gaskins last month but was not taking any injectables prior due to this phobia. He was started on Trulicity 3 mg weekly. He presents today with CC of abdominal pain, abdominal fullness, bloating, nausea. Denies any vomiting. Cannot eat much d/t decreased appetite.   Family/Social History:  Fhx: MI, CHF, stroke Tobacco: current 0.5 PPD smoker  Alcohol: none reported   Current diabetes medications include: Trulicity 3 mg weekly, Toujeo 15u once daily  Patient reports adherence to taking all medications as prescribed.   Insurance coverage: BCBS   Patient denies hypoglycemic events.  Reported home fasting blood sugars: not checking   Reported 2 hour post-meal/random blood sugars: not checking  Patient denies nocturia (nighttime urination).  Patient reports neuropathy (nerve pain). Patient reports visual changes. Patient reports self foot exams.   Patient reported dietary habits:  -Pt admits to dietary indiscretion  Patient-reported exercise habits:  -None outside of work   O:   ROS  Physical Exam  7 day average blood glucose: no meter   No CGM or home meter today. He does not know how to use his.   Lab Results  Component Value Date   HGBA1C 10.6 (A) 07/23/2021   There were no vitals filed for this visit.  Lipid Panel     Component Value Date/Time   CHOL 132 12/12/2020 1015   TRIG 177 (H) 12/12/2020 1015   HDL 31 (L) 12/12/2020 1015   CHOLHDL 4.3 12/12/2020 1015   CHOLHDL 7.1 11/25/2013 1639   VLDL 20 11/25/2013 1639    LDLCALC 71 12/12/2020 1015    Clinical Atherosclerotic Cardiovascular Disease (ASCVD): No  The 10-year ASCVD risk score (Arnett DK, et al., 2019) is: 30.8%   Values used to calculate the score:     Age: 30 years     Sex: Male     Is Non-Hispanic African American: Yes     Diabetic: Yes     Tobacco smoker: Yes     Systolic Blood Pressure: 546 mmHg     Is BP treated: Yes     HDL Cholesterol: 31 mg/dL     Total Cholesterol: 132 mg/dL   A/P: Diabetes longstanding currently uncontrolled. Patient is able to verbalize appropriate hypoglycemia management plan. Medication adherence is okay but he is experiencing significant GI symptoms with high dose Trulicity.   Advised him to skip next week's dose. Then, we can restart Trulicity at the 2.70 mg dose and titrate up if he can tolerate. He is more compliant with the Toujeo. Denies any missed doses since his OV with Dr. Joya Gaskins. He is very thankful for the smaller needles and seems committed to making improvements in his DM control.   -Decreased dose of Trulicity to 3.50 mg weekly. Advised to skip next week's dose.  -Continued Toujeo 15u daily for now. Cannot adjust his dose until we get some home readings.  -Patient was educated on the use of the Accu Chek blood glucose meter. Reviewed necessary supplies and operation of the meter. Also reviewed goal blood glucose  levels. Patient was able to demonstrate use. All questions and concerns were addressed.  -Extensively discussed pathophysiology of diabetes, recommended lifestyle interventions, dietary effects on blood sugar control.  -Counseled on s/sx of and management of hypoglycemia.  -Next A1c anticipated 10/2021.   Written patient instructions provided. Patient verbalized understanding of treatment plan.  Total time in face to face counseling 30 minutes.    Follow-up:  Pharmacist 4-6 weeks. PCP clinic visit next available for back pain.    Benard Halsted, PharmD, [facility], [place] [phone]

## 2021-08-29 ENCOUNTER — Other Ambulatory Visit (HOSPITAL_COMMUNITY): Payer: Self-pay

## 2021-09-09 NOTE — Progress Notes (Signed)
Established Patient Office Visit  Subjective   Patient ID: Bruce Cabrera, male    DOB: October 21, 1965  Age: 56 y.o. MRN: [id number]  Chief Complaint  Patient presents with   Hypertension   Diabetes    06/2021 This is a 56 year old male with type 2 diabetes last seen October 2022.  On arrival today blood pressure is 130/81 and A1c 10.6 blood sugar 270  The patient has not been using Trulicity or Toujeo because he has fear of needles.  He ran out of metformin has been out of medications for some time.  The patient's not been following a healthy diet as well.  Patient has been taking his medications for blood pressure.  Patient complains of some dryness in the mouth.  He has no other complaints  patient is followed now by podiatry for diabetic foot ulcer on the left foot  7/23  Patient seen in return follow-up and he had difficulty with abdominal pain on Trulicity so this had been stopped.  Patient is taking the Toujeo with smaller needles and tolerating well at 15 units daily.  On arrival blood sugar is quite elevated still.  Blood pressure however is at goal 120/76.    Patient Active Problem List   Diagnosis Date Noted   Tobacco use 08/23/2020   Right hip pain 08/23/2020   Polycythemia 12/26/2015   Diabetes mellitus ([place]) 11/25/2013   Hyperlipidemia due to type 2 diabetes mellitus ([place]) 10/13/2006   Essential hypertension 10/13/2006   OBESITY 10/10/2006   Past Medical History:  Diagnosis Date   Diabetes mellitus without complication ([place])    Hyperlipidemia associated with type 2 diabetes mellitus ([place])    based on 2011 profile   Hypertension    Past Surgical History:  Procedure Laterality Date   FOOT SURGERY Left    LEFT HEART CATHETERIZATION WITH CORONARY ANGIOGRAM N/A 11/29/2013   Procedure: LEFT HEART CATHETERIZATION WITH CORONARY ANGIOGRAM;  Surgeon: Troy Sine, MD;  Location: [facility] CATH LAB;  Service: Cardiovascular;  Laterality: N/A;   None     Social History    Tobacco Use   Smoking status: Every Day    Packs/day: 0.50    Years: 30.00    Total pack years: 15.00    Types: Cigarettes   Smokeless tobacco: Never  Vaping Use   Vaping Use: Never used  Substance Use Topics   Alcohol use: Yes    Comment: 2- tallboys a day   Drug use: No   Social History   Socioeconomic History   Marital status: Single    Spouse name: Not on file   Number of children: Not on file   Years of education: Not on file   Highest education level: Not on file  Occupational History   Occupation: Counsellor at [employer]  Tobacco Use   Smoking status: Every Day    Packs/day: 0.50    Years: 30.00    Total pack years: 15.00    Types: Cigarettes   Smokeless tobacco: Never  Vaping Use   Vaping Use: Never used  Substance and Sexual Activity   Alcohol use: Yes    Comment: 2- tallboys a day   Drug use: No   Sexual activity: Not on file  Other Topics Concern   Not on file  Social History Narrative   Lives with roommate   Social Determinants of Health   Financial Resource Strain: Not on file  Food Insecurity: Not on file  Transportation Needs: Not on file  Physical Activity: Not on file  Stress: Not on file  Social Connections: Not on file  Intimate Partner Violence: Not on file   Family Status  Relation Name Status   Mother  Deceased at age 61   Father  Deceased       no info   Brother  Web designer  (Not Specified)   Sister  (Not Specified)   Family History  Problem Relation Age of Onset   Heart attack Mother 59   Heart failure Brother 108   Stroke Sister       Review of Systems  Constitutional:  Negative for chills, diaphoresis, fever, malaise/fatigue and weight loss.  HENT:  Negative for congestion, hearing loss, nosebleeds, sore throat and tinnitus.        Dry mouth  Eyes:  Negative for blurred vision, photophobia and redness.  Respiratory:  Negative for cough, hemoptysis, sputum production, shortness of breath, wheezing and stridor.    Cardiovascular:  Negative for chest pain, palpitations, orthopnea, claudication, leg swelling and PND.  Gastrointestinal:  Positive for abdominal pain. Negative for blood in stool, constipation, diarrhea, heartburn, nausea and vomiting.  Genitourinary:  Negative for dysuria, flank pain, frequency, hematuria and urgency.  Musculoskeletal:  Negative for back pain, falls, joint pain, myalgias and neck pain.       Foot ulcer  Skin:  Negative for itching and rash.  Neurological:  Negative for dizziness, tingling, tremors, sensory change, speech change, focal weakness, seizures, loss of consciousness, weakness and headaches.  Endo/Heme/Allergies:  Negative for environmental allergies and polydipsia. Does not bruise/bleed easily.  Psychiatric/Behavioral:  Negative for depression, memory loss, substance abuse and suicidal ideas. The patient is not nervous/anxious and does not have insomnia.       Objective:     BP 120/76   Pulse 68   Wt 275 lb 12.8 oz (125.1 kg)   SpO2 97%   BMI 34.47 kg/m  BP Readings from Last 3 Encounters:  09/10/21 120/76  07/23/21 130/81  02/02/21 114/70   Wt Readings from Last 3 Encounters:  09/10/21 275 lb 12.8 oz (125.1 kg)  07/23/21 275 lb 9.6 oz (125 kg)  02/02/21 257 lb (116.6 kg)      Physical Exam Vitals reviewed.  Constitutional:      Appearance: Normal appearance. He is well-developed. He is obese. He is not diaphoretic.  HENT:     Head: Normocephalic and atraumatic.     Nose: No nasal deformity, septal deviation, mucosal edema or rhinorrhea.     Right Sinus: No maxillary sinus tenderness or frontal sinus tenderness.     Left Sinus: No maxillary sinus tenderness or frontal sinus tenderness.     Mouth/Throat:     Pharynx: No oropharyngeal exudate.  Eyes:     General: No scleral icterus.    Conjunctiva/sclera: Conjunctivae normal.     Pupils: Pupils are equal, round, and reactive to light.  Neck:     Thyroid: No thyromegaly.     Vascular: No  carotid bruit or JVD.     Trachea: Trachea normal. No tracheal tenderness or tracheal deviation.  Cardiovascular:     Rate and Rhythm: Normal rate and regular rhythm.     Chest Wall: PMI is not displaced.     Pulses: Normal pulses. No decreased pulses.     Heart sounds: Normal heart sounds, S1 normal and S2 normal. Heart sounds not distant. No murmur heard.    No systolic murmur is present.  No diastolic murmur is present.     No friction rub. No gallop. No S3 or S4 sounds.  Pulmonary:     Effort: No tachypnea, accessory muscle usage or respiratory distress.     Breath sounds: No stridor. No decreased breath sounds, wheezing, rhonchi or rales.  Chest:     Chest wall: No tenderness.  Abdominal:     General: Bowel sounds are normal. There is no distension.     Palpations: Abdomen is soft. Abdomen is not rigid.     Tenderness: There is no abdominal tenderness. There is no guarding or rebound.  Musculoskeletal:        General: Normal range of motion.     Cervical back: Normal range of motion and neck supple. No edema, erythema or rigidity. No muscular tenderness. Normal range of motion.  Lymphadenopathy:     Head:     Right side of head: No submental or submandibular adenopathy.     Left side of head: No submental or submandibular adenopathy.     Cervical: No cervical adenopathy.  Skin:    General: Skin is warm and dry.     Coloration: Skin is not pale.     Findings: No rash.     Nails: There is no clubbing.  Neurological:     Mental Status: He is alert and oriented to person, place, and time.     Sensory: No sensory deficit.  Psychiatric:        Speech: Speech normal.        Behavior: Behavior normal.      Results for orders placed or performed in visit on 09/10/21  POCT glucose (manual entry)  Result Value Ref Range   POC Glucose 245 (A) 70 - 99 mg/dl    Last CBC Lab Results  Component Value Date   WBC 5.8 05/16/2020   HGB 17.7 (H) 05/16/2020   HCT 53.1 (H)  05/16/2020   MCV 77.2 (L) 05/16/2020   MCH 25.7 (L) 05/16/2020   RDW 15.1 05/16/2020   PLT 196 20/94/7096   Last metabolic panel Lab Results  Component Value Date   GLUCOSE 161 (H) 12/12/2020   NA 139 12/12/2020   K 4.3 12/12/2020   CL 101 12/12/2020   CO2 23 12/12/2020   BUN 21 12/12/2020   CREATININE 1.53 (H) 12/12/2020   EGFR 54 (L) 12/12/2020   CALCIUM 9.5 12/12/2020   PROT 7.3 12/12/2020   ALBUMIN 4.2 12/12/2020   LABGLOB 3.1 12/12/2020   AGRATIO 1.4 12/12/2020   BILITOT 0.3 12/12/2020   ALKPHOS 48 12/12/2020   AST 12 12/12/2020   ALT 13 12/12/2020   ANIONGAP 9 05/16/2020   Last lipids Lab Results  Component Value Date   CHOL 132 12/12/2020   HDL 31 (L) 12/12/2020   LDLCALC 71 12/12/2020   TRIG 177 (H) 12/12/2020   CHOLHDL 4.3 12/12/2020   Last hemoglobin A1c Lab Results  Component Value Date   HGBA1C 10.6 (A) 07/23/2021   Last thyroid functions Lab Results  Component Value Date   TSH 1.520 10/11/2020   T4TOTAL 7.0 10/11/2020      The 10-year ASCVD risk score (Arnett DK, et al., 2019) is: 27.1%    Assessment & Plan:   Problem List Items Addressed This Visit       Cardiovascular and Mediastinum   Essential hypertension    Blood pressure at goal no changes made        Endocrine   Hyperlipidemia due to type  2 diabetes mellitus (HCC)    Continue statins      Relevant Medications   insulin glargine, 1 Unit Dial, (TOUJEO) 300 UNIT/ML Solostar Pen   dapagliflozin propanediol (FARXIGA) 10 MG TABS tablet   Diabetes mellitus ([place]) - Primary    We will trial Farxiga 10 mg daily increase Toujeo to 20 units daily discontinue further Trulicity      Relevant Medications   insulin glargine, 1 Unit Dial, (TOUJEO) 300 UNIT/ML Solostar Pen   dapagliflozin propanediol (FARXIGA) 10 MG TABS tablet   Other Relevant Orders   POCT glucose (manual entry) (Completed)     Other   Tobacco use       Current smoking consumption amount: Half pack  daily  Dicsussion on advise to quit smoking and smoking impacts: Cardiovascular impacts  Patient's willingness to quit: Not ready to yet quit  Methods to quit smoking discussed: Behavioral modification nicotine replacement  Medication management of smoking session drugs discussed: Nicotine replacement  Resources provided:  AVS   Setting quit date not established  Follow-up arranged 2 months   Time spent counseling the patient: 5 minutes      35 minutes spent with patient  Return in about 6 weeks (around 10/22/2021) for Dr Joya Gaskins 6 weeks [facility] CPP in three weeks DM f/u .    Asencion Noble, MD

## 2021-09-10 ENCOUNTER — Ambulatory Visit: Payer: BC Managed Care – PPO | Attending: Critical Care Medicine | Admitting: Critical Care Medicine

## 2021-09-10 ENCOUNTER — Other Ambulatory Visit: Payer: Self-pay

## 2021-09-10 ENCOUNTER — Encounter: Payer: Self-pay | Admitting: Critical Care Medicine

## 2021-09-10 VITALS — BP 120/76 | HR 68 | Wt 275.8 lb

## 2021-09-10 DIAGNOSIS — F17210 Nicotine dependence, cigarettes, uncomplicated: Secondary | ICD-10-CM | POA: Diagnosis not present

## 2021-09-10 DIAGNOSIS — E1165 Type 2 diabetes mellitus with hyperglycemia: Secondary | ICD-10-CM | POA: Diagnosis not present

## 2021-09-10 DIAGNOSIS — I10 Essential (primary) hypertension: Secondary | ICD-10-CM | POA: Diagnosis not present

## 2021-09-10 DIAGNOSIS — E1169 Type 2 diabetes mellitus with other specified complication: Secondary | ICD-10-CM

## 2021-09-10 DIAGNOSIS — E785 Hyperlipidemia, unspecified: Secondary | ICD-10-CM

## 2021-09-10 DIAGNOSIS — Z720 Tobacco use: Secondary | ICD-10-CM

## 2021-09-10 LAB — GLUCOSE, POCT (MANUAL RESULT ENTRY): POC Glucose: 245 mg/dl — AB (ref 70–99)

## 2021-09-10 MED ORDER — DAPAGLIFLOZIN PROPANEDIOL 10 MG PO TABS
10.0000 mg | ORAL_TABLET | Freq: Every day | ORAL | 4 refills | Status: DC
  Filled 2021-09-10: qty 30, 30d supply, fill #0
  Filled 2021-10-11: qty 30, 30d supply, fill #1
  Filled 2021-11-19: qty 30, 30d supply, fill #2
  Filled 2021-12-24: qty 30, 30d supply, fill #3
  Filled 2022-01-30: qty 30, 30d supply, fill #4

## 2021-09-10 MED ORDER — INSULIN GLARGINE (1 UNIT DIAL) 300 UNIT/ML ~~LOC~~ SOPN: 20.0000 [IU] | PEN_INJECTOR | Freq: Every day | SUBCUTANEOUS | 4 refills | Status: DC

## 2021-09-10 NOTE — Assessment & Plan Note (Signed)
-  Continue statins ?

## 2021-09-10 NOTE — Assessment & Plan Note (Signed)
  .   Current smoking consumption amount: Half pack daily  . Dicsussion on advise to quit smoking and smoking impacts: Cardiovascular impacts  . Patient's willingness to quit: Not ready to yet quit  . Methods to quit smoking discussed: Behavioral modification nicotine replacement  . Medication management of smoking session drugs discussed: Nicotine replacement  . Resources provided:  AVS   . Setting quit date not established  . Follow-up arranged 2 months   Time spent counseling the patient: 5 minutes

## 2021-09-10 NOTE — Patient Instructions (Signed)
Start Farxiga 1 pill daily  In crease Toujeo to 20 units daily  No other medication changes  Focus on reducing your tobacco intake further as we discussed see attachment  Return to see [place] 3 weeks and Dr. Joya Gaskins in 6 weeks

## 2021-09-10 NOTE — Assessment & Plan Note (Signed)
We will trial Farxiga 10 mg daily increase Toujeo to 20 units daily discontinue further Trulicity

## 2021-09-10 NOTE — Assessment & Plan Note (Signed)
Blood pressure at goal no changes made 

## 2021-09-11 ENCOUNTER — Ambulatory Visit: Payer: BC Managed Care – PPO | Admitting: Critical Care Medicine

## 2021-10-02 ENCOUNTER — Ambulatory Visit: Payer: BC Managed Care – PPO | Attending: Critical Care Medicine | Admitting: Pharmacist

## 2021-10-02 ENCOUNTER — Other Ambulatory Visit (HOSPITAL_COMMUNITY): Payer: Self-pay

## 2021-10-02 ENCOUNTER — Other Ambulatory Visit: Payer: Self-pay

## 2021-10-02 DIAGNOSIS — E1165 Type 2 diabetes mellitus with hyperglycemia: Secondary | ICD-10-CM

## 2021-10-02 MED ORDER — INSULIN GLARGINE (1 UNIT DIAL) 300 UNIT/ML ~~LOC~~ SOPN: 20.0000 [IU] | PEN_INJECTOR | Freq: Every day | SUBCUTANEOUS | 4 refills | Status: DC

## 2021-10-02 MED ORDER — INSULIN GLARGINE (1 UNIT DIAL) 300 UNIT/ML ~~LOC~~ SOPN
20.0000 [IU] | PEN_INJECTOR | Freq: Every day | SUBCUTANEOUS | 4 refills | Status: DC
  Filled 2021-10-02 (×2): qty 3, 45d supply, fill #0

## 2021-10-02 NOTE — Progress Notes (Signed)
    S:     No chief complaint on file.  Bruce Cabrera is a 56 y.o. male who presents for diabetes evaluation, education, and management.  PMH is significant for HTN, T2DM, HLD, obesity.  Patient was referred and last seen by Primary Care Provider, Dr. Joya Gaskins, on 09/10/2021. Trulicity was stopped at that visit. Wilder Glade was started and insulin dose was adjusted.    Today, patient arrives in good spirits and presents without any assistance.   Patient reports Diabetes is longstanding. He has longstanding fear of needles. He saw Dr. Joya Gaskins last month and reported doing well with insulin.  Family/Social History:  Fhx: MI, CHF, stroke Tobacco: Has decreased to 1-2 cigs daily.  Alcohol: none reported   Current diabetes medications include: Farxiga 10 mg daily, metformin 1000 mg BID, Toujeo 20u once daily  Patient reports adherence to taking all medications as prescribed. Admits to an occasional missed medication but misses rarely.   Insurance coverage: BCBS   Patient denies hypoglycemic events.  Reported home fasting blood sugars: not checking   Reported 2 hour post-meal/random blood sugars: not checking  Patient denies nocturia (nighttime urination).  Patient reports neuropathy (nerve pain). Patient denies visual changes. Sees an Eye doctor yearly.  Patient reports self foot exams.   Patient reported dietary habits:  -Pt admits to dietary indiscretion -Continues to struggle with this.   Patient-reported exercise habits:  -No formal regimen outside of work.   O:   ROS  Physical Exam  7 day average blood glucose: no meter   No CGM or home meter today. He does not know how to use his.   Lab Results  Component Value Date   HGBA1C 10.6 (A) 07/23/2021   There were no vitals filed for this visit.  Lipid Panel     Component Value Date/Time   CHOL 132 12/12/2020 1015   TRIG 177 (H) 12/12/2020 1015   HDL 31 (L) 12/12/2020 1015   CHOLHDL 4.3 12/12/2020 1015   CHOLHDL 7.1  11/25/2013 1639   VLDL 20 11/25/2013 1639   LDLCALC 71 12/12/2020 1015    Clinical Atherosclerotic Cardiovascular Disease (ASCVD): No  The 10-year ASCVD risk score (Arnett DK, et al., 2019) is: 27.1%   Values used to calculate the score:     Age: 49 years     Sex: Male     Is Non-Hispanic African American: Yes     Diabetic: Yes     Tobacco smoker: Yes     Systolic Blood Pressure: 409 mmHg     Is BP treated: Yes     HDL Cholesterol: 31 mg/dL     Total Cholesterol: 132 mg/dL   A/P: Diabetes longstanding currently uncontrolled. Home CBG data unknown. Encouraged pt to resume checking CBGs at home. Patient is able to verbalize appropriate hypoglycemia management plan. Medication adherence looks great!  -Continued Toujeo 20u daily for now. Cannot adjust his dose until we get some home readings.  -Continue Farxiga, metformin at current doses.  -Extensively discussed pathophysiology of diabetes, recommended lifestyle interventions, dietary effects on blood sugar control.  -Counseled on s/sx of and management of hypoglycemia.  -Next A1c anticipated 10/2021.  -[id number]  Written patient instructions provided. Patient verbalized understanding of treatment plan.  Total time in face to face counseling 30 minutes.    Follow-up:  Pharmacist visit in 1 month.  Benard Halsted, PharmD, [facility], [place] [phone]

## 2021-10-11 ENCOUNTER — Other Ambulatory Visit: Payer: Self-pay

## 2021-10-15 ENCOUNTER — Other Ambulatory Visit: Payer: Self-pay

## 2021-10-15 ENCOUNTER — Emergency Department (HOSPITAL_COMMUNITY): Payer: BC Managed Care – PPO

## 2021-10-15 ENCOUNTER — Encounter (HOSPITAL_COMMUNITY): Payer: Self-pay

## 2021-10-15 ENCOUNTER — Inpatient Hospital Stay (HOSPITAL_COMMUNITY)
Admission: EM | Admit: 2021-10-15 | Discharge: 2021-10-17 | DRG: 638 | Disposition: A | Discharge status: Home Health | Payer: BC Managed Care – PPO | Attending: Family Medicine | Admitting: Family Medicine

## 2021-10-15 ENCOUNTER — Ambulatory Visit (INDEPENDENT_AMBULATORY_CARE_PROVIDER_SITE_OTHER): Payer: BC Managed Care – PPO | Admitting: Podiatry

## 2021-10-15 ENCOUNTER — Inpatient Hospital Stay (HOSPITAL_COMMUNITY): Payer: BC Managed Care – PPO

## 2021-10-15 DIAGNOSIS — E11628 Type 2 diabetes mellitus with other skin complications: Principal | ICD-10-CM | POA: Diagnosis present

## 2021-10-15 DIAGNOSIS — Z720 Tobacco use: Secondary | ICD-10-CM | POA: Diagnosis not present

## 2021-10-15 DIAGNOSIS — F17210 Nicotine dependence, cigarettes, uncomplicated: Secondary | ICD-10-CM | POA: Diagnosis present

## 2021-10-15 DIAGNOSIS — Z794 Long term (current) use of insulin: Secondary | ICD-10-CM

## 2021-10-15 DIAGNOSIS — E785 Hyperlipidemia, unspecified: Secondary | ICD-10-CM | POA: Diagnosis present

## 2021-10-15 DIAGNOSIS — E1122 Type 2 diabetes mellitus with diabetic chronic kidney disease: Secondary | ICD-10-CM | POA: Diagnosis not present

## 2021-10-15 DIAGNOSIS — E1165 Type 2 diabetes mellitus with hyperglycemia: Secondary | ICD-10-CM | POA: Diagnosis present

## 2021-10-15 DIAGNOSIS — L0390 Cellulitis, unspecified: Secondary | ICD-10-CM | POA: Diagnosis not present

## 2021-10-15 DIAGNOSIS — Z79899 Other long term (current) drug therapy: Secondary | ICD-10-CM

## 2021-10-15 DIAGNOSIS — I129 Hypertensive chronic kidney disease with stage 1 through stage 4 chronic kidney disease, or unspecified chronic kidney disease: Secondary | ICD-10-CM | POA: Diagnosis present

## 2021-10-15 DIAGNOSIS — L97511 Non-pressure chronic ulcer of other part of right foot limited to breakdown of skin: Secondary | ICD-10-CM | POA: Diagnosis not present

## 2021-10-15 DIAGNOSIS — R600 Localized edema: Secondary | ICD-10-CM | POA: Diagnosis not present

## 2021-10-15 DIAGNOSIS — E11621 Type 2 diabetes mellitus with foot ulcer: Secondary | ICD-10-CM

## 2021-10-15 DIAGNOSIS — I70229 Atherosclerosis of native arteries of extremities with rest pain, unspecified extremity: Secondary | ICD-10-CM | POA: Diagnosis not present

## 2021-10-15 DIAGNOSIS — E1142 Type 2 diabetes mellitus with diabetic polyneuropathy: Secondary | ICD-10-CM | POA: Diagnosis not present

## 2021-10-15 DIAGNOSIS — S91301A Unspecified open wound, right foot, initial encounter: Secondary | ICD-10-CM | POA: Diagnosis not present

## 2021-10-15 DIAGNOSIS — I10 Essential (primary) hypertension: Secondary | ICD-10-CM | POA: Diagnosis not present

## 2021-10-15 DIAGNOSIS — E872 Acidosis, unspecified: Secondary | ICD-10-CM | POA: Diagnosis present

## 2021-10-15 DIAGNOSIS — M25474 Effusion, right foot: Secondary | ICD-10-CM | POA: Diagnosis not present

## 2021-10-15 DIAGNOSIS — Z8249 Family history of ischemic heart disease and other diseases of the circulatory system: Secondary | ICD-10-CM

## 2021-10-15 DIAGNOSIS — L03115 Cellulitis of right lower limb: Secondary | ICD-10-CM | POA: Diagnosis not present

## 2021-10-15 DIAGNOSIS — L97509 Non-pressure chronic ulcer of other part of unspecified foot with unspecified severity: Secondary | ICD-10-CM | POA: Diagnosis not present

## 2021-10-15 DIAGNOSIS — I120 Hypertensive chronic kidney disease with stage 5 chronic kidney disease or end stage renal disease: Secondary | ICD-10-CM | POA: Diagnosis not present

## 2021-10-15 DIAGNOSIS — L97519 Non-pressure chronic ulcer of other part of right foot with unspecified severity: Secondary | ICD-10-CM | POA: Diagnosis not present

## 2021-10-15 DIAGNOSIS — Z888 Allergy status to other drugs, medicaments and biological substances status: Secondary | ICD-10-CM

## 2021-10-15 DIAGNOSIS — E0843 Diabetes mellitus due to underlying condition with diabetic autonomic (poly)neuropathy: Secondary | ICD-10-CM | POA: Diagnosis not present

## 2021-10-15 DIAGNOSIS — N179 Acute kidney failure, unspecified: Secondary | ICD-10-CM | POA: Diagnosis not present

## 2021-10-15 DIAGNOSIS — E08621 Diabetes mellitus due to underlying condition with foot ulcer: Secondary | ICD-10-CM | POA: Diagnosis not present

## 2021-10-15 DIAGNOSIS — Z7984 Long term (current) use of oral hypoglycemic drugs: Secondary | ICD-10-CM

## 2021-10-15 DIAGNOSIS — L97401 Non-pressure chronic ulcer of unspecified heel and midfoot limited to breakdown of skin: Secondary | ICD-10-CM | POA: Diagnosis not present

## 2021-10-15 DIAGNOSIS — B351 Tinea unguium: Secondary | ICD-10-CM | POA: Diagnosis not present

## 2021-10-15 DIAGNOSIS — L97512 Non-pressure chronic ulcer of other part of right foot with fat layer exposed: Secondary | ICD-10-CM | POA: Diagnosis not present

## 2021-10-15 DIAGNOSIS — N1832 Chronic kidney disease, stage 3b: Secondary | ICD-10-CM | POA: Diagnosis not present

## 2021-10-15 DIAGNOSIS — E1169 Type 2 diabetes mellitus with other specified complication: Secondary | ICD-10-CM | POA: Diagnosis present

## 2021-10-15 DIAGNOSIS — E119 Type 2 diabetes mellitus without complications: Secondary | ICD-10-CM

## 2021-10-15 DIAGNOSIS — E0800 Diabetes mellitus due to underlying condition with hyperosmolarity without nonketotic hyperglycemic-hyperosmolar coma (NKHHC): Secondary | ICD-10-CM | POA: Diagnosis not present

## 2021-10-15 HISTORY — DX: Type 2 diabetes mellitus with foot ulcer: E11.621

## 2021-10-15 LAB — CBC WITH DIFFERENTIAL/PLATELET
Abs Immature Granulocytes: 0 10*3/uL (ref 0.00–0.07)
Basophils Absolute: 0 10*3/uL (ref 0.0–0.1)
Basophils Relative: 0 %
Eosinophils Absolute: 0.3 10*3/uL (ref 0.0–0.5)
Eosinophils Relative: 3 %
HCT: 39.7 % (ref 39.0–52.0)
Hemoglobin: 13 g/dL (ref 13.0–17.0)
Lymphocytes Relative: 28 %
Lymphs Abs: 2.8 10*3/uL (ref 0.7–4.0)
MCH: 25 pg — ABNORMAL LOW (ref 26.0–34.0)
MCHC: 32.7 g/dL (ref 30.0–36.0)
MCV: 76.5 fL — ABNORMAL LOW (ref 80.0–100.0)
Monocytes Absolute: 0.8 10*3/uL (ref 0.1–1.0)
Monocytes Relative: 8 %
Neutro Abs: 6.2 10*3/uL (ref 1.7–7.7)
Neutrophils Relative %: 61 %
Platelets: 280 10*3/uL (ref 150–400)
RBC: 5.19 MIL/uL (ref 4.22–5.81)
RDW: 14.2 % (ref 11.5–15.5)
WBC: 10.1 10*3/uL (ref 4.0–10.5)
nRBC: 0 % (ref 0.0–0.2)

## 2021-10-15 LAB — COMPREHENSIVE METABOLIC PANEL
ALT: 19 U/L (ref 0–44)
AST: 17 U/L (ref 15–41)
Albumin: 3.4 g/dL — ABNORMAL LOW (ref 3.5–5.0)
Alkaline Phosphatase: 50 U/L (ref 38–126)
Anion gap: 14 (ref 5–15)
BUN: 20 mg/dL (ref 6–20)
CO2: 23 mmol/L (ref 22–32)
Calcium: 8.9 mg/dL (ref 8.9–10.3)
Chloride: 98 mmol/L (ref 98–111)
Creatinine, Ser: 1.82 mg/dL — ABNORMAL HIGH (ref 0.61–1.24)
GFR, Estimated: 44 mL/min — ABNORMAL LOW (ref >60)
Glucose, Bld: 243 mg/dL — ABNORMAL HIGH (ref 70–99)
Potassium: 3.4 mmol/L — ABNORMAL LOW (ref 3.5–5.1)
Sodium: 135 mmol/L (ref 135–145)
Total Bilirubin: 0.5 mg/dL (ref 0.3–1.2)
Total Protein: 9.1 g/dL — ABNORMAL HIGH (ref 6.5–8.1)

## 2021-10-15 LAB — LACTIC ACID, PLASMA
Lactic Acid, Venous: 2 mmol/L (ref 0.5–1.9)
Lactic Acid, Venous: 1.8 mmol/L (ref 0.5–1.9)

## 2021-10-15 LAB — PROTIME-INR
INR: 1 (ref 0.8–1.2)
Prothrombin Time: 13 seconds (ref 11.4–15.2)

## 2021-10-15 MED ORDER — SODIUM CHLORIDE 0.9 % IV SOLN
2.0000 g | Freq: Three times a day (TID) | INTRAVENOUS | Status: DC
  Administered 2021-10-16 – 2021-10-17 (×4): 2 g via INTRAVENOUS
  Filled 2021-10-15 (×4): qty 12.5

## 2021-10-15 MED ORDER — INSULIN GLARGINE-YFGN 100 UNIT/ML ~~LOC~~ SOLN
20.0000 [IU] | Freq: Every day | SUBCUTANEOUS | Status: DC
  Administered 2021-10-16 (×2): 20 [IU] via SUBCUTANEOUS
  Filled 2021-10-15 (×3): qty 0.2

## 2021-10-15 MED ORDER — HYDRALAZINE HCL 20 MG/ML IJ SOLN
10.0000 mg | INTRAMUSCULAR | Status: DC | PRN
Start: 2021-10-15 — End: 2021-10-17

## 2021-10-15 MED ORDER — VANCOMYCIN HCL 1500 MG/300ML IV SOLN: 1500.0000 mg | INTRAVENOUS | Status: DC

## 2021-10-15 MED ORDER — VANCOMYCIN HCL 10 G IV SOLR
2500.0000 mg | Freq: Once | INTRAVENOUS | Status: AC
Start: 2021-10-15 — End: 2021-10-16
  Administered 2021-10-16: 2500 mg via INTRAVENOUS
  Filled 2021-10-15: qty 2500

## 2021-10-15 MED ORDER — HEPARIN SODIUM (PORCINE) 5000 UNIT/ML IJ SOLN
5000.0000 [IU] | Freq: Three times a day (TID) | INTRAMUSCULAR | Status: DC
  Administered 2021-10-16 – 2021-10-17 (×3): 5000 [IU] via SUBCUTANEOUS
  Filled 2021-10-15 (×3): qty 1

## 2021-10-15 MED ORDER — ACETAMINOPHEN 325 MG PO TABS: 650.0000 mg | ORAL_TABLET | Freq: Four times a day (QID) | ORAL | Status: DC | PRN

## 2021-10-15 MED ORDER — SODIUM CHLORIDE 0.9 % IV SOLN: 2.0000 g | Freq: Once | INTRAVENOUS | Status: DC

## 2021-10-15 MED ORDER — PIPERACILLIN-TAZOBACTAM 3.375 G IVPB
3.3750 g | Freq: Once | INTRAVENOUS | Status: AC
  Administered 2021-10-16: 3.375 g via INTRAVENOUS
  Filled 2021-10-15: qty 50

## 2021-10-15 MED ORDER — METOPROLOL SUCCINATE ER 25 MG PO TB24
50.0000 mg | ORAL_TABLET | Freq: Every day | ORAL | Status: DC
  Administered 2021-10-16: 50 mg via ORAL
  Filled 2021-10-15 (×2): qty 2

## 2021-10-15 MED ORDER — ACETAMINOPHEN 650 MG RE SUPP: 650.0000 mg | Freq: Four times a day (QID) | RECTAL | Status: DC | PRN

## 2021-10-15 MED ORDER — INSULIN ASPART 100 UNIT/ML IJ SOLN
0.0000 [IU] | Freq: Three times a day (TID) | INTRAMUSCULAR | Status: DC
  Administered 2021-10-16: 3 [IU] via SUBCUTANEOUS
  Administered 2021-10-16 (×2): 2 [IU] via SUBCUTANEOUS
  Administered 2021-10-17: 1 [IU] via SUBCUTANEOUS

## 2021-10-15 MED ORDER — ATORVASTATIN CALCIUM 40 MG PO TABS
40.0000 mg | ORAL_TABLET | Freq: Every day | ORAL | Status: DC
  Administered 2021-10-16 – 2021-10-17 (×2): 40 mg via ORAL
  Filled 2021-10-15 (×2): qty 1

## 2021-10-15 MED ORDER — LACTATED RINGERS IV BOLUS
1000.0000 mL | Freq: Once | INTRAVENOUS | Status: AC
  Administered 2021-10-15: 1000 mL via INTRAVENOUS

## 2021-10-15 NOTE — ED Triage Notes (Signed)
Patient is a DM and has a right wound infection and sent for rule out osteomyletitis. Reports fever, headache and nausea.

## 2021-10-15 NOTE — Progress Notes (Signed)
Pharmacy Antibiotic Note  Bruce Cabrera is a 56 y.o. male admitted on 10/15/2021 with R foot cellulitis.  Pharmacy has been consulted for Cefepime and Vancomycin dosing.  Pt ordered Zosyn 3.375g IV x1 in ED. WBC 10.1, afebrile, LA 2.0  Plan: Initiate Cefepime 2g IV q8h Initiate loading dose of Vancomycin '2500mg'$  IV x 1, followed by  Vancomycin '1500mg'$  IV q24h (eAUC ~472)    > Goal AUC 400-550    > Check vancomycin levels at steady state  Monitor daily CBC, temp, SCr, and for clinical signs of improvement  F/u cultures and de-escalate antibiotics as able F/u MRI of R foot   Height: '6\' 3"'$  (190.5 cm) Weight: 124.7 kg (275 lb) IBW/kg (Calculated) : 84.5  Temp (24hrs), Avg:98.6 F (37 C), Min:98.2 F (36.8 C), Max:98.9 F (37.2 C)  Recent Labs  Lab 10/15/21 1805 10/15/21 1808  WBC  --  10.1  CREATININE  --  1.82*  LATICACIDVEN 2.0*  --     Estimated Creatinine Clearance: 66.8 mL/min (A) (by C-G formula based on SCr of 1.82 mg/dL (H)).    Allergies  Allergen Reactions   Trulicity [Dulaglutide] Other (See Comments)    Abdominal pain    Antimicrobials this admission: Zosyn 8/28 Cefepime 8/28 >>  Vancomycin 8/28 >>   Dose adjustments this admission: N/A  Microbiology results: 8/28 BCx: pending  Thank you for allowing pharmacy to be a part of this patient's care.  [name] 10/15/2021 11:16 PM

## 2021-10-15 NOTE — Progress Notes (Signed)
   Chief Complaint  Patient presents with   Foot Problem    diabetic right foot callus with open wound on side on bottom of foot    Subjective:  56 y.o. male with PMHx of diabetes mellitus, uncontrolled, last A1c 07/23/2021 was 10.6.  Last seen in our office 07/23/2021.  Patient states that over the past week he noticed a blister and drainage developing around the right great toe joint.  He also noticed an increase of pain and tenderness.  Most recently over the last 24-48 hours he is also been experiencing nausea with chills.  Presents to the office today for further treatment and evaluation   Past Medical History:  Diagnosis Date   Diabetes mellitus without complication ([place])    Hyperlipidemia associated with type 2 diabetes mellitus ([place])    based on 2011 profile   Hypertension     Past Surgical History:  Procedure Laterality Date   FOOT SURGERY Left    LEFT HEART CATHETERIZATION WITH CORONARY ANGIOGRAM N/A 11/29/2013   Procedure: LEFT HEART CATHETERIZATION WITH CORONARY ANGIOGRAM;  Surgeon: Troy Sine, MD;  Location[facility] CATH LAB;  Service: Cardiovascular;  Laterality: N/A;   None      Allergies  Allergen Reactions   Trulicity [Dulaglutide] Other (See Comments)    Abdominal pain        Objective/Physical Exam General: The patient is alert and oriented x3 in no acute distress.  Dermatology:  Wound #1 noted to the right plantar foot with heavy purulent drainage and debris.  Moderate malodor also noted.  Please see above noted photos.  The maceration and wound extends into the interdigital space.  Clinically there does not appear to be any exposed bone or joint.  Vascular: Skin is warm to touch.  Erythema noted encompassing the dorsal aspect of the first MTP joint and toe  Neurological: Light touch and protective threshold diminished bilaterally.   Musculoskeletal Exam: No prior amputations.  Patient ambulatory with full strength 5/5 all compartments.  No crepitus with  palpation throughout the foot  Assessment: 1.  Diabetic foot ulcer complicated by acute onset of cellulitis/abscess right foot  Plan of Care:  1. Patient was evaluated. 2.  Excisional debridement of some of the necrotic nonviable tissue was performed using a 312 scalpel today.  Patient tolerated this well.  Heavy seropurulent drainage was expressed from the ulcer site. 3.  Given the acute onset of the presenting cellulitis and ulcer development, history of uncontrolled diabetes mellitus, and systemic symptoms of nausea and chills, I recommended that the patient go immediately to the emergency department for further evaluation and likely admission pending admission criteria. 4.  Once admitted recommend full work-up including x-ray, MRI, and vascular ABIs 5.  Cultures were also taken today and sent to pathology for culture and sensitivity 6.  Once admitted please consult podiatry and we will follow him inpatient.  Edrick Kins, DPM [facility]  Dr. Edrick Kins, DPM    [address], [place] [address]                Office [phone]  Fax [phone]

## 2021-10-15 NOTE — H&P (Signed)
History and Physical    JACQUEL MCCAMISH OZY:[id number] DOB: 04-26-1965 DOA: 10/15/2021  PCP: Elsie Stain, MD  Patient coming from: Home.  Chief Complaint: Right foot ulceration.  HPI: Bruce Cabrera is a 56 y.o. male with history of diabetes mellitus type 2 last hemoglobin A1c was 10.6 about 84 days ago, hypertension was referred to the ER by patient's podiatrist after patient was found to have worsening wound on the right foot plantar aspect.  Patient states she has been having recurrent wounds on the same leg and this episode started about a week ago with a blister which worsened last Friday about 4 days ago when he started discharging.  Had gone to patient's podiatrist yesterday and had wound debridement done and was referred to the ER since patient had some signs concerning for developing sepsis as patient had chills and rigors.  ED Course: In the ER patient was hemodynamically stable x-rays do not show any bone involvement.  MRI has been ordered patient started empiric antibiotics admitted for further work-up.  Review of Systems: As per HPI, rest all negative.   Past Medical History:  Diagnosis Date   Diabetes mellitus without complication ([place])    Hyperlipidemia associated with type 2 diabetes mellitus ([place])    based on 2011 profile   Hypertension     Past Surgical History:  Procedure Laterality Date   FOOT SURGERY Left    LEFT HEART CATHETERIZATION WITH CORONARY ANGIOGRAM N/A 11/29/2013   Procedure: LEFT HEART CATHETERIZATION WITH CORONARY ANGIOGRAM;  Surgeon: Troy Sine, MD;  Location: [facility] CATH LAB;  Service: Cardiovascular;  Laterality: N/A;   None       reports that he has been smoking cigarettes. He has a 15.00 pack-year smoking history. He has never used smokeless tobacco. He reports current alcohol use. He reports that he does not use drugs.  Allergies  Allergen Reactions   Trulicity [Dulaglutide] Other (See Comments)    Abdominal pain    Family History   Problem Relation Age of Onset   Heart attack Mother 75   Heart failure Brother 49   Stroke Sister     Prior to Admission medications   Medication Sig Start Date End Date Taking? Authorizing Provider  Accu-Chek Softclix Lancets lancets Use as instructed 08/23/20   Elsie Stain, MD  atorvastatin (LIPITOR) 40 MG tablet Take 1 tablet (40 mg total) by mouth daily. 07/23/21   Elsie Stain, MD  azelastine (ASTELIN) 0.1 % nasal spray Place 2 sprays into both nostrils 2 (two) times daily. Use in each nostril as directed 07/23/21   Elsie Stain, MD  Blood Glucose Monitoring Suppl (ACCU-CHEK GUIDE) w/Device KIT Use to check blood sugar twice daily 08/23/20   Elsie Stain, MD  dapagliflozin propanediol (FARXIGA) 10 MG TABS tablet Take 1 tablet (10 mg total) by mouth daily before breakfast. 09/10/21   Elsie Stain, MD  glucose blood (ACCU-CHEK GUIDE) test strip Use as instructed 07/23/21   Elsie Stain, MD  insulin glargine, 1 Unit Dial, (TOUJEO) 300 UNIT/ML Solostar Pen Inject 20 Units into the skin at bedtime. 10/02/21 12/01/21  Elsie Stain, MD  Insulin Pen Needle (TRUEPLUS 5-BEVEL PEN NEEDLES) 32G X 4 MM MISC Use to inject Toujeo once daily. 07/23/21   Elsie Stain, MD  metFORMIN (GLUCOPHAGE) 1000 MG tablet Take 1 tablet (1,000 mg total) by mouth 2 (two) times daily with a meal. 07/23/21   Elsie Stain, MD  metoprolol succinate (  TOPROL-XL) 50 MG 24 hr tablet Take 1 tablet (50 mg total) by mouth daily. Take with or immediately following a meal. 07/23/21   Elsie Stain, MD  nicotine polacrilex (NICORETTE MINI) 4 MG lozenge Take three times a day to quit smoking 12/12/20   Elsie Stain, MD  valsartan-hydrochlorothiazide (DIOVAN-HCT) 320-25 MG tablet Take 1 tablet by mouth daily. 07/23/21   Elsie Stain, MD    Physical Exam: Constitutional: Moderately built and nourished. Vitals:   10/15/21 1548 10/15/21 1622 10/15/21 1840 10/15/21 2152  BP: 105/75 120/88 91/65    Pulse: 100 96 100   Resp: 18 16 16    Temp: 98.9 F (37.2 C) 98.8 F (37.1 C)  98.2 F (36.8 C)  TempSrc: Oral Oral  Oral  SpO2: 100% 98% 100%   Weight: 124.7 kg     Height: 6' 3"  (1.905 m)      Eyes: Anicteric no pallor. ENMT: No discharge from the ears eyes nose and mouth. Neck: No mass felt.  No neck rigidity. Respiratory: No rhonchi or crepitations. Cardiovascular: S1-S2 heard. Abdomen: Soft nontender bowel sound present. Musculoskeletal: Swelling of the right foot warm to touch with an ulcer on the plantar aspect around the first metatarsophalangeal joint measuring around 3 cm with some discharge. Skin: Ulcer ration of the right foot plantar aspect measuring 3 cm around the metatarsophalangeal joint first child. Neurologic: Alert awake oriented to time place and person.  Moves all extremities. Psychiatric: Appears normal.  Normal affect.   Labs on Admission: I have personally reviewed following labs and imaging studies  CBC: Recent Labs  Lab 10/15/21 1808  WBC 10.1  NEUTROABS 6.2  HGB 13.0  HCT 39.7  MCV 76.5*  PLT 354   Basic Metabolic Panel: Recent Labs  Lab 10/15/21 1808  NA 135  K 3.4*  CL 98  CO2 23  GLUCOSE 243*  BUN 20  CREATININE 1.82*  CALCIUM 8.9   GFR: Estimated Creatinine Clearance: 66.8 mL/min (A) (by C-G formula based on SCr of 1.82 mg/dL (H)). Liver Function Tests: Recent Labs  Lab 10/15/21 1808  AST 17  ALT 19  ALKPHOS 50  BILITOT 0.5  PROT 9.1*  ALBUMIN 3.4*   No results for input(s): "LIPASE", "AMYLASE" in the last 168 hours. No results for input(s): "AMMONIA" in the last 168 hours. Coagulation Profile: Recent Labs  Lab 10/15/21 1808  INR 1.0   Cardiac Enzymes: No results for input(s): "CKTOTAL", "CKMB", "CKMBINDEX", "TROPONINI" in the last 168 hours. BNP (last 3 results) No results for input(s): "PROBNP" in the last 8760 hours. HbA1C: No results for input(s): "HGBA1C" in the last 72 hours. CBG: No results for  input(s): "GLUCAP" in the last 168 hours. Lipid Profile: No results for input(s): "CHOL", "HDL", "LDLCALC", "TRIG", "CHOLHDL", "LDLDIRECT" in the last 72 hours. Thyroid Function Tests: No results for input(s): "TSH", "T4TOTAL", "FREET4", "T3FREE", "THYROIDAB" in the last 72 hours. Anemia Panel: No results for input(s): "VITAMINB12", "FOLATE", "FERRITIN", "TIBC", "IRON", "RETICCTPCT" in the last 72 hours. Urine analysis:    Component Value Date/Time   COLORURINE AMBER (A) 11/10/2015 1118   APPEARANCEUR CLEAR 11/10/2015 1118   LABSPEC 1.010 11/10/2015 1118   PHURINE 5.0 11/10/2015 1118   GLUCOSEU 100 (A) 11/10/2015 1118   HGBUR MODERATE (A) 11/10/2015 1118   BILIRUBINUR SMALL (A) 11/10/2015 1118   KETONESUR 15 (A) 11/10/2015 1118   PROTEINUR 100 (A) 11/10/2015 1118   NITRITE NEGATIVE 11/10/2015 1118   LEUKOCYTESUR NEGATIVE 11/10/2015 1118   Sepsis  Labs: @LABRCNTIP (procalcitonin:4,lacticidven:4) )No results found for this or any previous visit (from the past 240 hour(s)).   Radiological Exams on Admission: DG Foot Complete Right  Result Date: 10/15/2021 CLINICAL DATA:  Right foot wound EXAM: RIGHT FOOT COMPLETE - 3+ VIEW COMPARISON:  None Available. FINDINGS: No fracture or dislocation is seen. The joint spaces are preserved. Soft tissue lucency along the plantar aspect of the distal 1st digit near the DIP joint may correspond to the patient's known soft tissue ulcer. No associated cortical destruction to suggest osteomyelitis. IMPRESSION: No radiographic findings of osteomyelitis. Electronically Signed   By: Julian Hy M.D.   On: 10/15/2021 18:18      Assessment/Plan Principal Problem:   Diabetic foot ulcer ([place]) Active Problems:   Essential hypertension   Diabetes mellitus ([place])   Tobacco use    Right foot cellulitis with infected diabetes ulcer for which MRI has been ordered to rule out any osteomyelitis.  On empiric antibiotics.  Podiatrist to follow for further  recommendations.  Check ABI. Diabetes mellitus type 2 last hemoglobin A1c was around 10.6  84 days ago.  On long-acting insulin 20 units at bedtime along with sliding scale coverage. Hypertension on metoprolol.  Holding ARB and hydrochlorothiazide to make sure patient is renal failure is not acute.  As needed IV hydralazine has been added. Renal failure could be progressive worsening of function.  Likely chronic kidney disease stage III.  However patient's creatinine has worsened from May 2022.  Presently holding ARB and hydrochlorothiazide and will make sure patient does not have acute renal failure. Hyperlipidemia on statins.  Since patient has worsening wound on the right leg with ulceration and possible need for further procedures will need close monitoring and inpatient status.   DVT prophylaxis: Heparin. Code Status: Full code. Family Communication: Discussed with patient. Disposition Plan: Home. Consults called: Patient's podiatrist is aware. Admission status: Inpatient.   Rise Patience MD [facility] Pager [phone].  If 7PM-7AM, please contact night-coverage [email or web address] Password [email or web address]  10/15/2021, 10:57 PM

## 2021-10-15 NOTE — ED Provider Triage Note (Signed)
Emergency Medicine Provider Triage Evaluation Note  Bruce Cabrera , a 56 y.o. male  was evaluated in triage.  Pt complains of right foot wound. Sent by podiatry for evaluation and possible admission for osteomyelitis. Has had fevers at home, drainage, and nausea. No vomiting.   Review of Systems  Positive: As per HPI Negative:   Physical Exam  BP 120/88   Pulse 96   Temp 98.8 F (37.1 C) (Oral)   Resp 16   Ht '6\' 3"'$  (1.905 m)   Wt 124.7 kg   SpO2 98%   BMI 34.37 kg/m  Gen:   Awake, no distress   Resp:  Normal effort  MSK:   Moves extremities without difficulty  Other:  Wound bandaged. Pictures of wound in chart as per podiatry.  Medical Decision Making  Medically screening exam initiated at 5:39 PM.  Appropriate orders placed.  Bruce Cabrera was informed that the remainder of the evaluation will be completed by another provider, this initial triage assessment does not replace that evaluation, and the importance of remaining in the ED until their evaluation is complete.  Work-up initiated.    Kmya Placide A, PA-C 10/15/21 1742

## 2021-10-15 NOTE — ED Notes (Signed)
Right foot wrapped in ace wrap convering a gauze dressing

## 2021-10-15 NOTE — ED Provider Notes (Signed)
[facility] EMERGENCY DEPARTMENT Provider Note   CSN: [id number] Arrival date & time: 10/15/21  1537     History Chief Complaint  Patient presents with   Foot Pain    HPI Bruce Cabrera is a 56 y.o. male presenting for acute right foot sore.  Patient has an extensive medical history including hypertension, diabetes.  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  He follows with podiatry who evaluated his foot today and had concern for a developing abscess with concern for osteomyelitis.  They performed bedside I&D with copious serosanguineous and mucopurulent drainage.  Patient endorses subjective fevers and chills as well as developing nausea without vomiting.  He has been having severe pain in the right foot.  He still has feeling in the toes though it is limited. Patient otherwise ambulatory with difficulty..   Patient's recorded medical, surgical, social, medication list and allergies were reviewed in the Snapshot window as part of the initial history.   Review of Systems   Review of Systems  Constitutional:  Negative for chills and fever.  HENT:  Negative for ear pain and sore throat.   Eyes:  Negative for pain and visual disturbance.  Respiratory:  Negative for cough and shortness of breath.   Cardiovascular:  Negative for chest pain and palpitations.  Gastrointestinal:  Negative for abdominal pain and vomiting.  Genitourinary:  Negative for dysuria and hematuria.  Musculoskeletal:  Negative for arthralgias and back pain.  Skin:  Negative for color change and rash.  Neurological:  Negative for seizures and syncope.  All other systems reviewed and are negative.   Physical Exam Updated Vital Signs BP 91/65   Pulse 100   Temp 98.2 F (36.8 C) (Oral)   Resp 16   Ht '6\' 3"'$  (1.905 m)   Wt 124.7 kg   SpO2 100%   BMI 34.37 kg/m  Physical Exam Vitals and nursing note reviewed.  Constitutional:       General: He is not in acute distress.    Appearance: He is well-developed.  HENT:     Head: Normocephalic and atraumatic.  Eyes:     Conjunctiva/sclera: Conjunctivae normal.  Cardiovascular:     Rate and Rhythm: Normal rate and regular rhythm.     Heart sounds: No murmur heard. Pulmonary:     Effort: Pulmonary effort is normal. No respiratory distress.     Breath sounds: Normal breath sounds.  Abdominal:     Palpations: Abdomen is soft.     Tenderness: There is no abdominal tenderness.  Musculoskeletal:        General: Deformity and signs of injury present. No swelling.     Cervical back: Neck supple.  Skin:    General: Skin is warm and dry.     Capillary Refill: Capillary refill takes less than 2 seconds.  Neurological:     Mental Status: He is alert.  Psychiatric:        Mood and Affect: Mood normal.         ED Course/ Medical Decision Making/ A&P    Procedures Procedures   Medications Ordered in ED Medications  vancomycin (VANCOCIN) 2,500 mg in sodium chloride 0.9 % 500 mL IVPB (has no administration in time range)  ceFEPIme (MAXIPIME) 2 g in sodium chloride 0.9 % 100 mL IVPB (has no administration in time range)  piperacillin-tazobactam (ZOSYN) IVPB 3.375 g (has no administration in time range)    Medical Decision  Making:    GLEB MCGUIRE is a 56 y.o. male who presented to the ED today with acute on chronic right foot sore detailed above.     Patient's presentation is complicated by their history of multiple comorbid medical problems.  Patient placed on continuous telemetry in the emergency department which was monitored periodically..   Complete initial physical exam performed, notably the patient  was hemodynamically stable in no acute distress.  He has an obvious ulceration to the right foot.  See picture above..      Reviewed and confirmed nursing documentation for past medical history, family history, social history.    Initial Assessment:    Patient's history of present illness and physical findings are most consistent with diabetic foot ulcer with erosions and concerning osteomyelitis possible formation.  Will evaluate further with MRI of the right foot.  In the interim, we will treat patient empirically with vancomycin and piperacillin/tazobactam with plan for admission for ongoing care and management.  See same-day podiatry consultation note for ongoing inpatient plan. Consulted hospitalist who agreed with need for admission.  Patient arranged for admission with no further acute events. Disposition:   Based on the above findings, I believe this patient is stable for admission.    Patient/family educated about specific findings on our evaluation and explained exact reasons for admission.  Patient/family educated about clinical situation and time was allowed to answer questions.   Admission team communicated with and agreed with need for admission. Patient admitted. Patient ready to move at this time.     Emergency Department Medication Summary:   Medications  vancomycin (VANCOCIN) 2,500 mg in sodium chloride 0.9 % 500 mL IVPB (has no administration in time range)  ceFEPIme (MAXIPIME) 2 g in sodium chloride 0.9 % 100 mL IVPB (has no administration in time range)  piperacillin-tazobactam (ZOSYN) IVPB 3.375 g (has no administration in time range)        Clinical Impression:  1. Diabetic ulcer of right foot associated with type 2 diabetes mellitus, unspecified part of foot, unspecified ulcer stage ([place])      Data Unavailable   Final Clinical Impression(s) / ED Diagnoses Final diagnoses:  Diabetic ulcer of right foot associated with type 2 diabetes mellitus, unspecified part of foot, unspecified ulcer stage [facility])    Rx / DC Orders ED Discharge Orders     None         Tretha Sciara, MD 10/16/21 0045

## 2021-10-16 ENCOUNTER — Inpatient Hospital Stay (HOSPITAL_COMMUNITY): Payer: BC Managed Care – PPO

## 2021-10-16 DIAGNOSIS — E08621 Diabetes mellitus due to underlying condition with foot ulcer: Secondary | ICD-10-CM

## 2021-10-16 DIAGNOSIS — L0390 Cellulitis, unspecified: Secondary | ICD-10-CM

## 2021-10-16 DIAGNOSIS — N1832 Chronic kidney disease, stage 3b: Secondary | ICD-10-CM

## 2021-10-16 DIAGNOSIS — E785 Hyperlipidemia, unspecified: Secondary | ICD-10-CM

## 2021-10-16 DIAGNOSIS — I70229 Atherosclerosis of native arteries of extremities with rest pain, unspecified extremity: Secondary | ICD-10-CM | POA: Diagnosis not present

## 2021-10-16 DIAGNOSIS — E872 Acidosis, unspecified: Secondary | ICD-10-CM

## 2021-10-16 DIAGNOSIS — L97401 Non-pressure chronic ulcer of unspecified heel and midfoot limited to breakdown of skin: Secondary | ICD-10-CM

## 2021-10-16 DIAGNOSIS — Z720 Tobacco use: Secondary | ICD-10-CM

## 2021-10-16 DIAGNOSIS — N179 Acute kidney failure, unspecified: Secondary | ICD-10-CM

## 2021-10-16 DIAGNOSIS — E11621 Type 2 diabetes mellitus with foot ulcer: Secondary | ICD-10-CM

## 2021-10-16 DIAGNOSIS — E1165 Type 2 diabetes mellitus with hyperglycemia: Secondary | ICD-10-CM

## 2021-10-16 DIAGNOSIS — I120 Hypertensive chronic kidney disease with stage 5 chronic kidney disease or end stage renal disease: Secondary | ICD-10-CM

## 2021-10-16 DIAGNOSIS — E0800 Diabetes mellitus due to underlying condition with hyperosmolarity without nonketotic hyperglycemic-hyperosmolar coma (NKHHC): Secondary | ICD-10-CM

## 2021-10-16 DIAGNOSIS — I10 Essential (primary) hypertension: Secondary | ICD-10-CM | POA: Diagnosis not present

## 2021-10-16 DIAGNOSIS — L97509 Non-pressure chronic ulcer of other part of unspecified foot with unspecified severity: Secondary | ICD-10-CM | POA: Diagnosis not present

## 2021-10-16 DIAGNOSIS — L03115 Cellulitis of right lower limb: Secondary | ICD-10-CM

## 2021-10-16 LAB — URINALYSIS, ROUTINE W REFLEX MICROSCOPIC
Bacteria, UA: NONE SEEN
Bilirubin Urine: NEGATIVE
Glucose, UA: >=500 mg/dL — AB
Hgb urine dipstick: NEGATIVE
Ketones, ur: NEGATIVE mg/dL
Leukocytes,Ua: NEGATIVE
Nitrite: NEGATIVE
Protein, ur: NEGATIVE mg/dL
Specific Gravity, Urine: 1.016 (ref 1.005–1.030)
pH: 5 (ref 5.0–8.0)

## 2021-10-16 LAB — BASIC METABOLIC PANEL
Anion gap: 14 (ref 5–15)
BUN: 21 mg/dL — ABNORMAL HIGH (ref 6–20)
CO2: 24 mmol/L (ref 22–32)
Calcium: 8.8 mg/dL — ABNORMAL LOW (ref 8.9–10.3)
Chloride: 100 mmol/L (ref 98–111)
Creatinine, Ser: 1.73 mg/dL — ABNORMAL HIGH (ref 0.61–1.24)
GFR, Estimated: 47 mL/min — ABNORMAL LOW (ref >60)
Glucose, Bld: 170 mg/dL — ABNORMAL HIGH (ref 70–99)
Potassium: 3.8 mmol/L (ref 3.5–5.1)
Sodium: 138 mmol/L (ref 135–145)

## 2021-10-16 LAB — CBC
HCT: 38.5 % — ABNORMAL LOW (ref 39.0–52.0)
Hemoglobin: 12.5 g/dL — ABNORMAL LOW (ref 13.0–17.0)
MCH: 25.1 pg — ABNORMAL LOW (ref 26.0–34.0)
MCHC: 32.5 g/dL (ref 30.0–36.0)
MCV: 77.2 fL — ABNORMAL LOW (ref 80.0–100.0)
Platelets: 244 10*3/uL (ref 150–400)
RBC: 4.99 MIL/uL (ref 4.22–5.81)
RDW: 14.2 % (ref 11.5–15.5)
WBC: 8.6 10*3/uL (ref 4.0–10.5)
nRBC: 0 % (ref 0.0–0.2)

## 2021-10-16 LAB — HIV ANTIBODY (ROUTINE TESTING W REFLEX): HIV Screen 4th Generation wRfx: NONREACTIVE

## 2021-10-16 LAB — CBG MONITORING, ED: Glucose-Capillary: 145 mg/dL — ABNORMAL HIGH (ref 70–99)

## 2021-10-16 LAB — GLUCOSE, CAPILLARY
Glucose-Capillary: 164 mg/dL — ABNORMAL HIGH (ref 70–99)
Glucose-Capillary: 160 mg/dL — ABNORMAL HIGH (ref 70–99)
Glucose-Capillary: 208 mg/dL — ABNORMAL HIGH (ref 70–99)
Glucose-Capillary: 191 mg/dL — ABNORMAL HIGH (ref 70–99)

## 2021-10-16 MED ORDER — HYDROMORPHONE HCL 1 MG/ML IJ SOLN: 1.0000 mg | INTRAMUSCULAR | Status: DC | PRN

## 2021-10-16 MED ORDER — HYDROCODONE-ACETAMINOPHEN 5-325 MG PO TABS
1.0000 | ORAL_TABLET | ORAL | Status: DC | PRN
  Administered 2021-10-16 (×2): 2 via ORAL
  Administered 2021-10-17: 1 via ORAL
  Filled 2021-10-16 (×3): qty 2

## 2021-10-16 MED ORDER — INSULIN ASPART 100 UNIT/ML IJ SOLN
3.0000 [IU] | Freq: Three times a day (TID) | INTRAMUSCULAR | Status: DC
  Administered 2021-10-16 – 2021-10-17 (×3): 3 [IU] via SUBCUTANEOUS

## 2021-10-16 MED ORDER — VANCOMYCIN HCL 1500 MG/300ML IV SOLN
1500.0000 mg | INTRAVENOUS | Status: DC
  Administered 2021-10-16: 1500 mg via INTRAVENOUS
  Filled 2021-10-16: qty 300

## 2021-10-16 NOTE — Consult Note (Signed)
Subjective:  Patient ID: Bruce Cabrera, male    DOB: 1965-10-07,  MRN: [id number]  Patient with past medical history of DM type 2, HTN seen at beside today for worsening right foot wound. He was sent to the ED yesterday by Dr. Amalia Hailey in clinic due to worsening right foot wound and blister that started in the last week with drainage as well as nausea and chills.  Relates this morning pain is manageable and nausea and chills improved.    Past Medical History:  Diagnosis Date   Diabetes mellitus without complication ([place])    Hyperlipidemia associated with type 2 diabetes mellitus ([place])    based on 2011 profile   Hypertension      Past Surgical History:  Procedure Laterality Date   FOOT SURGERY Left    LEFT HEART CATHETERIZATION WITH CORONARY ANGIOGRAM N/A 11/29/2013   Procedure: LEFT HEART CATHETERIZATION WITH CORONARY ANGIOGRAM;  Surgeon: Troy Sine, MD;  Location: [facility] CATH LAB;  Service: Cardiovascular;  Laterality: N/A;   None         Latest Ref Rng & Units 10/16/2021    2:49 AM 10/15/2021    6:08 PM 05/16/2020    9:05 AM  CBC  WBC 4.0 - 10.5 K/uL 8.6  10.1  5.8   Hemoglobin 13.0 - 17.0 g/dL 12.5  13.0  17.7   Hematocrit 39.0 - 52.0 % 38.5  39.7  53.1   Platelets 150 - 400 K/uL 244  280  196        Latest Ref Rng & Units 10/16/2021    2:49 AM 10/15/2021    6:08 PM 12/12/2020   10:15 AM  BMP  Glucose 70 - 99 mg/dL 170  243  161   BUN 6 - 20 mg/dL '21  20  21   '$ Creatinine 0.61 - 1.24 mg/dL 1.73  1.82  1.53   BUN/Creat Ratio 9 - 20   14   Sodium 135 - 145 mmol/L 138  135  139   Potassium 3.5 - 5.1 mmol/L 3.8  3.4  4.3   Chloride 98 - 111 mmol/L 100  98  101   CO2 22 - 32 mmol/L '24  23  23   '$ Calcium 8.9 - 10.3 mg/dL 8.8  8.9  9.5      Objective:   Vitals:   10/16/21 0246 10/16/21 0500  BP: 111/63 118/68  Pulse: 98 67  Resp: 17 20  Temp:  98.2 F (36.8 C)  SpO2: 98% 98%    General:AA&O x 3. Normal mood and affect   Vascular: DP and PT pulses 2/4 bilateral.  Brisk capillary refill to all digits. Pedal hair present   Neruological. Epicritic sensation grossly intact.   Derm: Right plantar foot wound with granular fibrotic base measuring 5 cm x 3 cm x 0.2 cm . No erythema or edema present. No purulence. No probe to bone. Interspaces with some maceration noted.   MSK: MMT 5/5 in dorsiflexion, plantar flexion, inversion and eversion. Normal joint ROM without pain or crepitus.      Assessment & Plan:  Patient was evaluated and treated and all questions answered.  DX: Diabetic right foot ulcer with cellulitis Wound care: betadine, DSD  Antibiotics: Per primary  DME: post-op shoe  Discussed with patient diagnosis and treatment options.  Imaging reviewed. MRI read still pending. Awaiting ABIs as well.  Discussed with patient options pending studies. Discussed if MRI negative for any osteomyelitis will likely be able to proceed with  antibiotics and wound care. If any osteomyelitis present discussed possible need for toe amputation. Will wait final results.  Patient in agreement with plan and all questions answered.  Podiatry will continue to follow.   Lorenda Peck, DPM  Accessible via secure chat for questions or concerns.

## 2021-10-16 NOTE — Plan of Care (Signed)
Reviewed MRI and showing no acute osteomyelitis. At this time patient will continue with wound care and will need two weeks oral antibiotics upon discharge. Patient will be heel weight bearing in post-op shoe and will need daily dressing changes with betadine and DSD. He will follow-up in our clinic in one week. He is ok for discharge from our standpoint. Podiatry to sign off.

## 2021-10-16 NOTE — Progress Notes (Signed)
Pharmacy Antibiotic Note  Bruce Cabrera is a 56 y.o. male admitted on 10/15/2021 with R foot cellulitis.  Pharmacy has been consulted for Cefepime and Vancomycin dosing.  Zosyn 3.375g IV x1 in ED, with WBC 10.1, LA 2.0.  Afebrile, MRI w/NO acute osteomyelitis of R forefoot.  Plan: Cefepime 2g IV q8h Loading dose of Vancomycin '2500mg'$  IV x 1, followed by  Vancomycn '1500mg'$  IV q24h (eAUC ~475, SCr 1.73, Vd coefficient 0.5, wt used 118.3 kg)    > Goal AUC 400-550    > Check vancomycin levels at steady state  Monitor daily CBC, temp, SCr, and for clinical signs of improvement  F/u cultures and de-escalate antibiotics as able F/u podiatry   Height: '6\' 3"'$  (190.5 cm) Weight: 118.3 kg (260 lb 12.9 oz) IBW/kg (Calculated) : 84.5  Temp (24hrs), Avg:98.5 F (36.9 C), Min:98.2 F (36.8 C), Max:98.9 F (37.2 C)  Recent Labs  Lab 10/15/21 1805 10/15/21 1808 10/15/21 2244 10/16/21 0249  WBC  --  10.1  --  8.6  CREATININE  --  1.82*  --  1.73*  LATICACIDVEN 2.0*  --  1.8  --      Estimated Creatinine Clearance: 68.4 mL/min (A) (by C-G formula based on SCr of 1.73 mg/dL (H)).    Allergies  Allergen Reactions   Trulicity [Dulaglutide] Other (See Comments)    Abdominal pain    Antimicrobials this admission: 8/29 Zosyn x 1 8/29 vanc >>  8/29 cefepime >>   Microbiology results: 8/28 BCx: ng x 12h   Thank you for allowing [place] to participate in this patients care. Jens Som, PharmD 10/16/2021 12:31 PM  **Pharmacist phone directory can be found on [email or web address] listed under [place]**

## 2021-10-16 NOTE — Progress Notes (Signed)
ABI has been completed.   Preliminary results in CV Proc.   Karli Wickizer Elmus Mathes 10/16/2021 2:28 PM

## 2021-10-16 NOTE — Progress Notes (Signed)
[facility]                                                                              Bruce Cabrera, is a 56 y.o. male, DOB - February 19, 1966, GQQ:[id number] Admit date - 10/15/2021    Outpatient Primary MD for the patient is Elsie Stain, MD  LOS - 1  days  Chief Complaint  Patient presents with   Foot Pain       Brief summary   Patient is a 56 year old male with diabetes mellitus type 2, last A1c 10.6, HTN, was referred to ED by patient podiatrist after found to have worsening wound on the right foot plantar aspect.  Patient reported recurrent wounds on the same foot and started about a week ago with a blister which worsened 4 days ago and then started having discharge.  He went to his podiatrist on 8/28 and had wound debridement done, was referred to ED.   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  No abscess -Continue IV vancomycin, cefepime, pain control -Management per podiatry  Active Problems:   Essential hypertension -BP stable, continue Toprol-XL, hydralazine as needed with parameters  Hyperlipidemia -Continue Lipitor     Diabetes mellitus ([place]) type II, uncontrolled with hyperglycemia, diabetic foot ulcer -Hemoglobin A1c 10.6 on 07/23/2021 Recent Labs    10/16/21 0420 10/16/21 0739 10/16/21 1150  GLUCAP 145* 164* 160*    Continue Semglee 20 units at bedtime, continue sliding scale insulin Add NovoLog 3 units 3 times daily AC -Hold metformin  Acute kidney injury on CKD stage IIIb, with lactic acidosis -Presented with creatinine of 1.8, baseline 1.5 on 12/12/2020 -Hold valsartan, HCTZ, Farxiga -Creatinine improving, 1.7 -Lactic acid improved 1.8  Obesity Estimated body mass index is 32.6 kg/m as  calculated from the following:   Height as of this encounter: '6\' 3"'$  (1.905 m).   Weight as of this encounter: 118.3 kg.  Code Status: Full code DVT Prophylaxis:  heparin injection 5,000 Units Start: 10/16/21 1400   Level of Care: Level of care: Telemetry Medical Family Communication: Updated patient, alert and oriented   Disposition Plan:      Remains inpatient appropriate: On IV antibiotics, podiatry following  Procedures:  MRI foot  Consultants:   Podiatry Antimicrobials:   Anti-infectives (From admission, onward)    Start     Dose/Rate Route Frequency Ordered Stop   10/16/21 2300  vancomycin (VANCOREADY) IVPB 1500 mg/300 mL        1,500 mg 150 mL/hr over 120 Minutes Intravenous Every 24 hours 10/15/21 2315     10/16/21 0600  ceFEPIme (MAXIPIME) 2 g in sodium chloride 0.9 % 100 mL IVPB        2 g 200 mL/hr over 30 Minutes Intravenous Every 8 hours 10/15/21 2315 10/23/21 0559   10/15/21 2300  ceFEPIme (MAXIPIME) 2 g in sodium chloride 0.9 % 100 mL IVPB  Status:  Discontinued  2 g 200 mL/hr over 30 Minutes Intravenous  Once 10/15/21 2257 10/15/21 2313   10/15/21 2230  vancomycin (VANCOCIN) 2,500 mg in sodium chloride 0.9 % 500 mL IVPB        2,500 mg 262.5 mL/hr over 120 Minutes Intravenous  Once 10/15/21 2200 10/16/21 0411   10/15/21 2215  ceFEPIme (MAXIPIME) 2 g in sodium chloride 0.9 % 100 mL IVPB  Status:  Discontinued        2 g 200 mL/hr over 30 Minutes Intravenous  Once 10/15/21 2200 10/15/21 2212   10/15/21 2215  piperacillin-tazobactam (ZOSYN) IVPB 3.375 g        3.375 g 12.5 mL/hr over 240 Minutes Intravenous Once 10/15/21 2203 10/16/21 0557          Medications  atorvastatin  40 mg Oral Daily   heparin  5,000 Units Subcutaneous Q8H   insulin aspart  0-9 Units Subcutaneous TID WC   insulin glargine-yfgn  20 Units Subcutaneous QHS   metoprolol succinate  50 mg Oral Daily      Subjective:   Bruce Cabrera was seen and examined today.  Complaining  of pain in the right foot, plantar aspect.  Patient denies dizziness, chest pain, shortness of breath, abdominal pain, N/V/D/C. No acute events overnight.  No fevers  Objective:   Vitals:   10/16/21 0500 10/16/21 0552 10/16/21 0738 10/16/21 1149  BP: 118/68  121/72 110/68  Pulse: 67  (!) 50 65  Resp: '20  17 17  '$ Temp: 98.2 F (36.8 C)  98.6 F (37 C) 98.4 F (36.9 C)  TempSrc: Oral  Oral Oral  SpO2: 98%  99% 98%  Weight:  118.3 kg    Height:  '6\' 3"'$  (1.905 m)      Intake/Output Summary (Last 24 hours) at 10/16/2021 1241 Last data filed at 10/16/2021 1152 Gross per 24 hour  Intake 1045.97 ml  Output --  Net 1045.97 ml     Wt Readings from Last 3 Encounters:  10/16/21 118.3 kg  09/10/21 125.1 kg  07/23/21 125 kg     Exam General: Alert and oriented x 3, NAD Cardiovascular: S1 S2 auscultated,  RRR Respiratory: Clear to auscultation bilaterally, no wheezing Gastrointestinal: Soft, nontender, nondistended, + bowel sounds Ext: no pedal edema bilaterally Neuro: no new deficits Skin: Ulcer on the right foot, plantar aspect, no erythema or purulence.  Psych: Normal affect and demeanor, alert and oriented x3     Data Reviewed:  I have personally reviewed following labs    CBC Lab Results  Component Value Date   WBC 8.6 10/16/2021   RBC 4.99 10/16/2021   HGB 12.5 (L) 10/16/2021   HCT 38.5 (L) 10/16/2021   MCV 77.2 (L) 10/16/2021   MCH 25.1 (L) 10/16/2021   PLT 244 10/16/2021   MCHC 32.5 10/16/2021   RDW 14.2 10/16/2021   LYMPHSABS 2.8 10/15/2021   MONOABS 0.8 10/15/2021   EOSABS 0.3 10/15/2021   BASOSABS 0.0 02/58/5277     Last metabolic panel Lab Results  Component Value Date   NA 138 10/16/2021   K 3.8 10/16/2021   CL 100 10/16/2021   CO2 24 10/16/2021   BUN 21 (H) 10/16/2021   CREATININE 1.73 (H) 10/16/2021   GLUCOSE 170 (H) 10/16/2021   GFRNONAA 47 (L) 10/16/2021   GFRAA >60 05/16/2017   CALCIUM 8.8 (L) 10/16/2021   PROT 9.1 (H) 10/15/2021    ALBUMIN 3.4 (L) 10/15/2021   LABGLOB 3.1 12/12/2020   AGRATIO 1.4 12/12/2020  BILITOT 0.5 10/15/2021   ALKPHOS 50 10/15/2021   AST 17 10/15/2021   ALT 19 10/15/2021   ANIONGAP 14 10/16/2021    CBG (last 3)  Recent Labs    10/16/21 0420 10/16/21 0739 10/16/21 1150  GLUCAP 145* 164* 160*      Coagulation Profile: Recent Labs  Lab 10/15/21 1808  INR 1.0     Radiology Studies: I have personally reviewed the imaging studies  MR FOOT RIGHT WO CONTRAST  Result Date: 10/16/2021 CLINICAL DATA:  Foot swelling, diabetic, osteomyelitis suspected EXAM: MRI OF THE RIGHT FOREFOOT WITHOUT CONTRAST TECHNIQUE: Multiplanar, multisequence MR imaging of the right forefoot was performed. No intravenous contrast was administered. COMPARISON:  X-ray 10/15/2021 FINDINGS: Bones/Joint/Cartilage No acute fracture. No dislocation. No bone marrow edema or marrow replacement. No erosion. No significant arthropathy. Trace first MTP joint effusion, nonspecific. Ligaments Intact Lisfranc ligament.  Intact collateral ligaments. Muscles and Tendons Diffuse edema-like signal throughout the foot musculature, likely reflecting denervation changes. Intact flexor and extensor tendons without tendinosis, tear, or tenosynovitis. Soft tissues Skin thickening with superficial wound or ulceration along the plantar aspect of the distal forefoot near the first MTP joint level. Generalized soft tissue edema. No organized fluid collection. IMPRESSION: 1. No acute osteomyelitis of the right forefoot. 2. Skin thickening with superficial wound or ulceration along the plantar aspect of the distal forefoot near the first MTP joint level. Generalized soft tissue edema without organized fluid collection. Electronically Signed   By: Davina Poke D.O.   On: 10/16/2021 08:11   DG Foot Complete Right  Result Date: 10/15/2021 CLINICAL DATA:  Right foot wound EXAM: RIGHT FOOT COMPLETE - 3+ VIEW COMPARISON:  None Available. FINDINGS: No  fracture or dislocation is seen. The joint spaces are preserved. Soft tissue lucency along the plantar aspect of the distal 1st digit near the DIP joint may correspond to the patient's known soft tissue ulcer. No associated cortical destruction to suggest osteomyelitis. IMPRESSION: No radiographic findings of osteomyelitis. Electronically Signed   By: Julian Hy M.D.   On: 10/15/2021 18:18       Granvil Djordjevic M.D. [facility] 10/16/2021, 12:41 PM  Available via Epic secure chat 7am-7pm After 7 pm, please refer to night coverage provider listed on amion.

## 2021-10-17 ENCOUNTER — Other Ambulatory Visit: Payer: Self-pay

## 2021-10-17 ENCOUNTER — Encounter: Payer: Self-pay | Admitting: Family Medicine

## 2021-10-17 DIAGNOSIS — L97401 Non-pressure chronic ulcer of unspecified heel and midfoot limited to breakdown of skin: Secondary | ICD-10-CM | POA: Diagnosis not present

## 2021-10-17 DIAGNOSIS — E08621 Diabetes mellitus due to underlying condition with foot ulcer: Secondary | ICD-10-CM | POA: Diagnosis not present

## 2021-10-17 DIAGNOSIS — L97511 Non-pressure chronic ulcer of other part of right foot limited to breakdown of skin: Secondary | ICD-10-CM

## 2021-10-17 LAB — GLUCOSE, CAPILLARY
Glucose-Capillary: 114 mg/dL — ABNORMAL HIGH (ref 70–99)
Glucose-Capillary: 139 mg/dL — ABNORMAL HIGH (ref 70–99)

## 2021-10-17 MED ORDER — CIPROFLOXACIN HCL 500 MG PO TABS
500.0000 mg | ORAL_TABLET | Freq: Two times a day (BID) | ORAL | 0 refills | Status: AC
  Filled 2021-10-17: qty 20, 10d supply, fill #0

## 2021-10-17 MED ORDER — HYDROCODONE-ACETAMINOPHEN 5-325 MG PO TABS
1.0000 | ORAL_TABLET | ORAL | 0 refills | Status: DC | PRN
  Filled 2021-10-17: qty 30, 4d supply, fill #0

## 2021-10-17 MED ORDER — CLINDAMYCIN HCL 300 MG PO CAPS
300.0000 mg | ORAL_CAPSULE | Freq: Three times a day (TID) | ORAL | 0 refills | Status: AC
  Filled 2021-10-17: qty 30, 10d supply, fill #0

## 2021-10-17 NOTE — Discharge Summary (Signed)
Physician Discharge Summary  Bruce Cabrera MWN:[id number] DOB: 02-17-1966 DOA: 10/15/2021  PCP: Elsie Stain, MD  Admit date: 10/15/2021 Discharge date: 10/17/2021  Time spent: 36 minutes  Recommendations for Outpatient Follow-up:  Requires 10 days of antibiotics clindamycin ciprofloxacin on discharge for diabetic foot wound Prior to monofilament testing and further follow-up with Dr. Amalia Hailey within 3 to 4 days to ensure wound looks good-patient has been followed by Dr. Amalia Hailey chronically for the past year Limited prescription of pain meds given Smoking cessation counseling to continue in outpatient setting  Discharge Diagnoses:  MAIN problem for hospitalization   Diabetic foot wound and ulcer  Please see below for itemized issues addressed in [place]- refer to other progress notes for clarity if needed  Discharge Condition: Improved  Diet recommendation: Diabetic heart healthy  Filed Weights   10/15/21 1548 10/16/21 0552  Weight: 124.7 kg 118.3 kg    History of present illness:  56 year old black male known DM TY 2 with poor control A1c 10.6 HTN, known recurrent plantar wounds of the right foot--he has undergone several excisional debridement hyperkeratotic callus tissue was with Dr. Amalia Hailey  He smokes a lot and works at [employer] where they can smoke unrestrictedly   Patient was admitted on 8/28 with worsening wound on the right foot with a blister and started having a discharge he went to the podiatrist had wound debridement and was referred to ER because of signs concerning for sepsis as had chills and rigors  Patient was hospitalized kept on empiric IV antibiotics x-rays were stable Podiatrist Dr. Blenda Mounts saw the patient and MRI was ordered which did not confirm osteomyelitis-ABIs were performed that were negative for any blockages  Patient was placed with dressings wet-to-dry and was recommended to follow-up with Dr. Amalia Hailey in the outpatient was given limited prescription  of pain meds and should complete a total of 14 days of antibiotics on discharge-we prescribed Cipro and clindamycin  He was also given a letter excusing him from work so that this would facilitate him going back to see Dr. Amalia Hailey in the outpatient setting for review of the wound  He has had some progression of his renal disease and as such we felt that we should hold his ARB HCTZ combination until repeat labs  Discharge Exam: Vitals:   10/17/21 0307 10/17/21 0801  BP: 99/65 109/67  Pulse: (!) 56 63  Resp:  17  Temp: 98.2 F (36.8 C) 98.6 F (37 C)  SpO2: 97% 97%    Subj on day of d/c   Awake coherent no distress  General Exam on discharge  EOMI NCAT no focal deficit no icterus no pallor no rales no rhonchi no wheeze S1-S2 no murmur Neurologically intact Not examined covered likely in dressing ROM intact  Discharge Instructions   Discharge Instructions     Diet - low sodium heart healthy   Complete by: As directed    Discharge instructions   Complete by: As directed    You were diagnosed with a superficial infection of your foot-this probably arose from the wound breaking down and could be worsened by poor diabetic control versus smoking--- please call Dr. Amalia Hailey and see him within 4 to 5 days I have given you a work note and will ask home health nursing to come out and educate you regarding your wound and ulcer with regards to dressing changes etc. and we will keep you out of work until that time Please do not continue smoking-please continue your medications  including your diabetic medications Look at your med list carefully as one of your combination medications with valsartan HCTZ has been discontinued-this does need to be readdressed in the outpatient setting if you actually need this-at this time I would hold it because your kidney function was a little bit off You will get a limited prescription of pain medications with hydrocodone-you can use Tylenol for  breakthrough  Try as far as possible not to get the area wet as this will slow down healing and can cause worsening  Report to medical care if you experience high-grade fevers, chills, nausea, vomiting   Discharge wound care:   Complete by: As directed    Daily wet-to-dry dressings at home   Increase activity slowly   Complete by: As directed       Allergies as of 10/17/2021       Reactions   Trulicity [dulaglutide] Other (See Comments)   Abdominal pain        Medication List     STOP taking these medications    valsartan-hydrochlorothiazide 320-25 MG tablet Commonly known as: DIOVAN-HCT       TAKE these medications    Accu-Chek Guide test strip Generic drug: glucose blood Use as instructed   Accu-Chek Guide w/Device Kit Use to check blood sugar twice daily   Accu-Chek Softclix Lancets lancets Use as instructed   atorvastatin 40 MG tablet Commonly known as: LIPITOR Take 1 tablet (40 mg total) by mouth daily.   azelastine 0.1 % nasal spray Commonly known as: ASTELIN Place 2 sprays into both nostrils 2 (two) times daily. Use in each nostril as directed What changed:  when to take this reasons to take this additional instructions   ciprofloxacin 500 MG tablet Commonly known as: Cipro Take 1 tablet (500 mg total) by mouth 2 (two) times daily for 10 days.   clindamycin 300 MG capsule Commonly known as: CLEOCIN Take 1 capsule (300 mg total) by mouth 3 (three) times daily for 10 days.   Farxiga 10 MG Tabs tablet Generic drug: dapagliflozin propanediol Take 1 tablet (10 mg total) by mouth daily before breakfast.   HYDROcodone-acetaminophen 5-325 MG tablet Commonly known as: NORCO/VICODIN Take 1-2 tablets by mouth every 4 (four) hours as needed for moderate pain.   metFORMIN 1000 MG tablet Commonly known as: GLUCOPHAGE Take 1 tablet (1,000 mg total) by mouth 2 (two) times daily with a meal.   metoprolol succinate 50 MG 24 hr tablet Commonly known  as: TOPROL-XL Take 1 tablet (50 mg total) by mouth daily. Take with or immediately following a meal. What changed: additional instructions   nicotine polacrilex 4 MG lozenge Commonly known as: Nicorette Mini Take three times a day to quit smoking   Toujeo SoloStar 300 UNIT/ML Solostar Pen Generic drug: insulin glargine (1 Unit Dial) Inject 20 Units into the skin at bedtime.   TRUEplus 5-Bevel Pen Needles 32G X 4 MM Misc Generic drug: Insulin Pen Needle Use to inject Toujeo once daily.               Discharge Care Instructions  (From admission, onward)           Start     Ordered   10/17/21 0000  Discharge wound care:       Comments: Daily wet-to-dry dressings at home   10/17/21 8469           Allergies  Allergen Reactions   Trulicity [Dulaglutide] Other (See Comments)    Abdominal pain  The results of significant diagnostics from this hospitalization (including imaging, microbiology, ancillary and laboratory) are listed below for reference.    Significant Diagnostic Studies: VAS [place] ABI WITH/WO TBI  Result Date: 10/16/2021  LOWER EXTREMITY DOPPLER STUDY Patient Name:  Bruce Cabrera  Date of Exam:   10/16/2021 Medical Rec #: [id number]     Accession #:    [id number] Date of Birth: 31-Dec-1965     Patient Gender: M Patient Age:   28 years Exam Location:  [facility] Procedure:      VAS [place] ABI WITH/WO TBI Referring Phys: Gean Birchwood --------------------------------------------------------------------------------  Indications: Rest pain, and ulceration. High Risk Factors: Hypertension, hyperlipidemia, Diabetes.  Limitations: Today's exam was limited due to unable to obtain right great toe              pressure due to open ulceration. Comparison Study: no prior Performing Technologist: Archie Patten RVS  Examination Guidelines: A complete evaluation includes at minimum, Doppler waveform signals and systolic blood pressure reading at the level of  bilateral brachial, anterior tibial, and posterior tibial arteries, when vessel segments are accessible. Bilateral testing is considered an integral part of a complete examination. Photoelectric Plethysmograph (PPG) waveforms and toe systolic pressure readings are included as required and additional duplex testing as needed. Limited examinations for reoccurring indications may be performed as noted.  ABI Findings: +---------+------------------+-----+---------+---------------------------------+ Right    Rt Pressure (mmHg)IndexWaveform Comment                           +---------+------------------+-----+---------+---------------------------------+ Brachial 103                    triphasic                                  +---------+------------------+-----+---------+---------------------------------+ PTA      102               0.96 triphasic                                  +---------+------------------+-----+---------+---------------------------------+ DP       99                0.93 biphasic                                   +---------+------------------+-----+---------+---------------------------------+ Great Toe                                unable to obtain right great toe                                           pressure due to ulceration        +---------+------------------+-----+---------+---------------------------------+ +---------+------------------+-----+---------+-------+ Left     Lt Pressure (mmHg)IndexWaveform Comment +---------+------------------+-----+---------+-------+ Brachial 106                    triphasic        +---------+------------------+-----+---------+-------+ PTA      122  1.15 triphasic        +---------+------------------+-----+---------+-------+ DP       124               1.17 triphasic        +---------+------------------+-----+---------+-------+ Great Toe86                0.81 Normal            +---------+------------------+-----+---------+-------+  Summary: Right: Resting right ankle-brachial index is within normal range. Unable to obtain right great toe pressure due to ulceration. Left: Resting left ankle-brachial index is within normal range. The left toe-brachial index is normal. *See table(s) above for measurements and observations.  Electronically signed by Deitra Mayo MD on 10/16/2021 at 4:50:36 PM.    Final    MR FOOT RIGHT WO CONTRAST  Result Date: 10/16/2021 CLINICAL DATA:  Foot swelling, diabetic, osteomyelitis suspected EXAM: MRI OF THE RIGHT FOREFOOT WITHOUT CONTRAST TECHNIQUE: Multiplanar, multisequence MR imaging of the right forefoot was performed. No intravenous contrast was administered. COMPARISON:  X-ray 10/15/2021 FINDINGS: Bones/Joint/Cartilage No acute fracture. No dislocation. No bone marrow edema or marrow replacement. No erosion. No significant arthropathy. Trace first MTP joint effusion, nonspecific. Ligaments Intact Lisfranc ligament.  Intact collateral ligaments. Muscles and Tendons Diffuse edema-like signal throughout the foot musculature, likely reflecting denervation changes. Intact flexor and extensor tendons without tendinosis, tear, or tenosynovitis. Soft tissues Skin thickening with superficial wound or ulceration along the plantar aspect of the distal forefoot near the first MTP joint level. Generalized soft tissue edema. No organized fluid collection. IMPRESSION: 1. No acute osteomyelitis of the right forefoot. 2. Skin thickening with superficial wound or ulceration along the plantar aspect of the distal forefoot near the first MTP joint level. Generalized soft tissue edema without organized fluid collection. Electronically Signed   By: Davina Poke D.O.   On: 10/16/2021 08:11   DG Foot Complete Right  Result Date: 10/15/2021 CLINICAL DATA:  Right foot wound EXAM: RIGHT FOOT COMPLETE - 3+ VIEW COMPARISON:  None Available. FINDINGS: No fracture or  dislocation is seen. The joint spaces are preserved. Soft tissue lucency along the plantar aspect of the distal 1st digit near the DIP joint may correspond to the patient's known soft tissue ulcer. No associated cortical destruction to suggest osteomyelitis. IMPRESSION: No radiographic findings of osteomyelitis. Electronically Signed   By: Julian Hy M.D.   On: 10/15/2021 18:18    Microbiology: Recent Results (from the past 240 hour(s))  Culture, blood (Routine x 2)     Status: None (Preliminary result)   Collection Time: 10/15/21  6:00 PM   Specimen: BLOOD RIGHT ARM  Result Value Ref Range Status   Specimen Description BLOOD RIGHT ARM  Final   Special Requests   Final    BOTTLES DRAWN AEROBIC AND ANAEROBIC Blood Culture adequate volume   Culture   Final    NO GROWTH 2 DAYS Performed at [facility], [place] [address], [place], [place] [address]    Report Status PENDING  Incomplete  Culture, blood (Routine x 2)     Status: None (Preliminary result)   Collection Time: 10/15/21  6:15 PM   Specimen: BLOOD  Result Value Ref Range Status   Specimen Description BLOOD BLOOD RIGHT HAND  Final   Special Requests   Final    BOTTLES DRAWN AEROBIC AND ANAEROBIC Blood Culture adequate volume   Culture   Final    NO GROWTH 2 DAYS Performed at [facility],  [address] [place], [place] [address]    Report Status PENDING  Incomplete     Labs: Basic Metabolic Panel: Recent Labs  Lab 10/15/21 1808 10/16/21 0249  NA 135 138  K 3.4* 3.8  CL 98 100  CO2 23 24  GLUCOSE 243* 170*  BUN 20 21*  CREATININE 1.82* 1.73*  CALCIUM 8.9 8.8*   Liver Function Tests: Recent Labs  Lab 10/15/21 1808  AST 17  ALT 19  ALKPHOS 50  BILITOT 0.5  PROT 9.1*  ALBUMIN 3.4*   No results for input(s): "LIPASE", "AMYLASE" in the last 168 hours. No results for input(s): "AMMONIA" in the last 168 hours. CBC: Recent Labs  Lab 10/15/21 1808 10/16/21 0249  WBC 10.1 8.6  NEUTROABS 6.2   --   HGB 13.0 12.5*  HCT 39.7 38.5*  MCV 76.5* 77.2*  PLT 280 244   Cardiac Enzymes: No results for input(s): "CKTOTAL", "CKMB", "CKMBINDEX", "TROPONINI" in the last 168 hours. BNP: BNP (last 3 results) No results for input(s): "BNP" in the last 8760 hours.  ProBNP (last 3 results) No results for input(s): "PROBNP" in the last 8760 hours.  CBG: Recent Labs  Lab 10/16/21 0739 10/16/21 1150 10/16/21 1641 10/16/21 2000 10/17/21 0801  GLUCAP 164* 160* 208* 191* 114*       Signed:  Nita Sells MD   [facility] 10/17/2021, 8:43 AM

## 2021-10-17 NOTE — TOC Transition Note (Signed)
Transition of Care [facility]) - CM/SW Discharge Note   Patient Details  Name: Bruce Cabrera MRN: [id number] Date of Birth: 10-02-1965  [facility] of [facility]) CM/SW Contact:  Carles Collet, RN Phone Number: 10/17/2021, 9:54 AM   Clinical Narrative:     Discussed DC plan with patient at bedside. HH RN through [facility] will visit in 2-3 days. Patent aware that he will need to change dressing on days that nurse is not there.  Discussed DME needs, cane will be delivered to room, and shower stool will be delivered to his home. RN to complete education with patient on dressing changes and wound care. Patient has car here and wants to drive himself home. No other TOC needs identified for DC  Final next level of care: Home w Home Health Services Barriers to Discharge: No Barriers Identified   Patient Goals and CMS Choice Patient states their goals for this hospitalization and ongoing recovery are:: to go home CMS Medicare.gov Compare Post Acute Care list provided to:: Patient Choice offered to / list presented to : Patient  Discharge Placement                       Discharge Plan and Services                DME Arranged: Kasandra Knudsen, Shower stool DME Agency: [employer] Date DME Agency Contacted: 10/17/21 Time DME Agency Contacted: [phone] Representative spoke with at DME Agency: [facility] HH Arranged: RN [place] Agency: [place] Date [place]: 10/17/21 Time [place]: [phone] Representative spoke with at [place]: [place] Determinants of Health ([place]) Interventions     Readmission Risk Interventions     No data to display

## 2021-10-17 NOTE — Progress Notes (Signed)
Patient discharged to home with instructions and his cane, dressing change teaching done, sent a few dressing supplies, all questions answered.

## 2021-10-17 NOTE — Progress Notes (Signed)
Discharge instructions reviewed and read to him. Copy of instructions given to pt, informed his scripts sent to his pharmacy.  Discussed wound care in general, also importance of smoking cessation--resource number on d/c instructions.  Unit RN to review wound care with pt again, change his dressing.  Pt not appropriate for discharge lounge due to wound.  DME to be delivered to pt room. Pt states his car is here and will drive self home.

## 2021-10-17 NOTE — Progress Notes (Signed)
Mobility Specialist Progress Note:   10/17/21 1100  Mobility  Activity Ambulated with assistance in hallway  Level of Assistance Contact guard assist, steadying assist  Assistive Device Cane  Distance Ambulated (ft) 200 ft  Activity Response Tolerated well  $Mobility charge 1 Mobility   Pt received in bed and agreeable. No complaints. Pt left in bed with all needs met and call bell in reach.   Bruce Cabrera Mobility Specialist-Acute Rehab Secure Chat only

## 2021-10-17 NOTE — Progress Notes (Signed)
Subjective:  Patient ID: Bruce Cabrera, male    DOB: 05/31/1965,  MRN: [id number]   Patient seen at bedside this morning with right foot wound. Relates he is doing well. No pain. No n/v/f/c.        Past Medical History:  Diagnosis Date   Diabetes mellitus without complication ([place])     Hyperlipidemia associated with type 2 diabetes mellitus ([place])      based on 2011 profile   Hypertension             Past Surgical History:  Procedure Laterality Date   FOOT SURGERY Left     LEFT HEART CATHETERIZATION WITH CORONARY ANGIOGRAM N/A 11/29/2013    Procedure: LEFT HEART CATHETERIZATION WITH CORONARY ANGIOGRAM;  Surgeon: Troy Sine, MD;  Location: [facility] CATH LAB;  Service: Cardiovascular;  Laterality: N/A;   None              Latest Ref Rng & Units 10/16/2021    2:49 AM 10/15/2021    6:08 PM 05/16/2020    9:05 AM  CBC  WBC 4.0 - 10.5 K/uL 8.6  10.1  5.8   Hemoglobin 13.0 - 17.0 g/dL 12.5  13.0  17.7   Hematocrit 39.0 - 52.0 % 38.5  39.7  53.1   Platelets 150 - 400 K/uL 244  280  196           Latest Ref Rng & Units 10/16/2021    2:49 AM 10/15/2021    6:08 PM 12/12/2020   10:15 AM  BMP  Glucose 70 - 99 mg/dL 170  243  161   BUN 6 - 20 mg/dL '21  20  21   '$ Creatinine 0.61 - 1.24 mg/dL 1.73  1.82  1.53   BUN/Creat Ratio 9 - 20     14   Sodium 135 - 145 mmol/L 138  135  139   Potassium 3.5 - 5.1 mmol/L 3.8  3.4  4.3   Chloride 98 - 111 mmol/L 100  98  101   CO2 22 - 32 mmol/L '24  23  23   '$ Calcium 8.9 - 10.3 mg/dL 8.8  8.9  9.5         Objective:        Vitals:    10/16/21 0246 10/16/21 0500  BP: 111/63 118/68  Pulse: 98 67  Resp: 17 20  Temp:   98.2 F (36.8 C)  SpO2: 98% 98%      General:AA&O x 3. Normal mood and affect    Vascular: DP and PT pulses 2/4 bilateral. Brisk capillary refill to all digits. Pedal hair present    Neruological. Epicritic sensation grossly intact.    Derm: Right plantar foot wound with granular fibrotic base measuring 5 cm x 3 cm x 0.2 cm  . No erythema or edema present. No purulence. No probe to bone. Interspaces with some maceration noted.    MSK: MMT 5/5 in dorsiflexion, plantar flexion, inversion and eversion. Normal joint ROM without pain or crepitus.    MRI right foot:    IMPRESSION: 1. No acute osteomyelitis of the right forefoot. 2. Skin thickening with superficial wound or ulceration along the plantar aspect of the distal forefoot near the first MTP joint level. Generalized soft tissue edema without organized fluid collection.    ABI:    Summary:  Right: Resting right ankle-brachial index is within normal range.   Unable to obtain right great toe pressure due to ulceration.  Left: Resting  left ankle-brachial index is within normal range. The left  toe-brachial index is normal.    Assessment & Plan:  Patient was evaluated and treated and all questions answered.   DX: Diabetic right foot ulcer with cellulitis Wound care: betadine, DSD  Antibiotics: Per primary  DME: post-op shoe  Discussed with patient diagnosis and treatment options.  Imaging reviewed. MRI negative for any osteomyeltis or abscess. ABIs normal range.  Discussed with patient treatments. At this time no need for surgical intervention. He can continue with outpatient wound care and two weeks of oral antibiotics. He will follow-up in our clinic in one week from discharge.  Patient in agreement with plan and all questions answered.  Podiatry to sign off.    Lorenda Peck, DPM

## 2021-10-18 ENCOUNTER — Telehealth: Payer: Self-pay

## 2021-10-18 DIAGNOSIS — E11621 Type 2 diabetes mellitus with foot ulcer: Secondary | ICD-10-CM | POA: Diagnosis not present

## 2021-10-18 LAB — WOUND CULTURE
MICRO NUMBER:: 13840373
SPECIMEN QUALITY:: ADEQUATE

## 2021-10-18 NOTE — Telephone Encounter (Signed)
Transition Care Management Follow-up Telephone Call Date of discharge and from where: 10/17/2021, [facility] How have you been since you were released from the hospital? He said so far, so good. Any questions or concerns? No  Items Reviewed: Did the pt receive and understand the discharge instructions provided? Yes  Medications obtained and verified? Yes - he said he has all of his medications as well as a working glucometer.  Blood sugar this morning: 125. Other? No  Any new allergies since your discharge? No  Dietary orders reviewed? No Do you have support at home?  He said he is alone  Home Care and Equipment/Supplies: Were home health services ordered? yes If so, what is the name of the agency? [facility]  Has the agency set up a time to come to the patient's home? no Were any new equipment or medical supplies ordered?  Yes: cane, shower stool  What is the name of the medical supply agency? [facility] Were you able to get the supplies/equipment? He has the cane and is waiting for delivery of the shower chair Do you have any questions related to the use of the equipment or supplies? No  Functional Questionnaire: (I = Independent and D = Dependent) ADLs: has cane to use with ambulation, independent with personal care.  He will be doing his wound care the days that he is not seen by the home health nurse. He received some supplies when he left the hospital    Follow up appointments reviewed:  PCP Hospital f/u appt confirmed? Yes  Scheduled to see Dr Joya Gaskins- 10/29/2021.  [facility] f/u appt confirmed? Yes  Scheduled to see podiatry- 10/23/2021.   Are transportation arrangements needed? No  If their condition worsens, is the pt aware to call PCP or go to the Emergency Dept.? Yes Was the patient provided with contact information for the PCP's office or ED? Yes Was to pt encouraged to call back with questions or concerns? Yes

## 2021-10-19 DIAGNOSIS — E11621 Type 2 diabetes mellitus with foot ulcer: Secondary | ICD-10-CM | POA: Diagnosis not present

## 2021-10-20 LAB — CULTURE, BLOOD (ROUTINE X 2)
Culture: NO GROWTH
Culture: NO GROWTH
Special Requests: ADEQUATE
Special Requests: ADEQUATE

## 2021-10-23 ENCOUNTER — Encounter: Payer: Self-pay | Admitting: Podiatry

## 2021-10-23 ENCOUNTER — Ambulatory Visit (INDEPENDENT_AMBULATORY_CARE_PROVIDER_SITE_OTHER): Payer: BC Managed Care – PPO | Admitting: Podiatry

## 2021-10-23 ENCOUNTER — Telehealth: Payer: Self-pay

## 2021-10-23 DIAGNOSIS — E0843 Diabetes mellitus due to underlying condition with diabetic autonomic (poly)neuropathy: Secondary | ICD-10-CM

## 2021-10-23 DIAGNOSIS — L97512 Non-pressure chronic ulcer of other part of right foot with fat layer exposed: Secondary | ICD-10-CM

## 2021-10-23 NOTE — Progress Notes (Signed)
  Subjective:  Patient ID: Bruce Cabrera, male    DOB: 12-25-1965,   MRN: [id number]  Chief Complaint  Patient presents with   Diabetic Ulcer    Hospital follow up. Patient states that he went to the hospital due a lesion on the bottom of his foot. He states that he had x-rays taken and was informed that his infection has not reached his bone. He states that he was given antibiotics. 1/10 pain level. Patient states that he has minor drainage that consists of pus and Blood.     56 y.o. male presents for follow-up from hospital of right foot wound. Originally seen by Dr. Amalia Hailey who sent to hospital for worsening infection of wound. Relates he is doing better and pain resolved mostly. Has been dressing as instructed and taking antibiotics.  . Denies any other pedal complaints. Denies n/v/f/c.   Past Medical History:  Diagnosis Date   Diabetes mellitus without complication ([place])    Hyperlipidemia associated with type 2 diabetes mellitus ([place])    based on 2011 profile   Hypertension     Objective:  Physical Exam: Vascular: DP/PT pulses 2/4 bilateral. CFT <3 seconds. Normal hair growth on digits. No edema.  Skin. No lacerations or abrasions bilateral feet. Plantar right hallux wound about 4 cm x 3 cm x 0.2 cm with granular base. No erythema edema or purulence noted.  Musculoskeletal: MMT 5/5 bilateral lower extremities in DF, PF, Inversion and Eversion. Deceased ROM in DF of ankle joint.  Neurological: Sensation intact to light touch.   Assessment:  No diagnosis found.   Plan:  Patient was evaluated and treated and all questions answered. Ulcer right plantar hallux with fat layer exposed  -Debridement as below. -Dressed with betadine, DSD. -Off-loading with surgical shoe. -Cotninue with antibioitcs   -Discussed glucose control and proper protein-rich diet.  -Discussed if any worsening redness, pain, fever or chills to call or may need to report to the emergency room. Patient expressed  understanding.   Procedure: Excisional Debridement of Wound Rationale: Removal of non-viable soft tissue from the wound to promote healing.  Anesthesia: none Pre-Debridement Wound Measurements: Callus and skin slough  Post-Debridement Wound Measurements: 4 cm x 3 cm x 0.2 cm  Type of Debridement: Sharp Excisional Tissue Removed: Non-viable soft tissue Depth of Debridement: subcutaneous tissue. Technique: Sharp excisional debridement to bleeding, viable wound base.  Dressing: Dry, sterile, compression dressing. Disposition: Patient tolerated procedure well. Patient to return in 2 week for follow-up.  Return in about 2 weeks (around 11/06/2021) for wound check.   Lorenda Peck, DPM

## 2021-10-23 NOTE — Telephone Encounter (Signed)
   Telephone encounter was:  Successful.  10/23/2021 Name: Bruce Cabrera MRN: [id number] DOB: [birth year]  Bruce Cabrera is a 56 y.o. year old male who is a primary care patient of Elsie Stain, MD . The community resource team was consulted for assistance with  follow up visit  Care guide performed the following interventions: Patient provided with information about care guide support team and interviewed to confirm resource needs.Patient asked to assist to call and find out about a follow up visit. I called [facility] and the nurse will be out today to see him. She will call before going out 9/5 Follow Up Plan:  No further follow up planned at this time. The patient has been provided with needed resources.    [place], Care Management  [phone] [address], [place], [place] [address] Phone: [phone] Email: [email or web address]

## 2021-10-27 DIAGNOSIS — E1121 Type 2 diabetes mellitus with diabetic nephropathy: Secondary | ICD-10-CM | POA: Diagnosis not present

## 2021-10-27 DIAGNOSIS — Z794 Long term (current) use of insulin: Secondary | ICD-10-CM | POA: Diagnosis not present

## 2021-10-27 DIAGNOSIS — Z7984 Long term (current) use of oral hypoglycemic drugs: Secondary | ICD-10-CM | POA: Diagnosis not present

## 2021-10-27 DIAGNOSIS — Z9181 History of falling: Secondary | ICD-10-CM | POA: Diagnosis not present

## 2021-10-27 DIAGNOSIS — F17210 Nicotine dependence, cigarettes, uncomplicated: Secondary | ICD-10-CM | POA: Diagnosis not present

## 2021-10-27 DIAGNOSIS — L03115 Cellulitis of right lower limb: Secondary | ICD-10-CM | POA: Diagnosis not present

## 2021-10-27 DIAGNOSIS — I10 Essential (primary) hypertension: Secondary | ICD-10-CM | POA: Diagnosis not present

## 2021-10-27 DIAGNOSIS — E785 Hyperlipidemia, unspecified: Secondary | ICD-10-CM | POA: Diagnosis not present

## 2021-10-27 DIAGNOSIS — E11621 Type 2 diabetes mellitus with foot ulcer: Secondary | ICD-10-CM | POA: Diagnosis not present

## 2021-10-27 DIAGNOSIS — L97511 Non-pressure chronic ulcer of other part of right foot limited to breakdown of skin: Secondary | ICD-10-CM | POA: Diagnosis not present

## 2021-10-28 NOTE — Progress Notes (Signed)
Established Patient Office Visit  Subjective   Patient ID: Bruce Cabrera, male    DOB: August 29, 1965  Age: 56 y.o. MRN: [id number]  No chief complaint on file.   06/2021 This is a 56 year old male with type 2 diabetes last seen October 2022.  On arrival today blood pressure is 130/81 and A1c 10.6 blood sugar 270  The patient has not been using Trulicity or Toujeo because he has fear of needles.  He ran out of metformin has been out of medications for some time.  The patient's not been following a healthy diet as well.  Patient has been taking his medications for blood pressure.  Patient complains of some dryness in the mouth.  He has no other complaints  patient is followed now by podiatry for diabetic foot ulcer on the left foot  7/23  Patient seen in return follow-up and he had difficulty with abdominal pain on Trulicity so this had been stopped.  Patient is taking the Toujeo with smaller needles and tolerating well at 15 units daily.  On arrival blood sugar is quite elevated still.  Blood pressure however is at goal 120/76.  9/11   Cardiovascular and Mediastinum  Essential hypertension   Blood pressure at goal no changes made     Endocrine  Hyperlipidemia due to type 2 diabetes mellitus (HCC)   Continue statins    Relevant Medications  insulin glargine, 1 Unit Dial, (TOUJEO) 300 UNIT/ML Solostar Pen  dapagliflozin propanediol (FARXIGA) 10 MG TABS tablet  Diabetes mellitus ([place]) - Primary   We will trial Farxiga 10 mg daily increase Toujeo to 20 units daily discontinue further Trulicity    Relevant Medications  insulin glargine, 1 Unit Dial, (TOUJEO) 300 UNIT/ML Solostar Pen  dapagliflozin propanediol (FARXIGA) 10 MG TABS tablet  Other Relevant Orders  POCT glucose (manual entry) (Completed)   Other  Tobacco use       Current smoking consumption amount: Half pack daily   Dicsussion on advise to quit smoking and smoking impacts: Cardiovascular  impacts   Patient's willingness to quit: Not ready to yet quit   Methods to quit smoking discussed: Behavioral modification nicotine replacement   Medication management of smoking session drugs discussed: Nicotine replacement   Resources provided:  AVS    Setting quit date not established   Follow-up arranged 2 months   Time spent counseling the patient: 5 minutes    35 minutes spent with patient  Return in about 6 weeks (around 10/22/2021) for Dr Joya Gaskins 6 weeks [facility] CPP in three weeks DM f/u .      Patient Active Problem List   Diagnosis Date Noted  . Diabetic foot ulcer ([place]) 10/15/2021  . Tobacco use 08/23/2020  . Right hip pain 08/23/2020  . Polycythemia 12/26/2015  . Diabetes mellitus ([place]) 11/25/2013  . Hyperlipidemia due to type 2 diabetes mellitus ([place]) 10/13/2006  . Essential hypertension 10/13/2006  . OBESITY 10/10/2006   Past Medical History:  Diagnosis Date  . Diabetes mellitus without complication ([place])   . Hyperlipidemia associated with type 2 diabetes mellitus ([place])    based on 2011 profile  . Hypertension    Past Surgical History:  Procedure Laterality Date  . FOOT SURGERY Left   . LEFT HEART CATHETERIZATION WITH CORONARY ANGIOGRAM N/A 11/29/2013   Procedure: LEFT HEART CATHETERIZATION WITH CORONARY ANGIOGRAM;  Surgeon: Troy Sine, MD;  [facility] C[facility] LAB;  Service: Cardiovascular;  Laterality: N/A;  . None     Social History  Tobacco Use  . Smoking status: Every Day    Packs/day: 0.50    Years: 30.00    Total pack years: 15.00    Types: Cigarettes  . Smokeless tobacco: Never  Vaping Use  . Vaping Use: Never used  Substance Use Topics  . Alcohol use: Yes    Comment: 2- tallboys a day  . Drug use: No   Social History   Socioeconomic History  . Marital status: Single    Spouse name: Not on file  . Number of children: Not on file  . Years of education: Not on file  . Highest education level: Not on file  Occupational  History  . Occupation: Counsellor at [employer]  . Smoking status: Every Day    Packs/day: 0.50    Years: 30.00    Total pack years: 15.00    Types: Cigarettes  . Smokeless tobacco: Never  Vaping Use  . Vaping Use: Never used  Substance and Sexual Activity  . Alcohol use: Yes    Comment: 2- tallboys a day  . Drug use: No  . Sexual activity: Not on file  Other Topics Concern  . Not on file  Social History Narrative   Lives with roommate   Social Determinants of Health   Financial Resource Strain: Not on file  Food Insecurity: Not on file  Transportation Needs: Not on file  Physical Activity: Not on file  Stress: Not on file  Social Connections: Not on file  Intimate Partner Violence: Not on file   Family Status  Relation Name Status  . Mother  Deceased at age 49  . Father  Deceased       no info  . Brother  Alive  . Brother  (Not Specified)  . Sister  (Not Specified)   Family History  Problem Relation Age of Onset  . Heart attack Mother 20  . Heart failure Brother 40  . Stroke Sister       Review of Systems  Constitutional:  Negative for chills, diaphoresis, fever, malaise/fatigue and weight loss.  HENT:  Negative for congestion, hearing loss, nosebleeds, sore throat and tinnitus.        Dry mouth  Eyes:  Negative for blurred vision, photophobia and redness.  Respiratory:  Negative for cough, hemoptysis, sputum production, shortness of breath, wheezing and stridor.   Cardiovascular:  Negative for chest pain, palpitations, orthopnea, claudication, leg swelling and PND.  Gastrointestinal:  Positive for abdominal pain. Negative for blood in stool, constipation, diarrhea, heartburn, nausea and vomiting.  Genitourinary:  Negative for dysuria, flank pain, frequency, hematuria and urgency.  Musculoskeletal:  Negative for back pain, falls, joint pain, myalgias and neck pain.       Foot ulcer  Skin:  Negative for itching and rash.  Neurological:  Negative  for dizziness, tingling, tremors, sensory change, speech change, focal weakness, seizures, loss of consciousness, weakness and headaches.  Endo/Heme/Allergies:  Negative for environmental allergies and polydipsia. Does not bruise/bleed easily.  Psychiatric/Behavioral:  Negative for depression, memory loss, substance abuse and suicidal ideas. The patient is not nervous/anxious and does not have insomnia.       Objective:     There were no vitals taken for this visit. BP Readings from Last 3 Encounters:  10/17/21 109/67  09/10/21 120/76  07/23/21 130/81   Wt Readings from Last 3 Encounters:  10/16/21 260 lb 12.9 oz (118.3 kg)  09/10/21 275 lb 12.8 oz (125.1 kg)  07/23/21 275 lb  9.6 oz (125 kg)      Physical Exam Vitals reviewed.  Constitutional:      Appearance: Normal appearance. He is well-developed. He is obese. He is not diaphoretic.  HENT:     Head: Normocephalic and atraumatic.     Nose: No nasal deformity, septal deviation, mucosal edema or rhinorrhea.     Right Sinus: No maxillary sinus tenderness or frontal sinus tenderness.     Left Sinus: No maxillary sinus tenderness or frontal sinus tenderness.     Mouth/Throat:     Pharynx: No oropharyngeal exudate.  Eyes:     General: No scleral icterus.    Conjunctiva/sclera: Conjunctivae normal.     Pupils: Pupils are equal, round, and reactive to light.  Neck:     Thyroid: No thyromegaly.     Vascular: No carotid bruit or JVD.     Trachea: Trachea normal. No tracheal tenderness or tracheal deviation.  Cardiovascular:     Rate and Rhythm: Normal rate and regular rhythm.     Chest Wall: PMI is not displaced.     Pulses: Normal pulses. No decreased pulses.     Heart sounds: Normal heart sounds, S1 normal and S2 normal. Heart sounds not distant. No murmur heard.    No systolic murmur is present.     No diastolic murmur is present.     No friction rub. No gallop. No S3 or S4 sounds.  Pulmonary:     Effort: No tachypnea,  accessory muscle usage or respiratory distress.     Breath sounds: No stridor. No decreased breath sounds, wheezing, rhonchi or rales.  Chest:     Chest wall: No tenderness.  Abdominal:     General: Bowel sounds are normal. There is no distension.     Palpations: Abdomen is soft. Abdomen is not rigid.     Tenderness: There is no abdominal tenderness. There is no guarding or rebound.  Musculoskeletal:        General: Normal range of motion.     Cervical back: Normal range of motion and neck supple. No edema, erythema or rigidity. No muscular tenderness. Normal range of motion.  Lymphadenopathy:     Head:     Right side of head: No submental or submandibular adenopathy.     Left side of head: No submental or submandibular adenopathy.     Cervical: No cervical adenopathy.  Skin:    General: Skin is warm and dry.     Coloration: Skin is not pale.     Findings: No rash.     Nails: There is no clubbing.  Neurological:     Mental Status: He is alert and oriented to person, place, and time.     Sensory: No sensory deficit.  Psychiatric:        Speech: Speech normal.        Behavior: Behavior normal.     No results found for any visits on 10/29/21.   Last CBC Lab Results  Component Value Date   WBC 8.6 10/16/2021   HGB 12.5 (L) 10/16/2021   HCT 38.5 (L) 10/16/2021   MCV 77.2 (L) 10/16/2021   MCH 25.1 (L) 10/16/2021   RDW 14.2 10/16/2021   PLT 244 70/96/4383   Last metabolic panel Lab Results  Component Value Date   GLUCOSE 170 (H) 10/16/2021   NA 138 10/16/2021   K 3.8 10/16/2021   CL 100 10/16/2021   CO2 24 10/16/2021   BUN 21 (H) 10/16/2021  CREATININE 1.73 (H) 10/16/2021   EGFR 54 (L) 12/12/2020   CALCIUM 8.8 (L) 10/16/2021   PROT 9.1 (H) 10/15/2021   ALBUMIN 3.4 (L) 10/15/2021   LABGLOB 3.1 12/12/2020   AGRATIO 1.4 12/12/2020   BILITOT 0.5 10/15/2021   ALKPHOS 50 10/15/2021   AST 17 10/15/2021   ALT 19 10/15/2021   ANIONGAP 14 10/16/2021   Last  lipids Lab Results  Component Value Date   CHOL 132 12/12/2020   HDL 31 (L) 12/12/2020   LDLCALC 71 12/12/2020   TRIG 177 (H) 12/12/2020   CHOLHDL 4.3 12/12/2020   Last hemoglobin A1c Lab Results  Component Value Date   HGBA1C 10.6 (A) 07/23/2021   Last thyroid functions Lab Results  Component Value Date   TSH 1.520 10/11/2020   T4TOTAL 7.0 10/11/2020      The 10-year ASCVD risk score (Arnett DK, et al., 2019) is: 23.1%    Assessment & Plan:   Problem List Items Addressed This Visit   None 35 minutes spent with patient  No follow-ups on file.    Asencion Noble, MD

## 2021-10-29 ENCOUNTER — Encounter: Payer: Self-pay | Admitting: Critical Care Medicine

## 2021-10-29 ENCOUNTER — Ambulatory Visit: Payer: BC Managed Care – PPO | Attending: Critical Care Medicine | Admitting: Critical Care Medicine

## 2021-10-29 ENCOUNTER — Other Ambulatory Visit: Payer: Self-pay

## 2021-10-29 VITALS — BP 127/78 | HR 58 | Ht 75.0 in | Wt 265.2 lb

## 2021-10-29 DIAGNOSIS — Z720 Tobacco use: Secondary | ICD-10-CM

## 2021-10-29 DIAGNOSIS — E08621 Diabetes mellitus due to underlying condition with foot ulcer: Secondary | ICD-10-CM

## 2021-10-29 DIAGNOSIS — E11621 Type 2 diabetes mellitus with foot ulcer: Secondary | ICD-10-CM | POA: Diagnosis not present

## 2021-10-29 DIAGNOSIS — E1165 Type 2 diabetes mellitus with hyperglycemia: Secondary | ICD-10-CM

## 2021-10-29 DIAGNOSIS — Z794 Long term (current) use of insulin: Secondary | ICD-10-CM

## 2021-10-29 DIAGNOSIS — I10 Essential (primary) hypertension: Secondary | ICD-10-CM

## 2021-10-29 DIAGNOSIS — E1169 Type 2 diabetes mellitus with other specified complication: Secondary | ICD-10-CM | POA: Diagnosis not present

## 2021-10-29 DIAGNOSIS — F17211 Nicotine dependence, cigarettes, in remission: Secondary | ICD-10-CM

## 2021-10-29 DIAGNOSIS — L97512 Non-pressure chronic ulcer of other part of right foot with fat layer exposed: Secondary | ICD-10-CM

## 2021-10-29 DIAGNOSIS — E785 Hyperlipidemia, unspecified: Secondary | ICD-10-CM | POA: Diagnosis not present

## 2021-10-29 DIAGNOSIS — L97401 Non-pressure chronic ulcer of unspecified heel and midfoot limited to breakdown of skin: Secondary | ICD-10-CM

## 2021-10-29 DIAGNOSIS — Z7984 Long term (current) use of oral hypoglycemic drugs: Secondary | ICD-10-CM

## 2021-10-29 LAB — POCT GLYCOSYLATED HEMOGLOBIN (HGB A1C): HbA1c, POC (controlled diabetic range): 8.8 % — AB (ref 0.0–7.0)

## 2021-10-29 LAB — GLUCOSE, POCT (MANUAL RESULT ENTRY): POC Glucose: 97 mg/dl (ref 70–99)

## 2021-10-29 MED ORDER — METFORMIN HCL 1000 MG PO TABS
1000.0000 mg | ORAL_TABLET | Freq: Two times a day (BID) | ORAL | 3 refills | Status: DC
Start: 2021-10-29 — End: 2021-12-13
  Filled 2021-10-29 – 2021-12-12 (×2): qty 180, 90d supply, fill #0

## 2021-10-29 MED ORDER — ATORVASTATIN CALCIUM 40 MG PO TABS
40.0000 mg | ORAL_TABLET | Freq: Every day | ORAL | 3 refills | Status: DC
Start: 2021-10-29 — End: 2022-02-27
  Filled 2021-10-29: qty 30, 30d supply, fill #0
  Filled 2021-12-03 (×2): qty 30, 30d supply, fill #1
  Filled 2022-01-06: qty 30, 30d supply, fill #2
  Filled 2022-01-30: qty 30, 30d supply, fill #3

## 2021-10-29 MED ORDER — ACCU-CHEK SOFTCLIX LANCET DEV KIT
PACK | 0 refills | Status: DC
  Filled 2021-10-29: qty 1, 28d supply, fill #0

## 2021-10-29 MED ORDER — INSULIN GLARGINE (1 UNIT DIAL) 300 UNIT/ML ~~LOC~~ SOPN
30.0000 [IU] | PEN_INJECTOR | Freq: Every day | SUBCUTANEOUS | 4 refills | Status: DC
  Filled 2021-10-29: qty 3, fill #0
  Filled 2021-11-04: qty 4.5, 45d supply, fill #0
  Filled 2022-01-01: qty 4.5, 45d supply, fill #1
  Filled 2022-03-11: qty 3, 30d supply, fill #2
  Filled 2022-03-11: qty 4.5, 45d supply, fill #2
  Filled 2022-03-15 (×3): qty 3, 30d supply, fill #2
  Filled 2022-03-15 (×2): qty 1.5, 15d supply, fill #2
  Filled 2022-04-12: qty 3, 30d supply, fill #3

## 2021-10-29 NOTE — Assessment & Plan Note (Signed)
Continue with statins.  ?

## 2021-10-29 NOTE — Assessment & Plan Note (Signed)
Congratulated patient's on smoking cessation

## 2021-10-29 NOTE — Assessment & Plan Note (Signed)
Will increase Toujeo to 30 units daily and continue metformin and Farxiga as prescribed

## 2021-10-29 NOTE — Assessment & Plan Note (Signed)
Improved with wound care and antibiotics follow-up per podiatry

## 2021-10-29 NOTE — Assessment & Plan Note (Signed)
Blood pressure is at goal plan to continue with metoprolol daily no other changes made

## 2021-10-29 NOTE — Patient Instructions (Signed)
Increase Toujeo insulin to 30 units daily  No other medication changes  A1c was down to 8.8 please continue to work on your diet and reduce the alcohol intake I recommend a different type of alcohol instead of vodka and grapefruit suggest a light beer and maybe only 1 daily instead of 3 daily  Congratulations on smoking cessation  Keep your follow-up visits with the podiatrist  You declined the flu vaccine  Labs today include urine for microalbumin and metabolic panel blood count  Return to see Dr. Joya Gaskins in 4 months and see Lurena Joiner our clinical pharmacist for your diabetes in 1 month

## 2021-10-30 DIAGNOSIS — Z794 Long term (current) use of insulin: Secondary | ICD-10-CM | POA: Diagnosis not present

## 2021-10-30 DIAGNOSIS — F17210 Nicotine dependence, cigarettes, uncomplicated: Secondary | ICD-10-CM | POA: Diagnosis not present

## 2021-10-30 DIAGNOSIS — E1121 Type 2 diabetes mellitus with diabetic nephropathy: Secondary | ICD-10-CM | POA: Diagnosis not present

## 2021-10-30 DIAGNOSIS — E785 Hyperlipidemia, unspecified: Secondary | ICD-10-CM | POA: Diagnosis not present

## 2021-10-30 DIAGNOSIS — L97511 Non-pressure chronic ulcer of other part of right foot limited to breakdown of skin: Secondary | ICD-10-CM | POA: Diagnosis not present

## 2021-10-30 DIAGNOSIS — Z9181 History of falling: Secondary | ICD-10-CM | POA: Diagnosis not present

## 2021-10-30 DIAGNOSIS — I10 Essential (primary) hypertension: Secondary | ICD-10-CM | POA: Diagnosis not present

## 2021-10-30 DIAGNOSIS — L03115 Cellulitis of right lower limb: Secondary | ICD-10-CM | POA: Diagnosis not present

## 2021-10-30 DIAGNOSIS — Z7984 Long term (current) use of oral hypoglycemic drugs: Secondary | ICD-10-CM | POA: Diagnosis not present

## 2021-10-30 DIAGNOSIS — E11621 Type 2 diabetes mellitus with foot ulcer: Secondary | ICD-10-CM | POA: Diagnosis not present

## 2021-10-30 LAB — COMPREHENSIVE METABOLIC PANEL
ALT: 19 IU/L (ref 0–44)
AST: 13 IU/L (ref 0–40)
Albumin/Globulin Ratio: 1.3 (ref 1.2–2.2)
Albumin: 4.3 g/dL (ref 3.8–4.9)
Alkaline Phosphatase: 43 IU/L — ABNORMAL LOW (ref 44–121)
BUN/Creatinine Ratio: 13 (ref 9–20)
BUN: 17 mg/dL (ref 6–24)
Bilirubin Total: 0.3 mg/dL (ref 0.0–1.2)
CO2: 20 mmol/L (ref 20–29)
Calcium: 9.3 mg/dL (ref 8.7–10.2)
Chloride: 106 mmol/L (ref 96–106)
Creatinine, Ser: 1.32 mg/dL — ABNORMAL HIGH (ref 0.76–1.27)
Globulin, Total: 3.2 g/dL (ref 1.5–4.5)
Glucose: 100 mg/dL — ABNORMAL HIGH (ref 70–99)
Potassium: 4.6 mmol/L (ref 3.5–5.2)
Sodium: 141 mmol/L (ref 134–144)
Total Protein: 7.5 g/dL (ref 6.0–8.5)
eGFR: 64 mL/min/{1.73_m2} (ref >59)

## 2021-10-30 LAB — CBC WITH DIFFERENTIAL/PLATELET
Basophils Absolute: 0 10*3/uL (ref 0.0–0.2)
Basos: 0 %
EOS (ABSOLUTE): 0.2 10*3/uL (ref 0.0–0.4)
Eos: 3 %
Hematocrit: 36.1 % — ABNORMAL LOW (ref 37.5–51.0)
Hemoglobin: 12.3 g/dL — ABNORMAL LOW (ref 13.0–17.7)
Immature Grans (Abs): 0 10*3/uL (ref 0.0–0.1)
Immature Granulocytes: 0 %
Lymphocytes Absolute: 4.2 10*3/uL — ABNORMAL HIGH (ref 0.7–3.1)
Lymphs: 48 %
MCH: 25.2 pg — ABNORMAL LOW (ref 26.6–33.0)
MCHC: 34.1 g/dL (ref 31.5–35.7)
MCV: 74 fL — ABNORMAL LOW (ref 79–97)
Monocytes Absolute: 0.6 10*3/uL (ref 0.1–0.9)
Monocytes: 7 %
Neutrophils Absolute: 3.6 10*3/uL (ref 1.4–7.0)
Neutrophils: 42 %
Platelets: 376 10*3/uL (ref 150–450)
RBC: 4.88 x10E6/uL (ref 4.14–5.80)
RDW: 14.3 % (ref 11.6–15.4)
WBC: 8.6 10*3/uL (ref 3.4–10.8)

## 2021-10-30 LAB — MICROALBUMIN / CREATININE URINE RATIO
Creatinine, Urine: 128.3 mg/dL
Microalb/Creat Ratio: 101 mg/g creat — ABNORMAL HIGH (ref 0–29)
Microalbumin, Urine: 130.1 ug/mL

## 2021-10-30 NOTE — Progress Notes (Signed)
Let pt know protein in urine is IMPROVED!  Due to improved glucose control  all labs improved continue medicaitons as prescribed

## 2021-11-02 ENCOUNTER — Telehealth: Payer: Self-pay

## 2021-11-02 NOTE — Telephone Encounter (Signed)
-----   Message from Elsie Stain, MD sent at 10/30/2021  5:05 PM EDT ----- Let pt know protein in urine is IMPROVED!  Due to improved glucose control  all labs improved continue medicaitons as prescribed

## 2021-11-02 NOTE — Telephone Encounter (Signed)
Pt was called and is aware of results, DOB was confirmed.  ?

## 2021-11-05 ENCOUNTER — Ambulatory Visit (INDEPENDENT_AMBULATORY_CARE_PROVIDER_SITE_OTHER): Payer: BC Managed Care – PPO | Admitting: Podiatry

## 2021-11-05 ENCOUNTER — Other Ambulatory Visit: Payer: Self-pay

## 2021-11-05 ENCOUNTER — Encounter: Payer: Self-pay | Admitting: Podiatry

## 2021-11-05 DIAGNOSIS — E0843 Diabetes mellitus due to underlying condition with diabetic autonomic (poly)neuropathy: Secondary | ICD-10-CM

## 2021-11-05 DIAGNOSIS — L97512 Non-pressure chronic ulcer of other part of right foot with fat layer exposed: Secondary | ICD-10-CM

## 2021-11-05 DIAGNOSIS — M79674 Pain in right toe(s): Secondary | ICD-10-CM

## 2021-11-05 DIAGNOSIS — B351 Tinea unguium: Secondary | ICD-10-CM

## 2021-11-05 DIAGNOSIS — M79675 Pain in left toe(s): Secondary | ICD-10-CM | POA: Diagnosis not present

## 2021-11-05 NOTE — Progress Notes (Signed)
  Subjective:  Patient ID: Bruce Cabrera, male    DOB: 09/17/1965,   MRN: [id number]  Chief Complaint  Patient presents with   Wound Check      2 wk follow up for wound care    56 y.o. male presents for follow-up  of right foot wound.  Relates he is doing better and pain resolved mostly. Has been dressing as instructed and taking antibiotics.  Requesting to have nails trimmed today. Denies any other pedal complaints. Denies n/v/f/c. Patient is diabetic and last A1c was 8.8.  PCP: Asencion Noble MD   Past Medical History:  Diagnosis Date   Diabetes mellitus without complication ([place])    Hyperlipidemia associated with type 2 diabetes mellitus ([place])    based on 2011 profile   Hypertension     Objective:  Physical Exam: Vascular: DP/PT pulses 2/4 bilateral. CFT <3 seconds. Normal hair growth on digits. No edema.  Skin. No lacerations or abrasions bilateral feet. Plantar right hallux wound about 0.5 cm x 1 cm x 0.1 cm with granular base. No erythema edema or purulence noted. Nials 1-5 b/l are thickened elongated and with subungual debris.  Musculoskeletal: MMT 5/5 bilateral lower extremities in DF, PF, Inversion and Eversion. Deceased ROM in DF of ankle joint.  Neurological: Sensation intact to light touch.   Assessment:   1. Ulcer of right foot with fat layer exposed ([place])   2. Diabetes mellitus due to underlying condition with diabetic autonomic neuropathy, unspecified whether long term insulin use ([place])      Plan:  Patient was evaluated and treated and all questions answered. Ulcer right plantar hallux limited to breakdown of skin  -Debridement as below. -Dressed with betadine, DSD. -Off-loading with surgical shoe. -Cotninue with antibioitcs   -Discussed glucose control and proper protein-rich diet.  -Discussed if any worsening redness, pain, fever or chills to call or may need to report to the emergency room. Patient expressed understanding.  -Nails 1-5 b/l debrided without  incident with nail nippers.   Procedure: Excisional Debridement of Wound Rationale: Removal of non-viable soft tissue from the wound to promote healing.  Anesthesia: none Pre-Debridement Wound Measurements: Callus and skin slough  Post-Debridement Wound Measurements: 0.5 cm x 1 cm x 0.1 cm  Type of Debridement: Sharp Excisional Tissue Removed: Non-viable soft tissue Depth of Debridement: subcutaneous tissue. Technique: Sharp excisional debridement to bleeding, viable wound base.  Dressing: Dry, sterile, compression dressing. Disposition: Patient tolerated procedure well. Patient to return in 2 week for follow-up.  No follow-ups on file.   Lorenda Peck, DPM

## 2021-11-06 ENCOUNTER — Ambulatory Visit: Payer: BC Managed Care – PPO | Attending: Critical Care Medicine | Admitting: Pharmacist

## 2021-11-06 DIAGNOSIS — E1165 Type 2 diabetes mellitus with hyperglycemia: Secondary | ICD-10-CM | POA: Diagnosis not present

## 2021-11-06 NOTE — Progress Notes (Signed)
S:     No chief complaint on file.  Bruce Cabrera is a 56 y.o. male who presents for diabetes evaluation, education, and management.  PMH is significant for HTN, T2DM, HLD, obesity.  Patient was referred and last seen by Primary Care Provider, Dr. Joya Gaskins, on 10/29/2021. A1c at that appointment indicated improvement but was still above goal. Toujeo dose was increased to 30u daily.   Today, patient arrives in good spirits and presents without any assistance.   Patient reports Diabetes is longstanding. He is doing better since his last visit with me. He has achieved complete tobacco cessation and BP is better controlled. A1c is improving and his recent foot infection continues to heal well.   Family/Social History:  Fhx: MI, CHF, stroke Tobacco: stopped smoking several weeks ago. Alcohol: none reported   Current diabetes medications include: Farxiga 10 mg daily, metformin 1000 mg BID, Toujeo 30u once daily  Patient reports adherence to taking all medications as prescribed.   Insurance coverage: BCBS   Patient denies hypoglycemic events.  Reported home fasting blood sugars: not checking. Brings his meter in today for education. Reported 2 hour post-meal/random blood sugars: not checking  Patient denies nocturia (nighttime urination).  Patient reports neuropathy (nerve pain). Patient denies visual changes. Sees an Eye doctor yearly.  Patient reports self foot exams.   Patient reported dietary habits:  -Pt admits to dietary indiscretion -Continues to struggle with this.   Patient-reported exercise habits:  -No formal regimen outside of work.   O:   ROS  Physical Exam  7 day average blood glucose: brings in meter for education but does not know how to use the lancing device.  Lab Results  Component Value Date   HGBA1C 8.8 (A) 10/29/2021   There were no vitals filed for this visit.  Lipid Panel     Component Value Date/Time   CHOL 132 12/12/2020 1015   TRIG 177  (H) 12/12/2020 1015   HDL 31 (L) 12/12/2020 1015   CHOLHDL 4.3 12/12/2020 1015   CHOLHDL 7.1 11/25/2013 1639   VLDL 20 11/25/2013 1639   LDLCALC 71 12/12/2020 1015    Clinical Atherosclerotic Cardiovascular Disease (ASCVD): No  The 10-year ASCVD risk score (Arnett DK, et al., 2019) is: 18.4%   Values used to calculate the score:     Age: 63 years     Sex: Male     Is Non-Hispanic African American: Yes     Diabetic: Yes     Tobacco smoker: No     Systolic Blood Pressure: 950 mmHg     Is BP treated: Yes     HDL Cholesterol: 31 mg/dL     Total Cholesterol: 132 mg/dL   A/P: Diabetes longstanding currently uncontrolled but A1c is improving. Home CBG data unknown. Encouraged pt to resume checking CBGs at home. Patient is able to verbalize appropriate hypoglycemia management plan. Medication adherence looks great!  -Continued Toujeo 30u daily for now. Cannot adjust his dose until we get some home readings.  -Continue Farxiga, metformin at current doses.  -Extensively discussed pathophysiology of diabetes, recommended lifestyle interventions, dietary effects on blood sugar control.  -Counseled on s/sx of and management of hypoglycemia.  -Next A1c anticipated 01/2022.   Written patient instructions provided. Patient verbalized understanding of treatment plan.  Total time in face to face counseling 30 minutes.    Follow-up:  Pharmacist visit in 1 month.  Benard Halsted, PharmD, BCACP, [facility] [phone]

## 2021-11-07 DIAGNOSIS — F17210 Nicotine dependence, cigarettes, uncomplicated: Secondary | ICD-10-CM | POA: Diagnosis not present

## 2021-11-07 DIAGNOSIS — E11621 Type 2 diabetes mellitus with foot ulcer: Secondary | ICD-10-CM | POA: Diagnosis not present

## 2021-11-07 DIAGNOSIS — I10 Essential (primary) hypertension: Secondary | ICD-10-CM | POA: Diagnosis not present

## 2021-11-07 DIAGNOSIS — Z7984 Long term (current) use of oral hypoglycemic drugs: Secondary | ICD-10-CM | POA: Diagnosis not present

## 2021-11-07 DIAGNOSIS — E1121 Type 2 diabetes mellitus with diabetic nephropathy: Secondary | ICD-10-CM | POA: Diagnosis not present

## 2021-11-07 DIAGNOSIS — L03115 Cellulitis of right lower limb: Secondary | ICD-10-CM | POA: Diagnosis not present

## 2021-11-07 DIAGNOSIS — E785 Hyperlipidemia, unspecified: Secondary | ICD-10-CM | POA: Diagnosis not present

## 2021-11-07 DIAGNOSIS — Z794 Long term (current) use of insulin: Secondary | ICD-10-CM | POA: Diagnosis not present

## 2021-11-07 DIAGNOSIS — Z9181 History of falling: Secondary | ICD-10-CM | POA: Diagnosis not present

## 2021-11-07 DIAGNOSIS — L97511 Non-pressure chronic ulcer of other part of right foot limited to breakdown of skin: Secondary | ICD-10-CM | POA: Diagnosis not present

## 2021-11-09 ENCOUNTER — Other Ambulatory Visit: Payer: Self-pay

## 2021-11-13 ENCOUNTER — Ambulatory Visit: Payer: BC Managed Care – PPO | Admitting: Critical Care Medicine

## 2021-11-19 ENCOUNTER — Ambulatory Visit (INDEPENDENT_AMBULATORY_CARE_PROVIDER_SITE_OTHER): Payer: BC Managed Care – PPO | Admitting: Podiatry

## 2021-11-19 ENCOUNTER — Other Ambulatory Visit: Payer: Self-pay

## 2021-11-19 ENCOUNTER — Encounter: Payer: Self-pay | Admitting: Podiatry

## 2021-11-19 DIAGNOSIS — L97512 Non-pressure chronic ulcer of other part of right foot with fat layer exposed: Secondary | ICD-10-CM

## 2021-11-20 ENCOUNTER — Other Ambulatory Visit: Payer: Self-pay

## 2021-11-20 DIAGNOSIS — L03115 Cellulitis of right lower limb: Secondary | ICD-10-CM | POA: Diagnosis not present

## 2021-11-20 DIAGNOSIS — I10 Essential (primary) hypertension: Secondary | ICD-10-CM | POA: Diagnosis not present

## 2021-11-20 DIAGNOSIS — E785 Hyperlipidemia, unspecified: Secondary | ICD-10-CM | POA: Diagnosis not present

## 2021-11-20 DIAGNOSIS — E11621 Type 2 diabetes mellitus with foot ulcer: Secondary | ICD-10-CM | POA: Diagnosis not present

## 2021-11-20 DIAGNOSIS — L97511 Non-pressure chronic ulcer of other part of right foot limited to breakdown of skin: Secondary | ICD-10-CM | POA: Diagnosis not present

## 2021-11-20 DIAGNOSIS — F17210 Nicotine dependence, cigarettes, uncomplicated: Secondary | ICD-10-CM | POA: Diagnosis not present

## 2021-11-20 DIAGNOSIS — Z7984 Long term (current) use of oral hypoglycemic drugs: Secondary | ICD-10-CM | POA: Diagnosis not present

## 2021-11-20 DIAGNOSIS — Z9181 History of falling: Secondary | ICD-10-CM | POA: Diagnosis not present

## 2021-11-20 DIAGNOSIS — E1121 Type 2 diabetes mellitus with diabetic nephropathy: Secondary | ICD-10-CM | POA: Diagnosis not present

## 2021-11-20 DIAGNOSIS — Z794 Long term (current) use of insulin: Secondary | ICD-10-CM | POA: Diagnosis not present

## 2021-11-20 NOTE — Progress Notes (Signed)
   Chief Complaint  Patient presents with   Foot Ulcer    Follow up ulcer plantar foot right   "I think its looking better"    Subjective:  56 y.o. male with PMHx of diabetes mellitus, uncontrolled, last A1c 07/23/2021 was 10.6 presenting for follow-up evaluation of a ulcer to the plantar aspect of the right foot.  Patient states there is been significant improvement.  He has been applying the antibiotic cream and he completed the oral medication as prescribed.  No new complaints at this time   Past Medical History:  Diagnosis Date   Diabetes mellitus without complication ([place])    Hyperlipidemia associated with type 2 diabetes mellitus ([place])    based on 2011 profile   Hypertension     Past Surgical History:  Procedure Laterality Date   FOOT SURGERY Left    LEFT HEART CATHETERIZATION WITH CORONARY ANGIOGRAM N/A 11/29/2013   Procedure: LEFT HEART CATHETERIZATION WITH CORONARY ANGIOGRAM;  Surgeon: Troy Sine, MD;  Location: [facility] CATH LAB;  Service: Cardiovascular;  Laterality: N/A;   None      Allergies  Allergen Reactions   Trulicity [Dulaglutide] Other (See Comments)    Abdominal pain       Objective/Physical Exam General: The patient is alert and oriented x3 in no acute distress.  Dermatology:  Wounds noted to the plantar aspect of the right foot appear mostly healed.  Significant improvement since last visit.  There is some hyperkeratotic peeling skin but overall there is no open wound or appreciable drainage.  Please see above noted photo  Vascular: Skin is warm to touch.  Erythema resolved   Neurological: Light touch and protective threshold diminished bilaterally.   Musculoskeletal Exam: No prior amputations.  No pedal deformity noted Assessment: 1.  Diabetic foot ulcer complicated by acute onset of cellulitis/abscess right foot; resolved  Plan of Care:  1. Patient was evaluated. 2.  Light debridement of the hyperkeratotic skin was performed today using a tissue  nipper.  Again, no open wounds noted 3.  Continue antibiotic cream to moisturize the foot.  No bandage required 4.  Recommend good supportive shoes and sneakers.  Advised against going barefoot 5.  Return to clinic next scheduled appointment for routine foot care  Edrick Kins, DPM [facility]  Dr. Edrick Kins, DPM    [address], [place] [address]                Office [phone]  Fax [phone]

## 2021-11-21 ENCOUNTER — Other Ambulatory Visit: Payer: Self-pay

## 2021-12-03 ENCOUNTER — Other Ambulatory Visit: Payer: Self-pay

## 2021-12-03 ENCOUNTER — Other Ambulatory Visit (HOSPITAL_COMMUNITY): Payer: Self-pay

## 2021-12-12 ENCOUNTER — Other Ambulatory Visit: Payer: Self-pay

## 2021-12-12 NOTE — Progress Notes (Unsigned)
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  PMH is significant for HTN, HLD, T2DM, and obesity.  Patient was referred and last seen by Primary Care Provider, Dr. Joya Gaskins, on 10/29/21. A1c was improved at that visit but still above goal. Toujeo dose increased to 30u daily.  At last visit with clinical pharmacist, adherence looked good. Encouraged to bring BG readings to his next visit.    Today, patient arrives in good spirits and presents without any assistance. 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 He has started smoking once a week again, previously reported that he had quit. He reported that he has had diarrhea since initiation of metformin but recently read online that it could be d/t the metformin. We discussed this at length and methods to reduce GI upset.   Patient reports Diabetes is longstanding.   Family/Social History:  Fhx: MI, CHF, stroke Tobacco: once a week  Alcohol: none reported   Current diabetes medications include: Farxiga 10 mg daily, metformin 1000 mg BID, Toujeo 30u once daily  Patient reports adherence to taking all medications as prescribed.   Insurance coverage: BCBS  Patient reports hypoglycemic events.  Reported home fasting blood sugars: none reported  Reported 2 hour post-meal/random blood sugars: 120-130s (this is 1-1.5 hours after eating).  Patient denies nocturia (nighttime urination). (Not like he used to) Patient denies neuropathy (nerve pain). Patient denies visual changes. Patient reports self foot exams.   Patient reported dietary habits:  -Pt admits to dietary indiscretion -Continues to struggle with this.  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   O:    Lab Results  Component Value Date   HGBA1C 8.8  (A) 10/29/2021   There were no vitals filed for this visit.  Lipid Panel     Component Value Date/Time   CHOL 132 12/12/2020 1015   TRIG 177 (H) 12/12/2020 1015   HDL 31 (L) 12/12/2020 1015   CHOLHDL 4.3 12/12/2020 1015   CHOLHDL 7.1 11/25/2013 1639   VLDL 20 11/25/2013 1639   LDLCALC 71 12/12/2020 1015    Clinical Atherosclerotic Cardiovascular Disease (ASCVD): No  The 10-year ASCVD risk score (Arnett DK, et al., 2019) is: 18.4%   Values used to calculate the score:     Age: 51 years     Sex: Male     Is Non-Hispanic African American: Yes     Diabetic: Yes     Tobacco smoker: No     Systolic Blood Pressure: 595 mmHg     Is BP treated: Yes     HDL Cholesterol: 31 mg/dL     Total Cholesterol: 132 mg/dL   A/P: Diabetes longstanding currently close to goal based on home readings. Patient is able to verbalize appropriate hypoglycemia management plan. Medication adherence appears appropriate.  -Continued basal insulin Toujeo 30 units once daily  -Continued SGLT2-I Farxiga '10mg'$  once daily  - Changed  metformin '100mg'$  BID to the  XR formulation to help with GI SDE. Counseled to take this medication with his meals -Extensively discussed pathophysiology of diabetes, recommended lifestyle interventions, dietary effects on blood sugar control.  -Counseled on s/sx of and management of hypoglycemia.  -Counseled to take fasting BG readings -Next A1c anticipated December.  Written patient instructions provided. Patient verbalized understanding of treatment plan.  Total time in face to face counseling 20 minutes.    Follow-up:  Pharmacist in December. PCP clinic visit in January  Maryan Puls, PharmD PGY-1 [facility] Resident

## 2021-12-13 ENCOUNTER — Other Ambulatory Visit: Payer: Self-pay | Admitting: Pharmacist

## 2021-12-13 ENCOUNTER — Other Ambulatory Visit: Payer: Self-pay

## 2021-12-13 ENCOUNTER — Ambulatory Visit: Payer: BC Managed Care – PPO | Attending: Critical Care Medicine | Admitting: Pharmacist

## 2021-12-13 DIAGNOSIS — E1165 Type 2 diabetes mellitus with hyperglycemia: Secondary | ICD-10-CM

## 2021-12-13 MED ORDER — METFORMIN HCL ER 500 MG PO TB24
1000.0000 mg | ORAL_TABLET | Freq: Two times a day (BID) | ORAL | 2 refills | Status: DC
  Filled 2021-12-13: qty 120, 30d supply, fill #0
  Filled 2022-01-20: qty 120, 30d supply, fill #1

## 2021-12-14 LAB — CMP14+EGFR
ALT: 15 IU/L (ref 0–44)
AST: 16 IU/L (ref 0–40)
Albumin/Globulin Ratio: 1.5 (ref 1.2–2.2)
Albumin: 4.3 g/dL (ref 3.8–4.9)
Alkaline Phosphatase: 45 IU/L (ref 44–121)
BUN/Creatinine Ratio: 14 (ref 9–20)
BUN: 17 mg/dL (ref 6–24)
Bilirubin Total: 0.3 mg/dL (ref 0.0–1.2)
CO2: 23 mmol/L (ref 20–29)
Calcium: 9 mg/dL (ref 8.7–10.2)
Chloride: 105 mmol/L (ref 96–106)
Creatinine, Ser: 1.2 mg/dL (ref 0.76–1.27)
Globulin, Total: 2.8 g/dL (ref 1.5–4.5)
Glucose: 86 mg/dL (ref 70–99)
Potassium: 4.3 mmol/L (ref 3.5–5.2)
Sodium: 142 mmol/L (ref 134–144)
Total Protein: 7.1 g/dL (ref 6.0–8.5)
eGFR: 72 mL/min/{1.73_m2} (ref >59)

## 2021-12-24 ENCOUNTER — Other Ambulatory Visit: Payer: Self-pay

## 2021-12-26 ENCOUNTER — Other Ambulatory Visit: Payer: Self-pay

## 2021-12-31 ENCOUNTER — Ambulatory Visit (INDEPENDENT_AMBULATORY_CARE_PROVIDER_SITE_OTHER): Payer: BC Managed Care – PPO | Admitting: Podiatry

## 2021-12-31 DIAGNOSIS — L989 Disorder of the skin and subcutaneous tissue, unspecified: Secondary | ICD-10-CM

## 2021-12-31 NOTE — Progress Notes (Signed)
   Chief Complaint  Patient presents with   Follow-up    Patient is here for follow-up for left foot wound check.patient states that e has some pain sometimes.    Subjective: 56 y.o. male PMHx diabetes mellitus presenting to the office today for follow-up evaluation of a callus that developed to the plantar aspect of the left foot.  He does have history of a wound to this area.  He does not go barefoot.  He wears good supportive sneakers daily   Past Medical History:  Diagnosis Date   Diabetes mellitus without complication ([place])    Hyperlipidemia associated with type 2 diabetes mellitus ([place])    based on 2011 profile   Hypertension     Past Surgical History:  Procedure Laterality Date   FOOT SURGERY Left    LEFT HEART CATHETERIZATION WITH CORONARY ANGIOGRAM N/A 11/29/2013   Procedure: LEFT HEART CATHETERIZATION WITH CORONARY ANGIOGRAM;  Surgeon: Troy Sine, MD;  Location: [facility] CATH LAB;  Service: Cardiovascular;  Laterality: N/A;   None      Allergies  Allergen Reactions   Trulicity [Dulaglutide] Other (See Comments)    Abdominal pain     Objective:  Physical Exam General: Alert and oriented x3 in no acute distress  Dermatology: Hyperkeratotic lesion(s) present on the plantar aspect of the first MTP left. Pain on palpation with a central nucleated core noted. Skin is warm, dry and supple bilateral lower extremities. Negative for open lesions or macerations.  Vascular: Palpable pedal pulses bilaterally. No edema or erythema noted. Capillary refill within normal limits.  Neurological: Epicritic and protective threshold grossly intact bilaterally.   Musculoskeletal Exam: Pain on palpation at the keratotic lesion(s) noted. Range of motion within normal limits bilateral. Muscle strength 5/5 in all groups bilateral.  Assessment: 1.  Preulcerative callus plantar aspect of the first MTP left   Plan of Care:  1. Patient evaluated 2. Excisional debridement of keratoic  lesion(s) using a chisel blade was performed without incident.  3.  Recommend going to [employer] on [address]. for good supportive shoes with diabetic type insoles. 4.  Return to clinic in 2 months for routine diabetic foot care  Edrick Kins, DPM [facility]  Dr. Edrick Kins, DPM    [address], [place] [address]                Office [phone]  Fax [phone]

## 2022-01-01 ENCOUNTER — Other Ambulatory Visit: Payer: Self-pay

## 2022-01-02 ENCOUNTER — Other Ambulatory Visit: Payer: Self-pay

## 2022-01-07 ENCOUNTER — Other Ambulatory Visit: Payer: Self-pay

## 2022-01-11 IMAGING — CR DG CHEST 2V
2 series · 2 of 2 positions shown · non-contrast
Comparison: May 16, 2017

CLINICAL DATA: Chest pain

EXAM:
CHEST - 2 VIEW

[w chest pa]
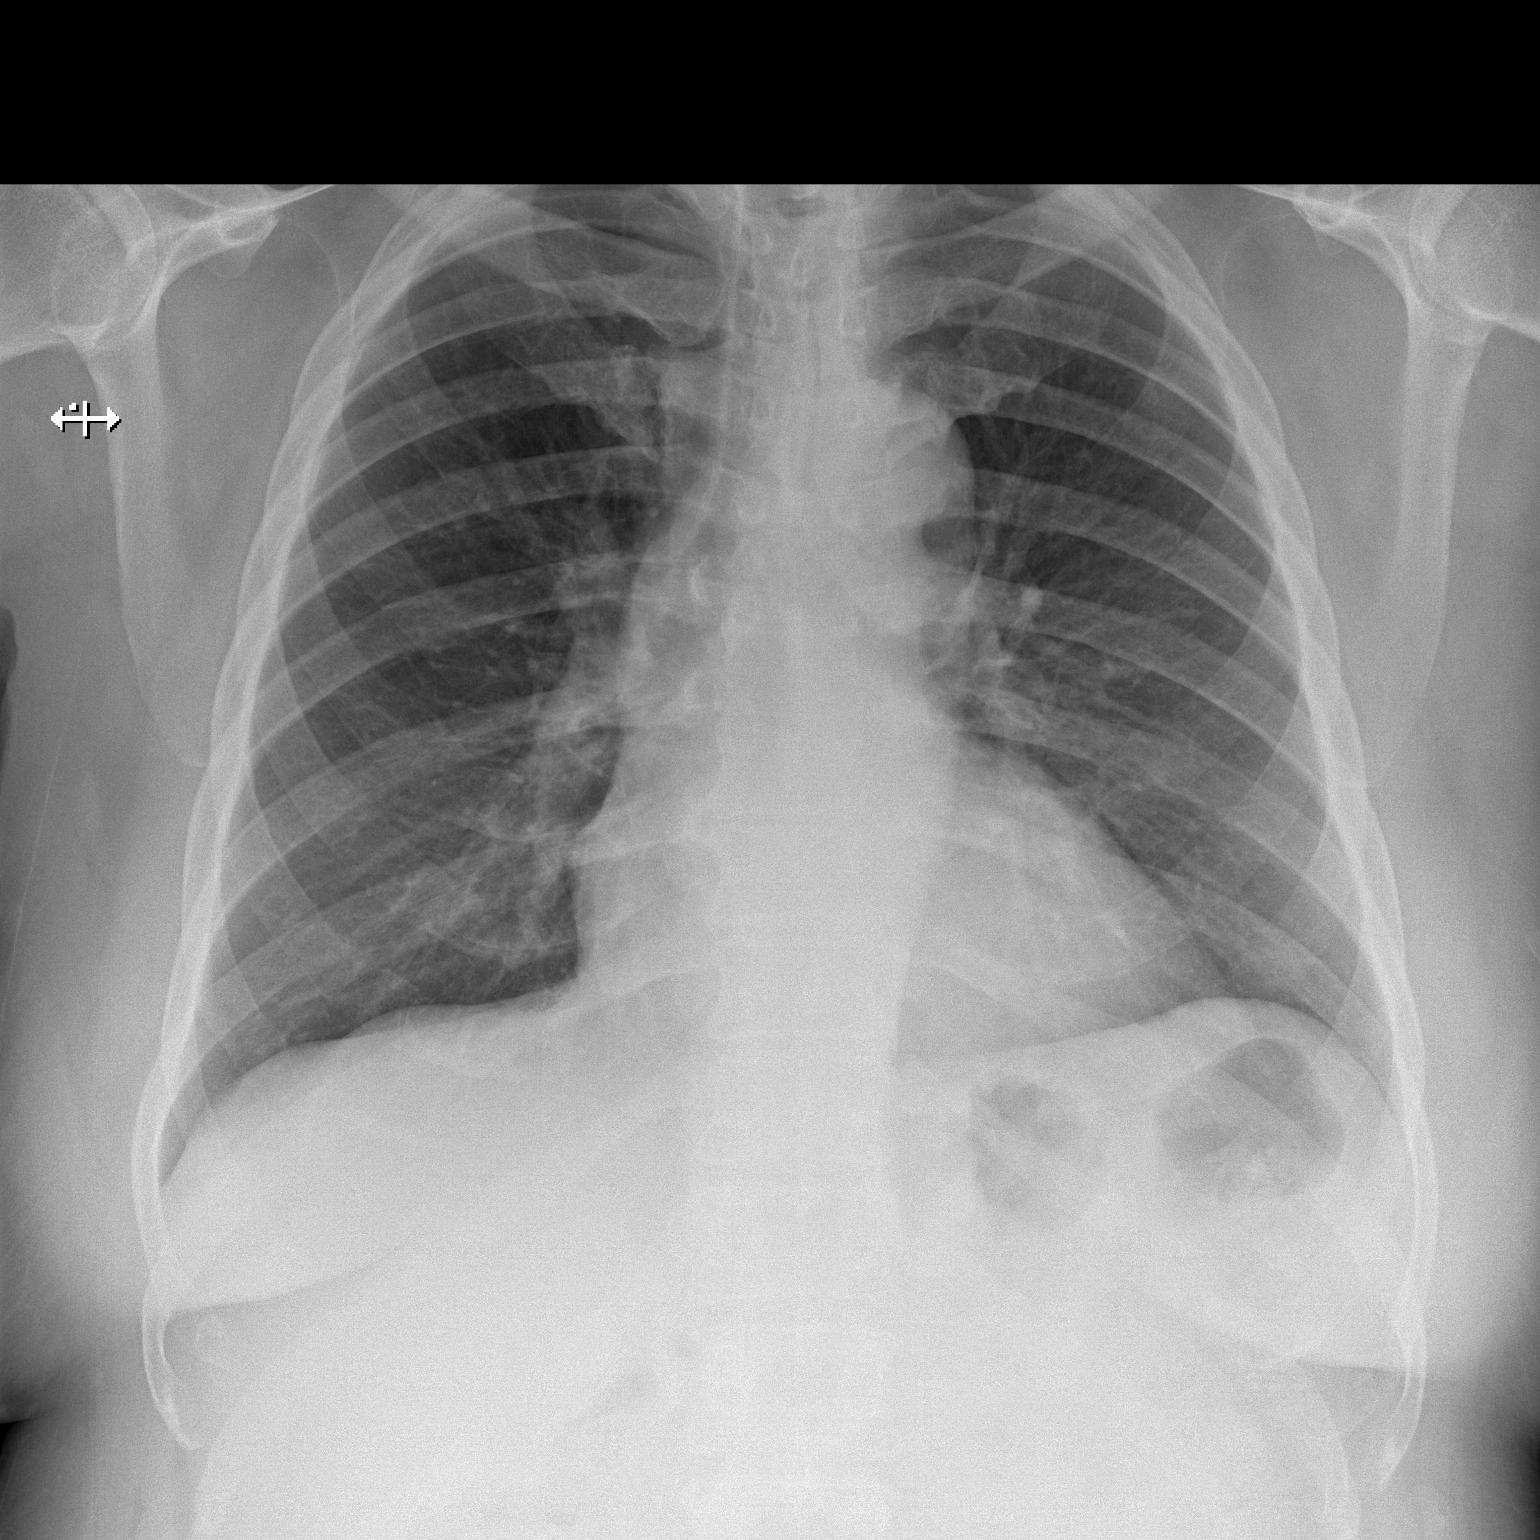

[w chest lat]
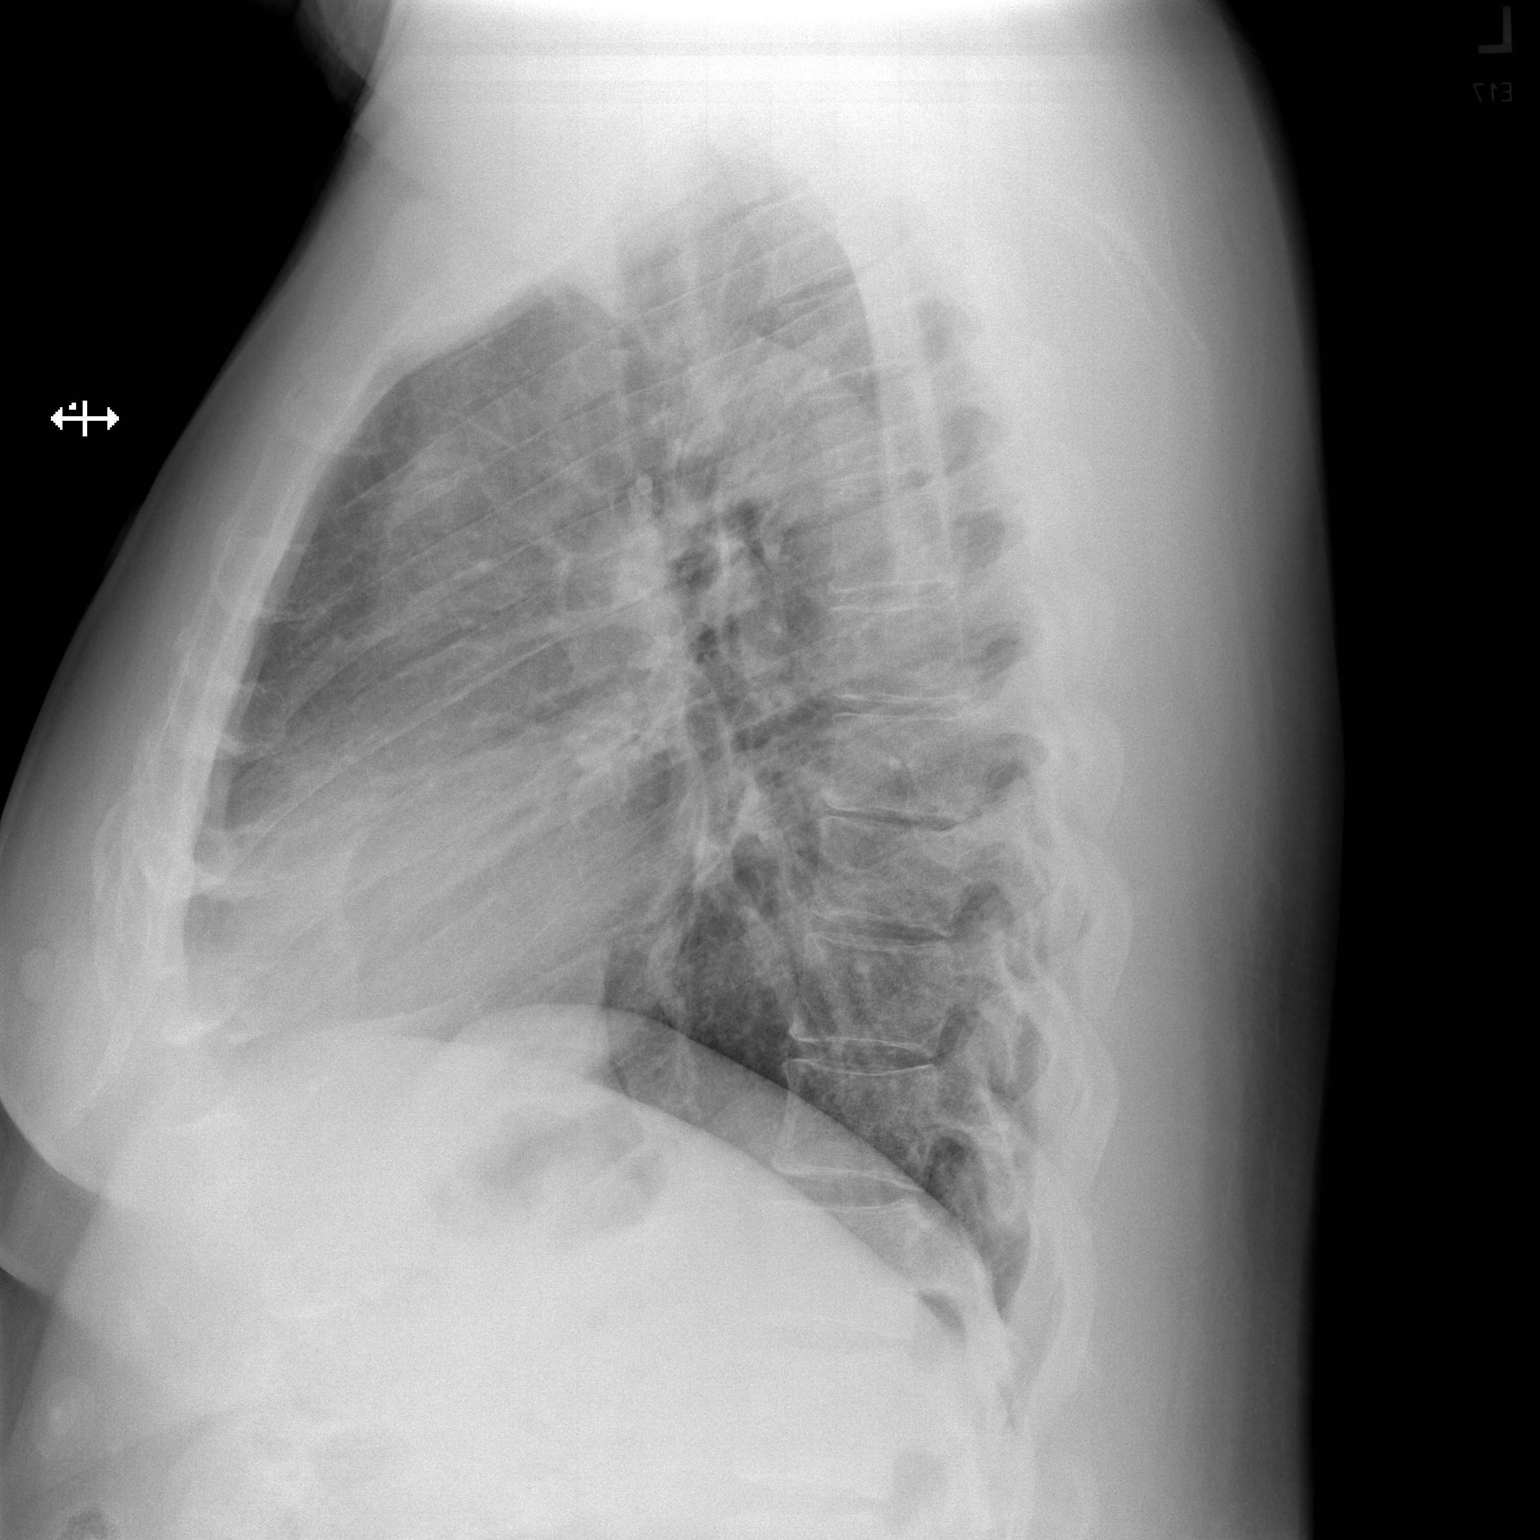

[2 of 2 positions shown; findings below may reference images not displayed]

FINDINGS: Lungs are clear. Heart size and pulmonary vascularity are normal. No
adenopathy. No pneumothorax. No bone lesions.
IMPRESSION: Lungs clear.  Cardiac silhouette normal.

## 2022-01-21 ENCOUNTER — Other Ambulatory Visit: Payer: Self-pay

## 2022-01-21 ENCOUNTER — Encounter: Payer: Self-pay | Admitting: Pharmacist

## 2022-01-21 ENCOUNTER — Ambulatory Visit: Payer: BC Managed Care – PPO | Attending: Critical Care Medicine | Admitting: Pharmacist

## 2022-01-21 DIAGNOSIS — E1165 Type 2 diabetes mellitus with hyperglycemia: Secondary | ICD-10-CM | POA: Diagnosis not present

## 2022-01-21 LAB — POCT GLYCOSYLATED HEMOGLOBIN (HGB A1C): HbA1c, POC (controlled diabetic range): 6.4 % (ref 0.0–7.0)

## 2022-01-21 NOTE — Progress Notes (Signed)
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  PMH is significant for HTN, HLD, T2DM, and obesity.  Patient was referred and last seen by Primary Care Provider, Dr. Joya Gaskins, on 10/29/21. Last seen by pharmacy clinic on 10/26/20203. At that visit, metformin was changed to XR due to GI side effects.   Today, patient arrives in good spirits and presents without any assistance. Endorses that GI side effects have not subsided since changing to metformin XR. He had side effects at both 500 mg BID and 1000 mg BID. A1c down to 6.4% today, from 8.8% in September. Of note, this is the lowest A1c he has had since September 2017 (peaked at 13.6% in August 2022).   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 He has started smoking once a week again, previously reported that he had quit.  Patient reports Diabetes is longstanding.   Family/Social History:  Fhx: MI, CHF, stroke Tobacco: once a week  Alcohol: none reported   Current diabetes medications include: Farxiga 10 mg daily, metformin XR 1000 mg BID, Toujeo 30 units once daily  Patient reports adherence to taking all medications as prescribed.   Insurance coverage: BCBS  Patient reports hypoglycemic events.  Reported home fasting blood sugars: none reported, hadnt been checking with the holidays 150-119, after thanksgiving some >200s. Works night shift.   Patient denies nocturia (nighttime urination).  Patient denies neuropathy (nerve pain). Patient denies visual changes. Patient reports self foot exams.   Patient reported dietary habits:  -Pt admits to dietary indiscretion -Continues to struggle with this.  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  O:    Lab Results  Component Value Date   HGBA1C 6.4 01/21/2022    There were no vitals filed for this visit.  Lipid Panel     Component Value Date/Time   CHOL 132 12/12/2020 1015   TRIG 177 (H) 12/12/2020 1015   HDL 31 (L) 12/12/2020 1015   CHOLHDL 4.3 12/12/2020 1015   CHOLHDL 7.1 11/25/2013 1639   VLDL 20 11/25/2013 1639   LDLCALC 71 12/12/2020 1015    Clinical Atherosclerotic Cardiovascular Disease (ASCVD): No  The 10-year ASCVD risk score (Arnett DK, et al., 2019) is: 19.2%   Values used to calculate the score:     Age: 73 years     Sex: Male     Is Non-Hispanic African American: Yes     Diabetic: Yes     Tobacco smoker: No     Systolic Blood Pressure: 542 mmHg     Is BP treated: Yes     HDL Cholesterol: 31 mg/dL     Total Cholesterol: 132 mg/dL   A/P: Diabetes longstanding currently at goal based on A1c (6.4%). Patient is able to verbalize appropriate hypoglycemia management plan. Medication adherence appears appropriate.  -Continued basal insulin Toujeo 30 units once daily  -Continued SGLT2-I Farxiga '10mg'$  once daily  -Discontinue metformin given recurrent GI side effects, even on XR formulation -Intolerant to [employer] -Extensively discussed pathophysiology of diabetes, recommended lifestyle interventions, dietary effects on blood sugar control.  -Counseled on s/sx of and management of hypoglycemia.  -Counseled to take fasting BG readings -Next A1c anticipated 3-6 months.   Written patient instructions provided. Patient verbalized understanding of treatment  plan.  Total time in face to face counseling 20 minutes.    Follow-up:  Pharmacist in [place] PCP clinic visit in January  Bruce Cabrera, [place].D. PGY-2 [facility] Resident 01/21/2022 1:57 PM

## 2022-01-30 ENCOUNTER — Other Ambulatory Visit: Payer: Self-pay

## 2022-02-27 ENCOUNTER — Other Ambulatory Visit: Payer: Self-pay

## 2022-02-27 ENCOUNTER — Encounter: Payer: Self-pay | Admitting: Physician Assistant

## 2022-02-27 ENCOUNTER — Ambulatory Visit: Payer: BC Managed Care – PPO | Attending: Critical Care Medicine | Admitting: Physician Assistant

## 2022-02-27 VITALS — BP 139/76 | HR 62 | Temp 98.4°F | Ht 75.0 in | Wt 270.0 lb

## 2022-02-27 DIAGNOSIS — E785 Hyperlipidemia, unspecified: Secondary | ICD-10-CM

## 2022-02-27 DIAGNOSIS — E1165 Type 2 diabetes mellitus with hyperglycemia: Secondary | ICD-10-CM | POA: Diagnosis not present

## 2022-02-27 DIAGNOSIS — R030 Elevated blood-pressure reading, without diagnosis of hypertension: Secondary | ICD-10-CM | POA: Diagnosis not present

## 2022-02-27 DIAGNOSIS — E1169 Type 2 diabetes mellitus with other specified complication: Secondary | ICD-10-CM

## 2022-02-27 LAB — GLUCOSE, POCT (MANUAL RESULT ENTRY): POC Glucose: 149 mg/dl — AB (ref 70–99)

## 2022-02-27 MED ORDER — DAPAGLIFLOZIN PROPANEDIOL 10 MG PO TABS
10.0000 mg | ORAL_TABLET | Freq: Every day | ORAL | 4 refills | Status: DC
  Filled 2022-02-27 – 2022-03-15 (×2): qty 30, 30d supply, fill #0
  Filled 2022-04-12: qty 30, 30d supply, fill #1

## 2022-02-27 MED ORDER — ATORVASTATIN CALCIUM 40 MG PO TABS
40.0000 mg | ORAL_TABLET | Freq: Every day | ORAL | 3 refills | Status: DC
  Filled 2022-02-27: qty 30, 30d supply, fill #0
  Filled 2022-04-12: qty 30, 30d supply, fill #1

## 2022-02-27 NOTE — Progress Notes (Signed)
Patient ID: Bruce Cabrera, male   DOB: 21-Dec-1965, 57 y.o.   MRN: [id number]   Bruce Cabrera, is a 57 y.o. male  KWI:[id number]  MEQ:[id number]  DOB - 05/24/1965  Chief Complaint  Patient presents with   Hypertension    HTN & DM f/u.  Not taking HTN med.        Subjective:   Bruce Cabrera is a 57 y.o. male here today for med RF.  Blood sugars running under 150 on 34 units toujeo daily.  Not on BP meds bc taken off several months ago. BP has been ok.  He has cut way back on smoking.  No new issues or concerns.  No problems updated.  ALLERGIES: Allergies  Allergen Reactions   Trulicity [Dulaglutide] Other (See Comments)    Abdominal pain    PAST MEDICAL HISTORY: Past Medical History:  Diagnosis Date   Diabetes mellitus without complication ([place])    Hyperlipidemia associated with type 2 diabetes mellitus ([place])    based on 2011 profile   Hypertension     MEDICATIONS AT HOME: Prior to Admission medications   Medication Sig Start Date End Date Taking? Authorizing Provider  Accu-Chek Softclix Lancets lancets Use as instructed 08/23/20  Yes Elsie Stain, MD  Blood Glucose Monitoring Suppl (ACCU-CHEK GUIDE) w/Device KIT Use to check blood sugar twice daily 08/23/20  Yes Elsie Stain, MD  glucose blood (ACCU-CHEK GUIDE) test strip Use as instructed 07/23/21  Yes Elsie Stain, MD  Insulin Pen Needle (TRUEPLUS 5-BEVEL PEN NEEDLES) 32G X 4 MM MISC Use to inject Toujeo once daily. 07/23/21  Yes Elsie Stain, MD  Lancets Misc. (ACCU-CHEK SOFTCLIX LANCET DEV) KIT use as directed 10/29/21  Yes Elsie Stain, MD  atorvastatin (LIPITOR) 40 MG tablet Take 1 tablet (40 mg total) by mouth daily. 02/27/22   Argentina Donovan, PA-C  dapagliflozin propanediol (FARXIGA) 10 MG TABS tablet Take 1 tablet (10 mg total) by mouth daily before breakfast. 02/27/22   Argentina Donovan, PA-C  insulin glargine, 1 Unit Dial, (TOUJEO) 300 UNIT/ML Solostar Pen Inject 30 Units into the skin at bedtime.  10/29/21 02/21/22  Elsie Stain, MD  nicotine polacrilex (NICORETTE MINI) 4 MG lozenge Take three times a day to quit smoking Patient not taking: Reported on 02/27/2022 12/12/20   Elsie Stain, MD    ROS: Neg HEENT Neg resp Neg cardiac Neg GI Neg GU Neg MS Neg psych Neg neuro  Objective:   Vitals:   02/27/22 0951  BP: 139/76  Pulse: 62  Temp: 98.4 F (36.9 C)  TempSrc: Oral  SpO2: 98%  Weight: 270 lb (122.5 kg)  Height: '6\' 3"'$  (1.905 m)   Exam General appearance : Awake, alert, not in any distress. Speech Clear. Not toxic looking HEENT: Atraumatic and Normocephalic Neck: Supple, no JVD. No cervical lymphadenopathy.  Chest: Good air entry bilaterally, CTAB.  No rales/rhonchi/wheezing CVS: S1 S2 regular, no murmurs.  Extremities: B/L Lower Ext shows no edema, both legs are warm to touch Neurology: Awake alert, and oriented X 3, CN II-XII intact, Non focal Skin: No Rash  Data Review Lab Results  Component Value Date   HGBA1C 6.4 01/21/2022   HGBA1C 8.8 (A) 10/29/2021   HGBA1C 10.6 (A) 07/23/2021    Assessment & Plan   1. Type 2 diabetes mellitus with hyperglycemia, unspecified whether long term insulin use (HCC) Has improved on toujeo and farxiga - Glucose (CBG) - dapagliflozin propanediol (FARXIGA) 10 MG TABS  tablet; Take 1 tablet (10 mg total) by mouth daily before breakfast.  Dispense: 30 tablet; Refill: 4  2. Hyperlipidemia due to type 2 diabetes mellitus (HCC) - atorvastatin (LIPITOR) 40 MG tablet; Take 1 tablet (40 mg total) by mouth daily.  Dispense: 30 tablet; Refill: 3  3. Elevated blood pressure reading Check blood pressure as often as you can.  The goal is for your blood pressure to be <130/<85.  Bring the readings to your next visit.    Return in about 10 weeks (around 05/08/2022) for PCP for chronic conditions and next A1C.  The patient was given clear instructions to go to ER or return to medical center if symptoms don't improve, worsen or  new problems develop. The patient verbalized understanding. The patient was told to call to get lab results if they haven't heard anything in the next week.      Freeman Caldron, PA-C [facility] [place], [place]   02/27/2022, 10:19 AM

## 2022-02-27 NOTE — Patient Instructions (Signed)
Check blood pressure as often as you can.  The goal is for your blood pressure to be <130/<85.  Bring the readings to your next visit.

## 2022-02-28 ENCOUNTER — Ambulatory Visit: Payer: BC Managed Care – PPO | Admitting: Critical Care Medicine

## 2022-02-28 ENCOUNTER — Other Ambulatory Visit: Payer: Self-pay

## 2022-02-28 ENCOUNTER — Other Ambulatory Visit (HOSPITAL_COMMUNITY): Payer: Self-pay

## 2022-03-05 ENCOUNTER — Other Ambulatory Visit: Payer: Self-pay

## 2022-03-08 ENCOUNTER — Ambulatory Visit (INDEPENDENT_AMBULATORY_CARE_PROVIDER_SITE_OTHER): Payer: BC Managed Care – PPO

## 2022-03-08 ENCOUNTER — Ambulatory Visit (INDEPENDENT_AMBULATORY_CARE_PROVIDER_SITE_OTHER): Payer: BC Managed Care – PPO | Admitting: Podiatry

## 2022-03-08 DIAGNOSIS — L97512 Non-pressure chronic ulcer of other part of right foot with fat layer exposed: Secondary | ICD-10-CM

## 2022-03-08 NOTE — Progress Notes (Signed)
Chief Complaint  Patient presents with   Diabetic Ulcer    Right foot diabetic ulcer follow-up, rate of pain 9 out 10 while walking, no drainage, Diabetic A1c- 6.0    Subjective: 57 y.o. male PMHx diabetes mellitus presenting to the office today for follow-up evaluation of a callus that developed to the plantar aspect of the right foot.  Please note correction that previous note stated that this was the left foot.  Patient has been dealing with a symptomatic callus that we ulcerates intermittently to the plantar aspect of the right first MTP for several years.  We have pursued multiple conservative treatment modalities.  Patient states that today it is very painful and tender.  Last seen in the office on 12/31/2021.  Patient gets very temporary relief with routine debridement.  He presents for further treatment and evaluation  Past Medical History:  Diagnosis Date   Diabetes mellitus without complication ([place])    Hyperlipidemia associated with type 2 diabetes mellitus ([place])    based on 2011 profile   Hypertension     Past Surgical History:  Procedure Laterality Date   FOOT SURGERY Left    LEFT HEART CATHETERIZATION WITH CORONARY ANGIOGRAM N/A 11/29/2013   Procedure: LEFT HEART CATHETERIZATION WITH CORONARY ANGIOGRAM;  Surgeon: Troy Sine, MD;  Location: [facility] CATH LAB;  Service: Cardiovascular;  Laterality: N/A;   None      Allergies  Allergen Reactions   Trulicity [Dulaglutide] Other (See Comments)    Abdominal pain     Objective:  Physical Exam General: Alert and oriented x3 in no acute distress  Dermatology: Hyperkeratotic preulcerative callus lesion noted to the plantar aspect of the first MTP right.  There is associated tenderness with palpation along the area as well.  Today there is no open wound but there is very thick hard callus development.  Vascular: Palpable pedal pulses bilaterally. No edema or erythema noted. Capillary refill within normal  limits.  Neurological: Epicritic and protective threshold grossly intact bilaterally.   Musculoskeletal Exam: Significant tenderness with palpation directly related to the fibular sesamoid of the right foot first MTP.  No pain with palpation to the tibial sesamoid  Radiographic exam RT foot 03/08/2022: Radiographic marker noted associated closely to the fibular sesamoid of the first MTP right foot.  No acute fractures identified.  Joint spaces mostly preserved.  MR FOOT RIGHT WO CONTRAST 10/15/2021: IMPRESSION: 1. No acute osteomyelitis of the right forefoot. 2. Skin thickening with superficial wound or ulceration along the plantar aspect of the distal forefoot near the first MTP joint level. Generalized soft tissue edema without organized fluid collection.  Assessment: 1.  Preulcerative callus plantar aspect of the first MTP right 2.  Chronic fibular sesamoiditis right   Plan of Care:  1. Patient evaluated 2. Excisional debridement of keratoic lesion(s) using a chisel blade was performed without incident.  3.  Unfortunate the patient has had these symptoms for several years despite routine conservative treatment modalities.  I do believe it is appropriate at this time to discuss surgery.  Surgery would consist of fibular sesamoidectomy.  This procedure was explained in detail to the patient.  No guarantees were expressed or implied.  Risk benefits advantages and disadvantages were also explained.  All patient questions answered.  I do believe this will help alleviate pressure from the area and potentially resolve the patient's recurrent preulcerative callus.  After discussion with the patient he would like to proceed with the surgery.  Patient's diabetes is currently  controlled 4.  Authorization for surgery was initiated today.  Surgery will consist of fibular sesamoidectomy right foot 5.  Return to clinic 1 week postop  Edrick Kins, DPM [facility]  Dr. Edrick Kins,  DPM    [address], [place] [address]                Office [phone]  Fax [phone]

## 2022-03-11 ENCOUNTER — Encounter: Payer: Self-pay | Admitting: Oncology

## 2022-03-11 ENCOUNTER — Other Ambulatory Visit: Payer: Self-pay

## 2022-03-15 ENCOUNTER — Encounter: Payer: Self-pay | Admitting: Oncology

## 2022-03-15 ENCOUNTER — Other Ambulatory Visit: Payer: Self-pay

## 2022-03-19 ENCOUNTER — Other Ambulatory Visit: Payer: Self-pay

## 2022-03-19 ENCOUNTER — Other Ambulatory Visit: Payer: Self-pay | Admitting: Critical Care Medicine

## 2022-03-19 NOTE — Telephone Encounter (Signed)
D/C 'd 02/27/2022 by Marga Melnick PA-C  Requested Prescriptions  Refused Prescriptions Disp Refills   metoprolol succinate (TOPROL-XL) 50 MG 24 hr tablet 90 tablet 3    Sig: Take 1 tablet (50 mg total) by mouth daily. Take with or immediately following a meal.     Cardiovascular:  Beta Blockers Passed - 03/19/2022  9:17 AM      Passed - Last BP in normal range    BP Readings from Last 1 Encounters:  02/27/22 139/76         Passed - Last Heart Rate in normal range    Pulse Readings from Last 1 Encounters:  02/27/22 62         Passed - Valid encounter within last 6 months    Recent Outpatient Visits           2 weeks ago Type 2 diabetes mellitus with hyperglycemia, unspecified whether long term insulin use [facility])   [facility] [place], [employer], [place]   1 month ago Type 2 diabetes mellitus with hyperglycemia, unspecified whether long term insulin use [facility])   [place], [place] L, RPH-CPP   3 months ago Type 2 diabetes mellitus with hyperglycemia, unspecified whether long term insulin use [facility]   [place], [place] L, RPH-CPP   4 months ago Type 2 diabetes mellitus with hyperglycemia, unspecified whether long term insulin use [facility])   [place], [place] L, RPH-CPP   4 months ago Type 2 diabetes mellitus with hyperglycemia, unspecified whether long term insulin use [facility])   [place] Elsie Stain, MD       Future Appointments             In 1 month Joya Gaskins Burnett Harry, MD [place]

## 2022-03-20 ENCOUNTER — Telehealth: Payer: Self-pay | Admitting: Podiatry

## 2022-03-20 ENCOUNTER — Other Ambulatory Visit: Payer: Self-pay

## 2022-03-20 NOTE — Telephone Encounter (Signed)
DOS: 04/11/2022  BCBS Effective 02/18/2022  Sesamoidectomy Rt (28315)  Deductible: $2,500 with $1,003 remaining Out-of-Pocket: $6,350 with $3,180 remaining CoInsurance: 20%  Prior authorization is not required per [place]. Call Reference #: [id number]

## 2022-04-08 ENCOUNTER — Telehealth: Payer: Self-pay

## 2022-04-08 NOTE — Telephone Encounter (Signed)
Received message from [facility]. Surgery with Dr. Amalia Hailey on 04/11/2022 has been cancelled by [place] due to a bad debit with them. Notified Dr. Amalia Hailey.

## 2022-04-12 ENCOUNTER — Other Ambulatory Visit: Payer: Self-pay

## 2022-04-15 ENCOUNTER — Other Ambulatory Visit: Payer: Self-pay

## 2022-04-16 ENCOUNTER — Other Ambulatory Visit: Payer: Self-pay

## 2022-04-17 ENCOUNTER — Encounter: Payer: BC Managed Care – PPO | Admitting: Podiatry

## 2022-04-18 ENCOUNTER — Other Ambulatory Visit: Payer: Self-pay

## 2022-04-24 ENCOUNTER — Other Ambulatory Visit: Payer: Self-pay

## 2022-04-24 ENCOUNTER — Encounter: Payer: BC Managed Care – PPO | Admitting: Podiatry

## 2022-04-29 ENCOUNTER — Other Ambulatory Visit: Payer: Self-pay

## 2022-04-29 ENCOUNTER — Encounter: Payer: Self-pay | Admitting: Oncology

## 2022-05-08 ENCOUNTER — Encounter: Payer: BC Managed Care – PPO | Admitting: Podiatry

## 2022-05-09 ENCOUNTER — Other Ambulatory Visit: Payer: Self-pay

## 2022-05-09 ENCOUNTER — Encounter: Payer: Self-pay | Admitting: Critical Care Medicine

## 2022-05-09 ENCOUNTER — Ambulatory Visit: Payer: BC Managed Care – PPO | Attending: Critical Care Medicine | Admitting: Critical Care Medicine

## 2022-05-09 ENCOUNTER — Other Ambulatory Visit (HOSPITAL_COMMUNITY): Payer: Self-pay

## 2022-05-09 VITALS — BP 153/88 | HR 80 | Ht 75.0 in | Wt 282.2 lb

## 2022-05-09 DIAGNOSIS — I10 Essential (primary) hypertension: Secondary | ICD-10-CM

## 2022-05-09 DIAGNOSIS — Z6835 Body mass index (BMI) 35.0-35.9, adult: Secondary | ICD-10-CM

## 2022-05-09 DIAGNOSIS — N186 End stage renal disease: Secondary | ICD-10-CM | POA: Insufficient documentation

## 2022-05-09 DIAGNOSIS — Z79899 Other long term (current) drug therapy: Secondary | ICD-10-CM | POA: Diagnosis not present

## 2022-05-09 DIAGNOSIS — E6609 Other obesity due to excess calories: Secondary | ICD-10-CM

## 2022-05-09 DIAGNOSIS — E1169 Type 2 diabetes mellitus with other specified complication: Secondary | ICD-10-CM

## 2022-05-09 DIAGNOSIS — F17210 Nicotine dependence, cigarettes, uncomplicated: Secondary | ICD-10-CM | POA: Diagnosis not present

## 2022-05-09 DIAGNOSIS — E785 Hyperlipidemia, unspecified: Secondary | ICD-10-CM | POA: Insufficient documentation

## 2022-05-09 DIAGNOSIS — E11621 Type 2 diabetes mellitus with foot ulcer: Secondary | ICD-10-CM | POA: Diagnosis not present

## 2022-05-09 DIAGNOSIS — L97529 Non-pressure chronic ulcer of other part of left foot with unspecified severity: Secondary | ICD-10-CM | POA: Diagnosis not present

## 2022-05-09 DIAGNOSIS — E08621 Diabetes mellitus due to underlying condition with foot ulcer: Secondary | ICD-10-CM | POA: Diagnosis not present

## 2022-05-09 DIAGNOSIS — E1165 Type 2 diabetes mellitus with hyperglycemia: Secondary | ICD-10-CM | POA: Diagnosis not present

## 2022-05-09 DIAGNOSIS — Z91148 Patient's other noncompliance with medication regimen for other reason: Secondary | ICD-10-CM | POA: Insufficient documentation

## 2022-05-09 DIAGNOSIS — Y939 Activity, unspecified: Secondary | ICD-10-CM | POA: Insufficient documentation

## 2022-05-09 DIAGNOSIS — S025XXA Fracture of tooth (traumatic), initial encounter for closed fracture: Secondary | ICD-10-CM | POA: Diagnosis not present

## 2022-05-09 DIAGNOSIS — Z716 Tobacco abuse counseling: Secondary | ICD-10-CM | POA: Diagnosis not present

## 2022-05-09 DIAGNOSIS — L97401 Non-pressure chronic ulcer of unspecified heel and midfoot limited to breakdown of skin: Secondary | ICD-10-CM

## 2022-05-09 DIAGNOSIS — I120 Hypertensive chronic kidney disease with stage 5 chronic kidney disease or end stage renal disease: Secondary | ICD-10-CM

## 2022-05-09 DIAGNOSIS — E1122 Type 2 diabetes mellitus with diabetic chronic kidney disease: Secondary | ICD-10-CM | POA: Diagnosis not present

## 2022-05-09 DIAGNOSIS — Z794 Long term (current) use of insulin: Secondary | ICD-10-CM | POA: Insufficient documentation

## 2022-05-09 DIAGNOSIS — Z7984 Long term (current) use of oral hypoglycemic drugs: Secondary | ICD-10-CM | POA: Insufficient documentation

## 2022-05-09 DIAGNOSIS — K047 Periapical abscess without sinus: Secondary | ICD-10-CM | POA: Diagnosis not present

## 2022-05-09 DIAGNOSIS — Z720 Tobacco use: Secondary | ICD-10-CM

## 2022-05-09 DIAGNOSIS — Y929 Unspecified place or not applicable: Secondary | ICD-10-CM | POA: Diagnosis not present

## 2022-05-09 HISTORY — DX: Hypertensive chronic kidney disease with stage 5 chronic kidney disease or end stage renal disease: I12.0

## 2022-05-09 LAB — POCT GLYCOSYLATED HEMOGLOBIN (HGB A1C): Hemoglobin A1C: 8.3 % — AB (ref 4.0–5.6)

## 2022-05-09 LAB — GLUCOSE, POCT (MANUAL RESULT ENTRY): POC Glucose: 135 mg/dl — AB (ref 70–99)

## 2022-05-09 MED ORDER — AMOXICILLIN-POT CLAVULANATE 875-125 MG PO TABS
1.0000 | ORAL_TABLET | Freq: Two times a day (BID) | ORAL | 0 refills | Status: DC
  Filled 2022-05-09: qty 20, 10d supply, fill #0

## 2022-05-09 MED ORDER — BUPROPION HCL ER (XL) 150 MG PO TB24
150.0000 mg | ORAL_TABLET | Freq: Every day | ORAL | 2 refills | Status: DC
  Filled 2022-05-09 – 2022-08-05 (×3): qty 60, 60d supply, fill #0
  Filled 2022-08-05: qty 60, 60d supply, fill #1
  Filled 2022-08-12 – 2022-08-15 (×3): qty 60, 60d supply, fill #0
  Filled 2022-10-21 – 2022-10-22 (×2): qty 60, 60d supply, fill #1

## 2022-05-09 MED ORDER — DAPAGLIFLOZIN PROPANEDIOL 10 MG PO TABS
10.0000 mg | ORAL_TABLET | Freq: Every day | ORAL | 4 refills | Status: DC
  Filled 2022-05-09 – 2022-06-10 (×2): qty 30, 30d supply, fill #0
  Filled 2022-07-11: qty 30, 30d supply, fill #1
  Filled 2022-08-12 – 2022-08-15 (×3): qty 30, 30d supply, fill #2
  Filled 2022-09-22: qty 30, 30d supply, fill #3
  Filled 2022-10-21: qty 30, 30d supply, fill #4
  Filled 2022-10-22: qty 30, 30d supply, fill #0

## 2022-05-09 MED ORDER — AMLODIPINE BESYLATE 10 MG PO TABS
5.0000 mg | ORAL_TABLET | Freq: Every day | ORAL | 2 refills | Status: DC
  Filled 2022-05-09: qty 45, 90d supply, fill #0

## 2022-05-09 MED ORDER — ATORVASTATIN CALCIUM 40 MG PO TABS
40.0000 mg | ORAL_TABLET | Freq: Every day | ORAL | 3 refills | Status: DC
  Filled 2022-05-09 – 2022-08-05 (×2): qty 90, 90d supply, fill #0
  Filled 2022-08-05: qty 90, 90d supply, fill #1
  Filled 2022-08-05 – 2022-08-15 (×4): qty 90, 90d supply, fill #0

## 2022-05-09 MED ORDER — INSULIN GLARGINE (1 UNIT DIAL) 300 UNIT/ML ~~LOC~~ SOPN
40.0000 [IU] | PEN_INJECTOR | Freq: Every day | SUBCUTANEOUS | 4 refills | Status: DC
  Filled 2022-05-09: qty 4.5, 33d supply, fill #0
  Filled 2022-06-17: qty 4.5, 33d supply, fill #1
  Filled 2022-07-30: qty 4.5, 33d supply, fill #2
  Filled 2022-07-31 – 2022-09-04 (×5): qty 4.5, 33d supply, fill #0
  Filled 2022-09-04: qty 4.5, 33d supply, fill #1
  Filled 2022-09-04 – 2022-10-17 (×2): qty 4.5, 33d supply, fill #0
  Filled 2022-10-17: qty 4.5, 33d supply, fill #1
  Filled 2022-10-22: qty 4.5, 33d supply, fill #0

## 2022-05-09 NOTE — Assessment & Plan Note (Signed)
History of diabetic foot ulcer not currently present

## 2022-05-09 NOTE — Assessment & Plan Note (Addendum)
Previously had stage V chronic kidney disease however now is improved and chronic kidney disease resolved

## 2022-05-09 NOTE — Progress Notes (Signed)
No concerns. 

## 2022-05-09 NOTE — Assessment & Plan Note (Signed)
Dental abscess right lower jaw and fractured first molar will refer to dentistry and give Augmentin twice daily for 10 days he will use ibuprofen for pain

## 2022-05-09 NOTE — Assessment & Plan Note (Signed)
Blood pressure previously was normal we were not treating it but now has recurred will start amlodipine 10 mg daily and follow-up short-term

## 2022-05-09 NOTE — Assessment & Plan Note (Signed)
Reassess lipids continue statin 

## 2022-05-09 NOTE — Assessment & Plan Note (Signed)
    Current smoking consumption amount: 1 pack a week  Dicsussion on advise to quit smoking and smoking impacts: Cardiovascular impacts  Patient's willingness to quit: Wants to quit  Methods to quit smoking discussed: Behavioral modification and medication  Medication management of smoking session drugs discussed: Failed nicotine replacement therefore begin Wellbutrin 150 mg daily  Resources provided:  AVS   Setting quit date not established Follow-up arranged 2 months   Time spent counseling the patient: 10 minutes

## 2022-05-09 NOTE — Progress Notes (Signed)
Established Patient Office Visit  Subjective   Patient ID: Bruce Cabrera, male    DOB: June 14, 1965  Age: 57 y.o. MRN: [id number]  Chief Complaint  Patient presents with   Hypertension   Diabetes    06/2021 This is a 57 year old male with type 2 diabetes last seen October 2022.  On arrival today blood pressure is 130/81 and A1c 10.6 blood sugar 270  The patient has not been using Trulicity or Toujeo because he has fear of needles.  He ran out of metformin has been out of medications for some time.  The patient's not been following a healthy diet as well.  Patient has been taking his medications for blood pressure.  Patient complains of some dryness in the mouth.  He has no other complaints  patient is followed now by podiatry for diabetic foot ulcer on the left foot  7/23  Patient seen in return follow-up and he had difficulty with abdominal pain on Trulicity so this had been stopped.  Patient is taking the Toujeo with smaller needles and tolerating well at 15 units daily.  On arrival blood sugar is quite elevated still.  Blood pressure however is at goal 120/76.  9/11 Patient seen in return follow-up from July.  Since the last visit the patient was hospitalized end of August for cellulitis and foot ulcer.  Below is a copy of the discharge summary. Admit date: 10/15/2021 Discharge date: 10/17/2021   Time spent: 36 minutes   Recommendations for Outpatient Follow-up:  1. Requires 10 days of antibiotics clindamycin ciprofloxacin on discharge for diabetic foot wound 2. Prior to monofilament testing and further follow-up with Dr. Amalia Hailey within 3 to 4 days to ensure wound looks good-patient has been followed by Dr. Amalia Hailey chronically for the past year 3. Limited prescription of pain meds given 4. Smoking cessation counseling to continue in outpatient setting   Discharge Diagnoses:  MAIN problem for hospitalization    Diabetic foot wound and ulcer   Please see below for itemized issues  addressed in [place]- refer to other progress notes for clarity if needed   Discharge Condition: Improved   Diet recommendation: Diabetic heart healthy   Filed Weights   10/15/21 1548 10/16/21 0552 Weight: 124.7 kg 118.3 kg     History of present illness:  57 year old black male known DM TY 2 with poor control A1c 10.6 HTN, known recurrent plantar wounds of the right foot--he has undergone several excisional debridement hyperkeratotic callus tissue was with Dr. Amalia Hailey   He smokes a lot and works at [employer] where they can smoke unrestrictedly    Patient was admitted on 8/28 with worsening wound on the right foot with a blister and started having a discharge he went to the podiatrist had wound debridement and was referred to ER because of signs concerning for sepsis as had chills and rigors   Patient was hospitalized kept on empiric IV antibiotics x-rays were stable Podiatrist Dr. Blenda Mounts saw the patient and MRI was ordered which did not confirm osteomyelitis-ABIs were performed that were negative for any blockages   Patient was placed with dressings wet-to-dry and was recommended to follow-up with Dr. Amalia Hailey in the outpatient was given limited prescription of pain meds and should complete a total of 14 days of antibiotics on discharge-we prescribed Cipro and clindamycin   He was also given a letter excusing him from work so that this would facilitate him going back to see Dr. Amalia Hailey in the outpatient setting for review  of the wound   He has had some progression of his renal disease and as such we felt that we should hold his ARB HCTZ combination until repeat labs   On arrival blood pressure is good 127/78.  He needs follow-up labs at this visit.  He declines a flu vaccine.  He notes that the right foot is improved.  He has been back to see podiatry and they have done additional work on the foot.  Blood sugar at home it runs anywhere from 145-190.  A1c today is down to 8.8.  He is no  longer smoking cigarettes.  He is still drinking 3 vodka drinks mixed with grapefruit daily.  He is finished all his antibiotics.  He does need a urine for microalbumin.  Blood sugar today on arrival is 97.  05/09/22 1. Type 2 diabetes mellitus with hyperglycemia, unspecified whether long term insulin use (HCC) Has improved on toujeo and farxiga - Glucose (CBG) - dapagliflozin propanediol (FARXIGA) 10 MG TABS tablet; Take 1 tablet (10 mg total) by mouth daily before breakfast.  Dispense: 30 tablet; Refill: 4   2. Hyperlipidemia due to type 2 diabetes mellitus (HCC) - atorvastatin (LIPITOR) 40 MG tablet; Take 1 tablet (40 mg total) by mouth daily.  Dispense: 30 tablet; Refill: 3   3. Elevated blood pressure reading Check blood pressure as often as you can.  The goal is for your blood pressure to be <130/<85.  Bring the readings to your next visit.   05/09/22 Patient seen in return follow-up unfortunately blood pressure remains elevated 153/88 The patient has been compliant with his medications.  He has stopped smoking for 3 months however resumed smoking a year ago 1 pack a week.  He has tried nicotine replacement without success.  He does complain of dental pain in the right lower jaw.  He has a cracked tooth in this area.  He has no other symptoms or complaints.    Patient Active Problem List   Diagnosis Date Noted   Dental abscess 05/09/2022   Tobacco use 08/23/2020   Right hip pain 08/23/2020   Polycythemia 12/26/2015   Diabetes mellitus ([place]) 11/25/2013   Hyperlipidemia due to type 2 diabetes mellitus ([place]) 10/13/2006   Essential hypertension 10/13/2006   OBESITY 10/10/2006   Past Medical History:  Diagnosis Date   Diabetes mellitus without complication ([place])    Diabetic foot ulcer ([place]) 10/15/2021   Hyperlipidemia associated with type 2 diabetes mellitus ([place])    based on 2011 profile   Hypertension    Hypertensive chronic kidney disease with stage 5 chronic kidney disease  or end stage renal disease ([place]) 05/09/2022   Past Surgical History:  Procedure Laterality Date   FOOT SURGERY Left    LEFT HEART CATHETERIZATION WITH CORONARY ANGIOGRAM N/A 11/29/2013   Procedure: LEFT HEART CATHETERIZATION WITH CORONARY ANGIOGRAM;  Surgeon: Troy Sine, MD;  Location: [facility] CATH LAB;  Service: Cardiovascular;  Laterality: N/A;   None     Social History   Tobacco Use   Smoking status: Some Days    Packs/day: 0.50    Years: 30.00    Additional pack years: 0.00    Total pack years: 15.00    Types: Cigarettes   Smokeless tobacco: Never  Vaping Use   Vaping Use: Never used  Substance Use Topics   Alcohol use: Yes    Comment: 2- tallboys a day   Drug use: No   Social History   Socioeconomic History   Marital  status: Single    Spouse name: Not on file   Number of children: Not on file   Years of education: Not on file   Highest education level: Not on file  Occupational History   Occupation: Counsellor at [employer]   Smoking status: Some Days    Packs/day: 0.50    Years: 30.00    Additional pack years: 0.00    Total pack years: 15.00    Types: Cigarettes   Smokeless tobacco: Never  Vaping Use   Vaping Use: Never used  Substance and Sexual Activity   Alcohol use: Yes    Comment: 2- tallboys a day   Drug use: No   Sexual activity: Not on file  Other Topics Concern   Not on file  Social History Narrative   Lives with roommate   Social Determinants of Health   Financial Resource Strain: Not on file  Food Insecurity: Not on file  Transportation Needs: Not on file  Physical Activity: Not on file  Stress: Not on file  Social Connections: Not on file  Intimate Partner Violence: Not on file   Family Status  Relation Name Status   Mother  Deceased at age 60   Father  Deceased       no info   Brother  Web designer  (Not Specified)   Sister  (Not Specified)   Family History  Problem Relation Age of Onset   Heart attack Mother  19   Heart failure Brother 24   Stroke Sister       Review of Systems  Constitutional:  Negative for chills, diaphoresis, fever, malaise/fatigue and weight loss.  HENT:  Negative for congestion, ear discharge, ear pain, hearing loss, nosebleeds, sore throat and tinnitus.        Dental pain  Eyes:  Negative for blurred vision, double vision, photophobia, discharge and redness.  Respiratory:  Negative for cough, hemoptysis, sputum production, shortness of breath, wheezing and stridor.        No excess mucus  Cardiovascular:  Negative for chest pain, palpitations, orthopnea, claudication, leg swelling and PND.  Gastrointestinal:  Negative for abdominal pain, blood in stool, constipation, diarrhea, heartburn, melena, nausea and vomiting.  Genitourinary:  Negative for dysuria, flank pain, frequency, hematuria and urgency.  Musculoskeletal:  Negative for back pain, falls, joint pain, myalgias and neck pain.  Skin:  Negative for itching and rash.  Neurological:  Negative for dizziness, tingling, tremors, sensory change, speech change, focal weakness, seizures, loss of consciousness, weakness and headaches.  Endo/Heme/Allergies:  Negative for environmental allergies and polydipsia. Does not bruise/bleed easily.  Psychiatric/Behavioral:  Negative for depression, hallucinations, memory loss, substance abuse and suicidal ideas. The patient is not nervous/anxious and does not have insomnia.   All other systems reviewed and are negative.     Objective:     BP (!) 153/88   Pulse 80   Ht 6\' 3"  (1.905 m)   Wt 282 lb 3.2 oz (128 kg)   SpO2 98%   BMI 35.27 kg/m  BP Readings from Last 3 Encounters:  05/09/22 (!) 153/88  02/27/22 139/76  10/29/21 127/78   Wt Readings from Last 3 Encounters:  05/09/22 282 lb 3.2 oz (128 kg)  02/27/22 270 lb (122.5 kg)  10/29/21 265 lb 3.2 oz (120.3 kg)      Physical Exam Vitals reviewed.  Constitutional:      Appearance: Normal appearance. He is  well-developed. He is obese. He is  not diaphoretic.  HENT:     Head: Normocephalic and atraumatic.     Nose: No nasal deformity, septal deviation, mucosal edema or rhinorrhea.     Right Sinus: No maxillary sinus tenderness or frontal sinus tenderness.     Left Sinus: No maxillary sinus tenderness or frontal sinus tenderness.     Mouth/Throat:     Pharynx: No oropharyngeal exudate.     Comments: Dental abscess right lower lobe surrounding first molar that is cracked Eyes:     General: No scleral icterus.    Conjunctiva/sclera: Conjunctivae normal.     Pupils: Pupils are equal, round, and reactive to light.  Neck:     Thyroid: No thyromegaly.     Vascular: No carotid bruit or JVD.     Trachea: Trachea normal. No tracheal tenderness or tracheal deviation.  Cardiovascular:     Rate and Rhythm: Normal rate and regular rhythm.     Chest Wall: PMI is not displaced.     Pulses: Normal pulses. No decreased pulses.     Heart sounds: Normal heart sounds, S1 normal and S2 normal. Heart sounds not distant. No murmur heard.    No systolic murmur is present.     No diastolic murmur is present.     No friction rub. No gallop. No S3 or S4 sounds.  Pulmonary:     Effort: No tachypnea, accessory muscle usage or respiratory distress.     Breath sounds: No stridor. No decreased breath sounds, wheezing, rhonchi or rales.  Chest:     Chest wall: No tenderness.  Abdominal:     General: Bowel sounds are normal. There is no distension.     Palpations: Abdomen is soft. Abdomen is not rigid.     Tenderness: There is no abdominal tenderness. There is no guarding or rebound.  Musculoskeletal:        General: Normal range of motion.     Cervical back: Normal range of motion and neck supple. No edema, erythema or rigidity. No muscular tenderness. Normal range of motion.  Lymphadenopathy:     Head:     Right side of head: No submental or submandibular adenopathy.     Left side of head: No submental or  submandibular adenopathy.     Cervical: No cervical adenopathy.  Skin:    General: Skin is warm and dry.     Coloration: Skin is not pale.     Findings: No rash.     Nails: There is no clubbing.  Neurological:     Mental Status: He is alert and oriented to person, place, and time.     Sensory: No sensory deficit.  Psychiatric:        Speech: Speech normal.        Behavior: Behavior normal.      Results for orders placed or performed in visit on 05/09/22  POCT glycosylated hemoglobin (Hb A1C)  Result Value Ref Range   Hemoglobin A1C 8.3 (A) 4.0 - 5.6 %   HbA1c POC (<> result, manual entry)     HbA1c, POC (prediabetic range)     HbA1c, POC (controlled diabetic range)    POCT glucose (manual entry)  Result Value Ref Range   POC Glucose 135 (A) 70 - 99 mg/dl    Last CBC Lab Results  Component Value Date   WBC 8.6 10/29/2021   HGB 12.3 (L) 10/29/2021   HCT 36.1 (L) 10/29/2021   MCV 74 (L) 10/29/2021   MCH 25.2 (L)  10/29/2021   RDW 14.3 10/29/2021   PLT 376 0000000   Last metabolic panel Lab Results  Component Value Date   GLUCOSE 86 12/13/2021   NA 142 12/13/2021   K 4.3 12/13/2021   CL 105 12/13/2021   CO2 23 12/13/2021   BUN 17 12/13/2021   CREATININE 1.20 12/13/2021   EGFR 72 12/13/2021   CALCIUM 9.0 12/13/2021   PROT 7.1 12/13/2021   ALBUMIN 4.3 12/13/2021   LABGLOB 2.8 12/13/2021   AGRATIO 1.5 12/13/2021   BILITOT 0.3 12/13/2021   ALKPHOS 45 12/13/2021   AST 16 12/13/2021   ALT 15 12/13/2021   ANIONGAP 14 10/16/2021   Last lipids Lab Results  Component Value Date   CHOL 132 12/12/2020   HDL 31 (L) 12/12/2020   LDLCALC 71 12/12/2020   TRIG 177 (H) 12/12/2020   CHOLHDL 4.3 12/12/2020   Last hemoglobin A1c Lab Results  Component Value Date   HGBA1C 8.3 (A) 05/09/2022   Last thyroid functions Lab Results  Component Value Date   TSH 1.520 10/11/2020   T4TOTAL 7.0 10/11/2020      The 10-year ASCVD risk score (Arnett DK, et al., 2019)  is: 40.9%    Assessment & Plan:   Problem List Items Addressed This Visit       Cardiovascular and Mediastinum   Essential hypertension    Blood pressure previously was normal we were not treating it but now has recurred will start amlodipine 10 mg daily and follow-up short-term      Relevant Medications   atorvastatin (LIPITOR) 40 MG tablet   amLODipine (NORVASC) 10 MG tablet     Digestive   Dental abscess    Dental abscess right lower jaw and fractured first molar will refer to dentistry and give Augmentin twice daily for 10 days he will use ibuprofen for pain      Relevant Orders   Ambulatory referral to Dentistry     Endocrine   Hyperlipidemia due to type 2 diabetes mellitus ([place])    Reassess lipids continue statin      Relevant Medications   atorvastatin (LIPITOR) 40 MG tablet   dapagliflozin propanediol (FARXIGA) 10 MG TABS tablet   insulin glargine, 1 Unit Dial, (TOUJEO) 300 UNIT/ML Solostar Pen   amLODipine (NORVASC) 10 MG tablet   Other Relevant Orders   Lipid panel   Diabetes mellitus ([place]) - Primary    Type 2 diabetes not yet at goal A1c 8.3  Plan will be to stay on Farxiga 10 mg daily increase insulin glargine to 40 units daily and see back short-term follow-up      Relevant Medications   atorvastatin (LIPITOR) 40 MG tablet   dapagliflozin propanediol (FARXIGA) 10 MG TABS tablet   insulin glargine, 1 Unit Dial, (TOUJEO) 300 UNIT/ML Solostar Pen   Other Relevant Orders   POCT glycosylated hemoglobin (Hb A1C) (Completed)   POCT glucose (manual entry) (Completed)   Lipid panel   RESOLVED: Diabetic foot ulcer ([place])    History of diabetic foot ulcer not currently present      Relevant Medications   atorvastatin (LIPITOR) 40 MG tablet   dapagliflozin propanediol (FARXIGA) 10 MG TABS tablet   insulin glargine, 1 Unit Dial, (TOUJEO) 300 UNIT/ML Solostar Pen     Genitourinary   RESOLVED: Hypertensive chronic kidney disease with stage 5 chronic kidney  disease or end stage renal disease (HCC)    Previously had stage V chronic kidney disease however now is improved and chronic kidney  disease resolved      Relevant Orders   Basic metabolic panel     Other   Tobacco use       Current smoking consumption amount: 1 pack a week  Dicsussion on advise to quit smoking and smoking impacts: Cardiovascular impacts  Patient's willingness to quit: Wants to quit  Methods to quit smoking discussed: Behavioral modification and medication  Medication management of smoking session drugs discussed: Failed nicotine replacement therefore begin Wellbutrin 150 mg daily  Resources provided:  AVS   Setting quit date not established Follow-up arranged 2 months   Time spent counseling the patient: 10 minutes       Other Visit Diagnoses     Closed fracture of tooth, initial encounter       Relevant Orders   Ambulatory referral to Dentistry     34 minutes spent complex decision making high Return in about 6 weeks (around 06/20/2022) for htn, diabetes.     Asencion Noble, MD

## 2022-05-09 NOTE — Assessment & Plan Note (Signed)
Type 2 diabetes not yet at goal A1c 8.3  Plan will be to stay on Farxiga 10 mg daily increase insulin glargine to 40 units daily and see back short-term follow-up

## 2022-05-09 NOTE — Patient Instructions (Signed)
Take augmentin twice daily for dental abscess, dentist referral made All medications refilled, increase insulin to 40 units daily Stay on Farxiga at current dose it is at maximum Start amlodipine for blood pressure Labs metabolic panel and cholesterol  Return [facility] clinical pharmacist 4 weeks , dr Joya Gaskins 6 weeks

## 2022-05-10 ENCOUNTER — Telehealth: Payer: Self-pay

## 2022-05-10 LAB — BASIC METABOLIC PANEL
BUN/Creatinine Ratio: 13 (ref 9–20)
BUN: 18 mg/dL (ref 6–24)
CO2: 23 mmol/L (ref 20–29)
Calcium: 9 mg/dL (ref 8.7–10.2)
Chloride: 105 mmol/L (ref 96–106)
Creatinine, Ser: 1.36 mg/dL — ABNORMAL HIGH (ref 0.76–1.27)
Glucose: 95 mg/dL (ref 70–99)
Potassium: 4.2 mmol/L (ref 3.5–5.2)
Sodium: 141 mmol/L (ref 134–144)
eGFR: 62 mL/min/{1.73_m2} (ref >59)

## 2022-05-10 LAB — LIPID PANEL
Chol/HDL Ratio: 3.9 ratio (ref 0.0–5.0)
Cholesterol, Total: 172 mg/dL (ref 100–199)
HDL: 44 mg/dL (ref >39)
LDL Chol Calc (NIH): 102 mg/dL — ABNORMAL HIGH (ref 0–99)
Triglycerides: 150 mg/dL — ABNORMAL HIGH (ref 0–149)
VLDL Cholesterol Cal: 26 mg/dL (ref 5–40)

## 2022-05-10 NOTE — Telephone Encounter (Signed)
Pt was called and is aware of results, DOB was confirmed.  ?

## 2022-05-10 NOTE — Progress Notes (Signed)
Let pt know kidney stable  cholesterol high stay on atorvastatin

## 2022-05-10 NOTE — Telephone Encounter (Signed)
-----   Message from Elsie Stain, MD sent at 05/10/2022  5:58 AM EDT ----- Let pt know kidney stable  cholesterol high stay on atorvastatin

## 2022-05-13 ENCOUNTER — Other Ambulatory Visit: Payer: Self-pay

## 2022-06-07 ENCOUNTER — Other Ambulatory Visit: Payer: Self-pay | Admitting: Nurse Practitioner

## 2022-06-07 ENCOUNTER — Ambulatory Visit: Payer: BC Managed Care – PPO | Attending: Critical Care Medicine | Admitting: Pharmacist

## 2022-06-07 ENCOUNTER — Encounter: Payer: Self-pay | Admitting: Pharmacist

## 2022-06-07 ENCOUNTER — Other Ambulatory Visit: Payer: Self-pay

## 2022-06-07 VITALS — BP 113/72 | HR 80

## 2022-06-07 DIAGNOSIS — I10 Essential (primary) hypertension: Secondary | ICD-10-CM | POA: Diagnosis not present

## 2022-06-07 DIAGNOSIS — E1165 Type 2 diabetes mellitus with hyperglycemia: Secondary | ICD-10-CM | POA: Diagnosis not present

## 2022-06-07 DIAGNOSIS — E0800 Diabetes mellitus due to underlying condition with hyperosmolarity without nonketotic hyperglycemic-hyperosmolar coma (NKHHC): Secondary | ICD-10-CM

## 2022-06-07 MED ORDER — AMLODIPINE BESYLATE 10 MG PO TABS
10.0000 mg | ORAL_TABLET | Freq: Every day | ORAL | 2 refills | Status: DC
  Filled 2022-06-07 – 2022-07-11 (×2): qty 90, 90d supply, fill #0

## 2022-06-07 NOTE — Progress Notes (Signed)
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  PMH is significant for HTN, HLD, T2DM, and obesity.  Patient was referred and last seen by Primary Care Provider, Dr. Delford Field, on 10/29/21. Last seen by pharmacy clinic on 01/21/2022. At that visit, A1c had improved from 8.8% to 6.4% and metformin XR was discontinued due to GI side effects. Patient was seen by PCP, Dr. Delford Field, on 05/09/2022 and A1c had increased to 8.3%. Patient reported home BG readings 145-190 and insulin was increased to 40 units daily. At that visit, BP was above goal at 153/88 and Dr. Delford Field started amlodipine 5 mg daily.   Today, patient arrives in good spirits and presents without any assistance. Patient was previously on metformin and Trulicity, however were both discontinued due to GI side effects. Patient reports he follows low sugar diet, but does splurge on Sundays. Reports has noticed thickening of toenails and requests podiatry referral. Patient denies lower edema swelling since starting amlodipine.   Patient reports he has had a dental infection for six months, which may have contributed to increase in A1c, He had a bottle of Augmentin with him today that was not started. He did not start it as he was not sure what it was for. Patient in agreement to start antibiotic tonight.   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 At last visit reported smoking once a week. Today, he reports smoking 1 pack every week and a half. He does have Wellbutrin that he had not started taking to help with smoking cessation. Agreed to start tonight.  Patient reports Diabetes is longstanding.   Family/Social History:  Fhx: MI, CHF, stroke Tobacco: 1 pack every week and a half Alcohol: none reported   Current diabetes medications include: Farxiga 10 mg daily, Toujeo 40 units once daily  Current antihypertensive medications  include: amlodipine 5 mg daily (pt taking 10 mg daily)  Patient reports adherence to taking all medications as prescribed, except not taking Wellbutrin and Augmentin as he was not sure what they were for. Agreeable to start tonight. Reports taking amlodipine 10 mg daily instead of prescribed 5 mg daily.   Insurance coverage: BCBS  Patient reports hypoglycemic events.  Reported home fasting blood sugars: 120-140s (improved previously reported 145-190)  Denies hypoglycemic events.   Patient endorses nocturia (nighttime urination).  Patient denies neuropathy (nerve pain). Patient denies visual changes. Patient reports self foot exams.   Patient reported dietary habits:  -Pt admits to dietary indiscretion -Continues to struggle with this.  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 Admits to eating cake on Sundays    Patient-reported exercise habits:  -No formal regimen outside of work.  O:    Lab Results  Component Value Date   HGBA1C 8.3 (A) 05/09/2022   Vitals:   06/07/22 0934  BP: 113/72  Pulse: 80    Lipid Panel     Component Value Date/Time   CHOL 172 05/09/2022 1124   TRIG 150 (H) 05/09/2022 1124   HDL 44 05/09/2022 1124   CHOLHDL 3.9 05/09/2022 1124   CHOLHDL 7.1 11/25/2013 1639   VLDL 20 11/25/2013 1639   LDLCALC 102 (H) 05/09/2022 1124    Clinical Atherosclerotic Cardiovascular Disease (ASCVD): No  The 10-year ASCVD risk score (Arnett DK, et al., 2019) is: 24.9%   Values used to calculate the score:     Age:  56 years     Sex: Male     Is Non-Hispanic African American: Yes     Diabetic: Yes     Tobacco smoker: Yes     Systolic Blood Pressure: 113 mmHg     Is BP treated: Yes     HDL Cholesterol: 44 mg/dL     Total Cholesterol: 172 mg/dL   A/P: Diabetes longstanding currently at goal based on A1c (6.4%). Patient is able to verbalize appropriate hypoglycemia management plan. Medication adherence appears appropriate.  -Continued basal insulin Toujeo 40 units once  daily  -Continued SGLT2-I Farxiga  once daily  -Intolerant to [employer] -Extensively discussed pathophysiology of diabetes, recommended lifestyle interventions, dietary effects on blood sugar control.  -Counseled on s/sx of and management of hypoglycemia.  -Counseled to take fasting BG readings -Next A1c anticipated June 2024.   Hypertension diagnosed currently controlled on current medications. BP goal < 130/80 mmHg.  -Increased amlodipine 5 mg to 10 mg daily. As this is what patient reported taking at home.   Encouraged patient to start taking Augmentin and Wellbutrin as prescribed and explained indications.   Written patient instructions provided. Patient verbalized understanding of treatment plan.  Total time in face to face counseling 20 minutes.    Follow-up:  Pharmacist in 2 months PCP clinic visit in May  Azarya Oconnell, [place].D PGY1 Pharmacy Resident 06/07/2022 10:00 AM

## 2022-06-10 ENCOUNTER — Other Ambulatory Visit (HOSPITAL_COMMUNITY): Payer: Self-pay

## 2022-06-10 ENCOUNTER — Other Ambulatory Visit: Payer: Self-pay

## 2022-06-11 ENCOUNTER — Other Ambulatory Visit: Payer: Self-pay

## 2022-06-17 ENCOUNTER — Other Ambulatory Visit: Payer: Self-pay

## 2022-06-18 ENCOUNTER — Other Ambulatory Visit: Payer: Self-pay

## 2022-06-21 ENCOUNTER — Ambulatory Visit (INDEPENDENT_AMBULATORY_CARE_PROVIDER_SITE_OTHER): Payer: BC Managed Care – PPO | Admitting: Podiatry

## 2022-06-21 ENCOUNTER — Other Ambulatory Visit: Payer: Self-pay

## 2022-06-21 ENCOUNTER — Other Ambulatory Visit (HOSPITAL_COMMUNITY): Payer: Self-pay

## 2022-06-21 DIAGNOSIS — L03115 Cellulitis of right lower limb: Secondary | ICD-10-CM | POA: Diagnosis not present

## 2022-06-21 DIAGNOSIS — M79675 Pain in left toe(s): Secondary | ICD-10-CM | POA: Diagnosis not present

## 2022-06-21 DIAGNOSIS — L97512 Non-pressure chronic ulcer of other part of right foot with fat layer exposed: Secondary | ICD-10-CM | POA: Diagnosis not present

## 2022-06-21 DIAGNOSIS — B351 Tinea unguium: Secondary | ICD-10-CM | POA: Diagnosis not present

## 2022-06-21 DIAGNOSIS — E0843 Diabetes mellitus due to underlying condition with diabetic autonomic (poly)neuropathy: Secondary | ICD-10-CM

## 2022-06-21 DIAGNOSIS — M79674 Pain in right toe(s): Secondary | ICD-10-CM

## 2022-06-21 MED ORDER — DOXYCYCLINE HYCLATE 100 MG PO TABS
100.0000 mg | ORAL_TABLET | Freq: Two times a day (BID) | ORAL | 0 refills | Status: DC
  Filled 2022-06-21 (×2): qty 28, 14d supply, fill #0

## 2022-06-21 NOTE — Progress Notes (Signed)
Chief Complaint  Patient presents with   Diabetic Ulcer    Right foot diabetic ulcer follow-up, rate of pain 9 out 10 while walking, no drainage, Diabetic A1c- 6.0    Subjective: 57 y.o. male PMHx diabetes mellitus presenting to the office today for follow-up evaluation of a a symptomatic callus to the plantar first MTP of the right foot.  Originally the patient was scheduled for fibular sesamoid excision to offload pressure from the preulcerative callus lesion but the co-pay was given to be too expensive initially.  He is hoping to have the surgery later this summer.  Presenting for routine footcare for now  Past Medical History:  Diagnosis Date   Diabetes mellitus without complication (HCC)    Hyperlipidemia associated with type 2 diabetes mellitus (HCC)    based on 2011 profile   Hypertension     Past Surgical History:  Procedure Laterality Date   FOOT SURGERY Left    LEFT HEART CATHETERIZATION WITH CORONARY ANGIOGRAM N/A 11/29/2013   Procedure: LEFT HEART CATHETERIZATION WITH CORONARY ANGIOGRAM;  Surgeon: Lennette Bihari, MD;  Location: [facility] CATH LAB;  Service: Cardiovascular;  Laterality: N/A;   None      Allergies  Allergen Reactions   Trulicity [Dulaglutide] Other (See Comments)    Abdominal pain     Objective:  Physical Exam General: Alert and oriented x3 in no acute distress  Dermatology: After debridement of the hyperkeratotic callus to the plantar aspect of the first MTP there is an underlying ulcer.  Ulcer measures approximately 2.0 x 2.0 x 0.2 cm.  Granular wound base.  There is maceration within the surrounding soft tissue.  Mild malodor.  Minimal erythema especially to the medial aspect of the first MTP  Vascular: VAS [place] ABI W/WO TBI 10/16/2021 ABI Findings:  +---------+------------------+-----+---------+-----------------------------  ----+  Right   Rt Pressure (mmHg)IndexWaveform Comment                              +---------+------------------+-----+---------+-----------------------------  ----+  Brachial 103                    triphasic                                    +---------+------------------+-----+---------+-----------------------------  ----+  PTA     102               0.96 triphasic                                    +---------+------------------+-----+---------+-----------------------------  ----+  DP      99                0.93 biphasic                                     +---------+------------------+-----+---------+-----------------------------  ----+  Great Toe                                unable to obtain right great  toe  pressure due to ulceration          +---------+------------------+-----+---------+-----------------------------  ----+   +---------+------------------+-----+---------+-------+  Left    Lt Pressure (mmHg)IndexWaveform Comment  +---------+------------------+-----+---------+-------+  Brachial 106                    triphasic         +---------+------------------+-----+---------+-------+  PTA     122               1.15 triphasic         +---------+------------------+-----+---------+-------+  DP      124               1.17 triphasic         +---------+------------------+-----+---------+-------+  Great Toe86                0.81 Normal            +---------+------------------+-----+---------+-------+  Summary:  Right: Resting right ankle-brachial index is within normal range.   Unable to obtain right great toe pressure due to ulceration.  Left: Resting left ankle-brachial index is within normal range. The left  toe-brachial index is normal.   Neurological: Epicritic and protective threshold grossly intact bilaterally.   Musculoskeletal Exam: Significant tenderness with palpation directly related to the fibular sesamoid of the right foot first MTP.  No  pain with palpation to the tibial sesamoid  Radiographic exam RT foot 03/08/2022: Radiographic marker noted associated closely to the fibular sesamoid of the first MTP right foot.  No acute fractures identified.  Joint spaces mostly preserved.  MR FOOT RIGHT WO CONTRAST 10/15/2021: IMPRESSION: 1. No acute osteomyelitis of the right forefoot. 2. Skin thickening with superficial wound or ulceration along the plantar aspect of the distal forefoot near the first MTP joint level. Generalized soft tissue edema without organized fluid collection.  Assessment: 1.  Ulcer plantar aspect of the first MTP right foot secondary to diabetes mellitus 2.  Chronic fibular sesamoiditis right 3.  Pain due to onychomycosis of toenails both 4.  Localized cellulitis first MTP right  -Patient hoping to have surgery later this summer. Surgery will consist of fibular sesmoidectomy of the right foot -Medically necessary excisional debridement including subcutaneous tissue was performed using a tissue nipper.  Excisional debridement of the necrotic nonviable tissue down to healthier bleeding viable tissue was performed with postdebridement measurement same as pre- -Recommend Betadine and a dressing daily -Prescription for doxycycline 100 mg 2 times daily #28 -Mechanical debridement of nails 1-5 bilateral was also performed using a nail nipper without incident or bleeding -Return to clinic 4 weeks  Felecia Shelling, DPM [facility]  Dr. Felecia Shelling, DPM    [address] [place], [place] [address]                Office [phone]  Fax [phone]

## 2022-07-04 ENCOUNTER — Encounter: Payer: Self-pay | Admitting: Critical Care Medicine

## 2022-07-04 ENCOUNTER — Ambulatory Visit: Payer: BC Managed Care – PPO | Attending: Critical Care Medicine | Admitting: Critical Care Medicine

## 2022-07-04 VITALS — BP 110/69 | HR 74 | Ht 75.0 in | Wt 283.0 lb

## 2022-07-04 DIAGNOSIS — E785 Hyperlipidemia, unspecified: Secondary | ICD-10-CM

## 2022-07-04 DIAGNOSIS — Z7984 Long term (current) use of oral hypoglycemic drugs: Secondary | ICD-10-CM

## 2022-07-04 DIAGNOSIS — I10 Essential (primary) hypertension: Secondary | ICD-10-CM

## 2022-07-04 DIAGNOSIS — E1169 Type 2 diabetes mellitus with other specified complication: Secondary | ICD-10-CM | POA: Diagnosis not present

## 2022-07-04 DIAGNOSIS — K047 Periapical abscess without sinus: Secondary | ICD-10-CM

## 2022-07-04 DIAGNOSIS — E1165 Type 2 diabetes mellitus with hyperglycemia: Secondary | ICD-10-CM | POA: Diagnosis not present

## 2022-07-04 DIAGNOSIS — E11630 Type 2 diabetes mellitus with periodontal disease: Secondary | ICD-10-CM | POA: Diagnosis not present

## 2022-07-04 DIAGNOSIS — F17210 Nicotine dependence, cigarettes, uncomplicated: Secondary | ICD-10-CM

## 2022-07-04 LAB — GLUCOSE, POCT (MANUAL RESULT ENTRY): POC Glucose: 161 mg/dl — AB (ref 70–99)

## 2022-07-04 NOTE — Assessment & Plan Note (Signed)
Referral to dentistry. 

## 2022-07-04 NOTE — Progress Notes (Signed)
Established Patient Office Visit  Subjective   Patient ID: Bruce Cabrera, male    DOB: 03/19/1965  Age: 57 y.o. MRN: [id number]  Chief Complaint  Patient presents with   Hypertension    06/2021 This is a 57 year old male with type 2 diabetes last seen October 2022.  On arrival today blood pressure is 130/81 and A1c 10.6 blood sugar 270  The patient has not been using Trulicity or Toujeo because he has fear of needles.  He ran out of metformin has been out of medications for some time.  The patient's not been following a healthy diet as well.  Patient has been taking his medications for blood pressure.  Patient complains of some dryness in the mouth.  He has no other complaints  patient is followed now by podiatry for diabetic foot ulcer on the left foot  7/23  Patient seen in return follow-up and he had difficulty with abdominal pain on Trulicity so this had been stopped.  Patient is taking the Toujeo with smaller needles and tolerating well at 15 units daily.  On arrival blood sugar is quite elevated still.  Blood pressure however is at goal 120/76.  9/11 Patient seen in return follow-up from July.  Since the last visit the patient was hospitalized end of August for cellulitis and foot ulcer.  Below is a copy of the discharge summary. Admit date: 10/15/2021 Discharge date: 10/17/2021   Time spent: 36 minutes   Recommendations for Outpatient Follow-up:  1. Requires 10 days of antibiotics clindamycin ciprofloxacin on discharge for diabetic foot wound 2. Prior to monofilament testing and further follow-up with Dr. Logan Bores within 3 to 4 days to ensure wound looks good-patient has been followed by Dr. Logan Bores chronically for the past year 3. Limited prescription of pain meds given 4. Smoking cessation counseling to continue in outpatient setting   Discharge Diagnoses:  MAIN problem for hospitalization    Diabetic foot wound and ulcer   Please see below for itemized issues addressed in  [facility]- refer to other progress notes for clarity if needed   Discharge Condition: Improved   Diet recommendation: Diabetic heart healthy   Filed Weights   10/15/21 1548 10/16/21 0552 Weight: 124.7 kg 118.3 kg     History of present illness:  57 year old black male known DM TY 2 with poor control A1c 10.6 HTN, known recurrent plantar wounds of the right foot--he has undergone several excisional debridement hyperkeratotic callus tissue was with Dr. Logan Bores   He smokes a lot and works at [employer] where they can smoke unrestrictedly    Patient was admitted on 8/28 with worsening wound on the right foot with a blister and started having a discharge he went to the podiatrist had wound debridement and was referred to ER because of signs concerning for sepsis as had chills and rigors   Patient was hospitalized kept on empiric IV antibiotics x-rays were stable Podiatrist Dr. Ralene Cork saw the patient and MRI was ordered which did not confirm osteomyelitis-ABIs were performed that were negative for any blockages   Patient was placed with dressings wet-to-dry and was recommended to follow-up with Dr. Logan Bores in the outpatient was given limited prescription of pain meds and should complete a total of 14 days of antibiotics on discharge-we prescribed Cipro and clindamycin   He was also given a letter excusing him from work so that this would facilitate him going back to see Dr. Logan Bores in the outpatient setting for review of the wound  He has had some progression of his renal disease and as such we felt that we should hold his ARB HCTZ combination until repeat labs   On arrival blood pressure is good 127/78.  He needs follow-up labs at this visit.  He declines a flu vaccine.  He notes that the right foot is improved.  He has been back to see podiatry and they have done additional work on the foot.  Blood sugar at home it runs anywhere from 145-190.  A1c today is down to 8.8.  He is no longer smoking  cigarettes.  He is still drinking 3 vodka drinks mixed with grapefruit daily.  He is finished all his antibiotics.  He does need a urine for microalbumin.  Blood sugar today on arrival is 97.  05/09/22 1. Type 2 diabetes mellitus with hyperglycemia, unspecified whether long term insulin use (HCC) Has improved on toujeo and farxiga - Glucose (CBG) - dapagliflozin propanediol (FARXIGA) 10 MG TABS tablet; Take 1 tablet (10 mg total) by mouth daily before breakfast.  Dispense: 30 tablet; Refill: 4   2. Hyperlipidemia due to type 2 diabetes mellitus (HCC) - atorvastatin (LIPITOR) 40 MG tablet; Take 1 tablet (40 mg total) by mouth daily.  Dispense: 30 tablet; Refill: 3   3. Elevated blood pressure reading Check blood pressure as often as you can.  The goal is for your blood pressure to be <130/<85.  Bring the readings to your next visit.   05/09/22 Patient seen in return follow-up unfortunately blood pressure remains elevated 153/88 The patient has been compliant with his medications.  He has stopped smoking for 3 months however resumed smoking a year ago 1 pack a week.  He has tried nicotine replacement without success.  He does complain of dental pain in the right lower jaw.  He has a cracked tooth in this area.  He has no other symptoms or complaints.  07/04/22 Patient seen in return follow-up for hypertension diabetes.  Blood sugar today ranging from 145 260.  His A1c is above 7.  He cannot take Trulicity but he is on the [place] and long-acting insulin He complains of dental problems needs a referral to dentist Patient continues to smoke 1 cigarette daily    Patient Active Problem List   Diagnosis Date Noted   Dental abscess 05/09/2022   Tobacco use 08/23/2020   Right hip pain 08/23/2020   Polycythemia 12/26/2015   Diabetes mellitus (HCC) 11/25/2013   Hyperlipidemia due to type 2 diabetes mellitus (HCC) 10/13/2006   Essential hypertension 10/13/2006   OBESITY 10/10/2006   Past  Medical History:  Diagnosis Date   Diabetes mellitus without complication (HCC)    Diabetic foot ulcer (HCC) 10/15/2021   Hyperlipidemia associated with type 2 diabetes mellitus (HCC)    based on 2011 profile   Hypertension    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) 05/09/2022   Past Surgical History:  Procedure Laterality Date   FOOT SURGERY Left    LEFT HEART CATHETERIZATION WITH CORONARY ANGIOGRAM N/A 11/29/2013   Procedure: LEFT HEART CATHETERIZATION WITH CORONARY ANGIOGRAM;  Surgeon: Lennette Bihari, MD;  Location: [facility] CATH LAB;  Service: Cardiovascular;  Laterality: N/A;   None     Social History   Tobacco Use   Smoking status: Some Days    Packs/day: 0.50    Years: 30.00    Additional pack years: 0.00    Total pack years: 15.00  Types: Cigarettes   Smokeless tobacco: Never  Vaping Use   Vaping Use: Never used  Substance Use Topics   Alcohol use: Yes    Comment: 2- tallboys a day   Drug use: No   Social History   Socioeconomic History   Marital status: Single    Spouse name: Not on file   Number of children: Not on file   Years of education: Not on file   Highest education level: Not on file  Occupational History   Occupation: Science writer at [employer]  Tobacco Use   Smoking status: Some Days    Packs/day: 0.50    Years: 30.00    Additional pack years: 0.00    Total pack years: 15.00    Types: Cigarettes   Smokeless tobacco: Never  Vaping Use   Vaping Use: Never used  Substance and Sexual Activity   Alcohol use: Yes    Comment: 2- tallboys a day   Drug use: No   Sexual activity: Not on file  Other Topics Concern   Not on file  Social History Narrative   Lives with roommate   Social Determinants of Health   Financial Resource Strain: Not on file  Food Insecurity: Not on file  Transportation Needs: Not on file  Physical Activity: Not on file  Stress: Not on file  Social Connections:  Not on file  Intimate Partner Violence: Not on file   Family Status  Relation Name Status   Mother  Deceased at age 21   Father  Deceased       no info   Brother  Audiological scientist  (Not Specified)   Sister  (Not Specified)   Family History  Problem Relation Age of Onset   Heart attack Mother 76   Heart failure Brother 40   Stroke Sister       Review of Systems  Constitutional:  Negative for chills, diaphoresis, fever, malaise/fatigue and weight loss.  HENT:  Negative for congestion, ear discharge, ear pain, hearing loss, nosebleeds, sore throat and tinnitus.        Dental pain  Eyes:  Negative for blurred vision, double vision, photophobia, discharge and redness.  Respiratory:  Negative for cough, hemoptysis, sputum production, shortness of breath, wheezing and stridor.        No excess mucus  Cardiovascular:  Negative for chest pain, palpitations, orthopnea, claudication, leg swelling and PND.  Gastrointestinal:  Negative for abdominal pain, blood in stool, constipation, diarrhea, heartburn, melena, nausea and vomiting.  Genitourinary:  Negative for dysuria, flank pain, frequency, hematuria and urgency.  Musculoskeletal:  Negative for back pain, falls, joint pain, myalgias and neck pain.  Skin:  Negative for itching and rash.  Neurological:  Negative for dizziness, tingling, tremors, sensory change, speech change, focal weakness, seizures, loss of consciousness, weakness and headaches.  Endo/Heme/Allergies:  Negative for environmental allergies and polydipsia. Does not bruise/bleed easily.  Psychiatric/Behavioral:  Negative for depression, hallucinations, memory loss, substance abuse and suicidal ideas. The patient is not nervous/anxious and does not have insomnia.   All other systems reviewed and are negative.     Objective:     BP 110/69 (BP Location: Left Arm, Patient Position: Sitting, Cuff Size: Large)   Pulse 74   Ht 6\' 3"  (1.905 m)   Wt 283 lb (128.4 kg)   SpO2  98%   BMI 35.37 kg/m  BP Readings from Last 3 Encounters:  07/04/22 110/69  06/07/22 113/72  05/09/22 (!) 153/88  Wt Readings from Last 3 Encounters:  07/04/22 283 lb (128.4 kg)  05/09/22 282 lb 3.2 oz (128 kg)  02/27/22 270 lb (122.5 kg)      Physical Exam Vitals reviewed.  Constitutional:      Appearance: Normal appearance. He is well-developed. He is obese. He is not diaphoretic.  HENT:     Head: Normocephalic and atraumatic.     Nose: No nasal deformity, septal deviation, mucosal edema or rhinorrhea.     Right Sinus: No maxillary sinus tenderness or frontal sinus tenderness.     Left Sinus: No maxillary sinus tenderness or frontal sinus tenderness.     Mouth/Throat:     Pharynx: No oropharyngeal exudate.     Comments: Fractured first molar lower jaw and gingivitis Eyes:     General: No scleral icterus.    Conjunctiva/sclera: Conjunctivae normal.     Pupils: Pupils are equal, round, and reactive to light.  Neck:     Thyroid: No thyromegaly.     Vascular: No carotid bruit or JVD.     Trachea: Trachea normal. No tracheal tenderness or tracheal deviation.  Cardiovascular:     Rate and Rhythm: Normal rate and regular rhythm.     Chest Wall: PMI is not displaced.     Pulses: Normal pulses. No decreased pulses.     Heart sounds: Normal heart sounds, S1 normal and S2 normal. Heart sounds not distant. No murmur heard.    No systolic murmur is present.     No diastolic murmur is present.     No friction rub. No gallop. No S3 or S4 sounds.  Pulmonary:     Effort: No tachypnea, accessory muscle usage or respiratory distress.     Breath sounds: No stridor. No decreased breath sounds, wheezing, rhonchi or rales.  Chest:     Chest wall: No tenderness.  Abdominal:     General: Bowel sounds are normal. There is no distension.     Palpations: Abdomen is soft. Abdomen is not rigid.     Tenderness: There is no abdominal tenderness. There is no guarding or rebound.   Musculoskeletal:        General: Normal range of motion.     Cervical back: Normal range of motion and neck supple. No edema, erythema or rigidity. No muscular tenderness. Normal range of motion.  Lymphadenopathy:     Head:     Right side of head: No submental or submandibular adenopathy.     Left side of head: No submental or submandibular adenopathy.     Cervical: No cervical adenopathy.  Skin:    General: Skin is warm and dry.     Coloration: Skin is not pale.     Findings: No rash.     Nails: There is no clubbing.  Neurological:     Mental Status: He is alert and oriented to person, place, and time.     Sensory: No sensory deficit.  Psychiatric:        Speech: Speech normal.        Behavior: Behavior normal.      Results for orders placed or performed in visit on 07/04/22  POCT glucose (manual entry)  Result Value Ref Range   POC Glucose 161 (A) 70 - 99 mg/dl    Last CBC Lab Results  Component Value Date   WBC 8.6 10/29/2021   HGB 12.3 (L) 10/29/2021   HCT 36.1 (L) 10/29/2021   MCV 74 (L) 10/29/2021   MCH 25.2 (  L) 10/29/2021   RDW 14.3 10/29/2021   PLT 376 10/29/2021   Last metabolic panel Lab Results  Component Value Date   GLUCOSE 95 05/09/2022   NA 141 05/09/2022   K 4.2 05/09/2022   CL 105 05/09/2022   CO2 23 05/09/2022   BUN 18 05/09/2022   CREATININE 1.36 (H) 05/09/2022   EGFR 62 05/09/2022   CALCIUM 9.0 05/09/2022   PROT 7.1 12/13/2021   ALBUMIN 4.3 12/13/2021   LABGLOB 2.8 12/13/2021   AGRATIO 1.5 12/13/2021   BILITOT 0.3 12/13/2021   ALKPHOS 45 12/13/2021   AST 16 12/13/2021   ALT 15 12/13/2021   ANIONGAP 14 10/16/2021   Last lipids Lab Results  Component Value Date   CHOL 172 05/09/2022   HDL 44 05/09/2022   LDLCALC 102 (H) 05/09/2022   TRIG 150 (H) 05/09/2022   CHOLHDL 3.9 05/09/2022   Last hemoglobin A1c Lab Results  Component Value Date   HGBA1C 8.3 (A) 05/09/2022   Last thyroid functions Lab Results  Component Value  Date   TSH 1.520 10/11/2020   T4TOTAL 7.0 10/11/2020      The 10-year ASCVD risk score (Arnett DK, et al., 2019) is: 23.8%    Assessment & Plan:   Problem List Items Addressed This Visit       Cardiovascular and Mediastinum   Essential hypertension    Blood pressure well-controlled no change in medication        Digestive   Dental abscess    Referral to dentistry        Endocrine   Hyperlipidemia due to type 2 diabetes mellitus (HCC)    At goal no change in medication      Relevant Orders   Lipid panel   Diabetes mellitus (HCC) - Primary    Increase in A1c will monitor for now no change in medication increase lifestyle management The following Lifestyle Medicine recommendations according to American College of Lifestyle Medicine [facility]) were discussed and offered to patient who agrees to start the journey:  A. Whole Foods, Plant-based plate comprising of fruits and vegetables, plant-based proteins, whole-grain carbohydrates was discussed in detail with the patient.   A list for source of those nutrients were also provided to the patient.  Patient will use only water or unsweetened tea for hydration. B.  The need to stay away from risky substances including alcohol, smoking; obtaining 7 to 9 hours of restorative sleep, at least 150 minutes of moderate intensity exercise weekly, the importance of healthy social connections,  and stress reduction techniques were discussed. C.  A full color page of  Calorie density of various food groups per pound showing examples of each food groups was provided to the patient.       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     Shan Levans, MD

## 2022-07-04 NOTE — Patient Instructions (Signed)
No change in medications you have plenty of refills  Labs today will be A1c for your sugar and metabolic panel and cholesterol  Use Biotene rinse for your dry mouth  Excellent job on your smoking cessation  Return to Dr. Delford Field 6 months

## 2022-07-04 NOTE — Progress Notes (Signed)
Dry mouth

## 2022-07-04 NOTE — Assessment & Plan Note (Addendum)
Increase in A1c will monitor for now no change in medication increase lifestyle management The following Lifestyle Medicine recommendations according to American College of Lifestyle Medicine [facility]) were discussed and offered to patient who agrees to start the journey:  A. Whole Foods, Plant-based plate comprising of fruits and vegetables, plant-based proteins, whole-grain carbohydrates was discussed in detail with the patient.   A list for source of those nutrients were also provided to the patient.  Patient will use only water or unsweetened tea for hydration. B.  The need to stay away from risky substances including alcohol, smoking; obtaining 7 to 9 hours of restorative sleep, at least 150 minutes of moderate intensity exercise weekly, the importance of healthy social connections,  and stress reduction techniques were discussed. C.  A full color page of  Calorie density of various food groups per pound showing examples of each food groups was provided to the patient.

## 2022-07-04 NOTE — Assessment & Plan Note (Signed)
At goal no change in medication 

## 2022-07-04 NOTE — Assessment & Plan Note (Signed)
Blood pressure well controlled no change in medication 

## 2022-07-05 ENCOUNTER — Telehealth: Payer: Self-pay

## 2022-07-05 LAB — LIPID PANEL
Chol/HDL Ratio: 4.1 ratio (ref 0.0–5.0)
Cholesterol, Total: 162 mg/dL (ref 100–199)
HDL: 40 mg/dL (ref >39)
LDL Chol Calc (NIH): 94 mg/dL (ref 0–99)
Triglycerides: 159 mg/dL — ABNORMAL HIGH (ref 0–149)
VLDL Cholesterol Cal: 28 mg/dL (ref 5–40)

## 2022-07-05 LAB — HEMOGLOBIN A1C
Est. average glucose Bld gHb Est-mCnc: 194 mg/dL
Hgb A1c MFr Bld: 8.4 % — ABNORMAL HIGH (ref 4.8–5.6)

## 2022-07-05 NOTE — Telephone Encounter (Signed)
Pt was called and is aware of results, DOB was confirmed.  ?

## 2022-07-05 NOTE — Telephone Encounter (Signed)
-----   Message from Storm Frisk, MD sent at 07/05/2022  8:50 AM EDT ----- Let pt know A1c still high, stay on medications as prescribed, luke pls see him in fu

## 2022-07-05 NOTE — Progress Notes (Signed)
Let pt know A1c still high, stay on medications as prescribed, luke pls see him in fu

## 2022-07-11 ENCOUNTER — Other Ambulatory Visit: Payer: Self-pay | Admitting: Podiatry

## 2022-07-12 ENCOUNTER — Other Ambulatory Visit: Payer: Self-pay

## 2022-07-12 ENCOUNTER — Other Ambulatory Visit (HOSPITAL_COMMUNITY): Payer: Self-pay

## 2022-07-12 NOTE — Telephone Encounter (Signed)
Refill request from pharmacy for doxycycline 100mg , BID, #28 tabs.  Patient will need to call for f/u appointment with Dr. Logan Bores for antibiotic renewal

## 2022-07-19 ENCOUNTER — Other Ambulatory Visit (HOSPITAL_COMMUNITY): Payer: Self-pay

## 2022-07-22 ENCOUNTER — Ambulatory Visit (INDEPENDENT_AMBULATORY_CARE_PROVIDER_SITE_OTHER): Payer: BC Managed Care – PPO | Admitting: Podiatry

## 2022-07-22 ENCOUNTER — Encounter: Payer: Self-pay | Admitting: Podiatry

## 2022-07-22 VITALS — Ht 75.0 in

## 2022-07-22 DIAGNOSIS — E0843 Diabetes mellitus due to underlying condition with diabetic autonomic (poly)neuropathy: Secondary | ICD-10-CM

## 2022-07-22 DIAGNOSIS — L97512 Non-pressure chronic ulcer of other part of right foot with fat layer exposed: Secondary | ICD-10-CM

## 2022-07-22 DIAGNOSIS — M25871 Other specified joint disorders, right ankle and foot: Secondary | ICD-10-CM | POA: Diagnosis not present

## 2022-07-22 NOTE — Progress Notes (Signed)
Chief Complaint  Patient presents with   Foot Pain    Still has foot pain    Subjective: 57 y.o. male PMHx diabetes mellitus presenting to the office today for follow-up evaluation of a a symptomatic callus to the plantar first MTP of the right foot.  Originally the patient was scheduled for fibular sesamoid excision to offload pressure from the preulcerative callus lesion but the co-pay was given to be too expensive initially.  He is hoping to have the surgery later this summer.  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) 05/09/2022    Past Surgical History:  Procedure Laterality Date   FOOT SURGERY Left    LEFT HEART CATHETERIZATION WITH CORONARY ANGIOGRAM N/A 11/29/2013   Procedure: LEFT HEART CATHETERIZATION WITH CORONARY ANGIOGRAM;  Surgeon: Lennette Bihari, MD;  [facility] C[facility] LAB;  Service: Cardiovascular;  Laterality: N/A;   None      Allergies  Allergen Reactions   Trulicity [Dulaglutide] Other (See Comments)    Abdominal pain    RT foot 07/22/2022 predebridement  Objective:  Physical Exam General: Alert and oriented x3 in no acute distress  Dermatology: After debridement of the hyperkeratotic callus to the plantar aspect of the first MTP there is an underlying ulcer.  Ulcer measures approximately 2.0 x 2.0 x 0.2 cm.  Granular wound base.  There is maceration within the surrounding soft tissue.  Mild malodor.  Minimal erythema especially to the medial aspect of the first MTP  Vascular: VAS [place] ABI W/WO TBI 10/16/2021 ABI Findings:  +---------+------------------+-----+---------+-----------------------------  ----+  Right   Rt  Pressure (mmHg)IndexWaveform Comment                             +---------+------------------+-----+---------+-----------------------------  ----+  Brachial 103                    triphasic                                    +---------+------------------+-----+---------+-----------------------------  ----+  PTA     102               0.96 triphasic                                    +---------+------------------+-----+---------+-----------------------------  ----+  DP      99                0.93 biphasic                                     +---------+------------------+-----+---------+-----------------------------  ----+  Great Toe                                unable to obtain right great  toe  pressure due to ulceration          +---------+------------------+-----+---------+-----------------------------  ----+   +---------+------------------+-----+---------+-------+  Left    Lt Pressure (mmHg)IndexWaveform Comment  +---------+------------------+-----+---------+-------+  Brachial 106                    triphasic         +---------+------------------+-----+---------+-------+  PTA     122               1.15 triphasic         +---------+------------------+-----+---------+-------+  DP      124               1.17 triphasic         +---------+------------------+-----+---------+-------+  Great Toe86                0.81 Normal            +---------+------------------+-----+---------+-------+  Summary:  Right: Resting right ankle-brachial index is within normal range.   Unable to obtain right great toe pressure due to ulceration.  Left: Resting left ankle-brachial index is within normal range. The left  toe-brachial index is normal.   Neurological: Epicritic and protective threshold grossly intact bilaterally.   Musculoskeletal Exam: Significant tenderness with palpation  directly related to the fibular sesamoid of the right foot first MTP.  No pain with palpation to the tibial sesamoid  Radiographic exam RT foot 03/08/2022: Radiographic marker noted associated closely to the fibular sesamoid of the first MTP right foot.  No acute fractures identified.  Joint spaces mostly preserved.  MR FOOT RIGHT WO CONTRAST 10/15/2021: IMPRESSION: 1. No acute osteomyelitis of the right forefoot. 2. Skin thickening with superficial wound or ulceration along the plantar aspect of the distal forefoot near the first MTP joint level. Generalized soft tissue edema without organized fluid collection.  Assessment: 1.  Ulcer plantar aspect of the first MTP right foot secondary to diabetes mellitus 2.  Chronic fibular sesamoiditis right 3.  Pain due to onychomycosis of toenails both 4.  Localized cellulitis first MTP right  -Preauthorization for surgery was initiated today.  Surgery will consist of fibular sesamoidectomy of the right foot.  Debridement of ulcer right foot.  Patient was planning to have surgery towards the end of the summer.  Risk benefits advantages and disadvantages of the procedure were explained again in detail to the patient.  No guarantees were expressed or implied.  Patient agrees and is ready to proceed with surgery. -Medically necessary excisional debridement including subcutaneous tissue was performed using a tissue nipper.  Excisional debridement of the necrotic nonviable tissue down to healthier bleeding viable tissue was performed with postdebridement measurement same as pre- -continue Betadine and a dressing daily -Return to clinic 1 week postop  Felecia Shelling, DPM [facility]  Dr. Felecia Shelling, DPM    [address] [place], [place] [address]                Office [phone]  Fax [phone]

## 2022-07-26 ENCOUNTER — Telehealth: Payer: Self-pay | Admitting: Urology

## 2022-07-26 NOTE — Telephone Encounter (Signed)
DOS - 08/29/22  SESAMOIDECTOMY RIGHT --- 40981 DEBRIDEMENT ULCER RIGHT --- 11043  [employer] EFFECTIVE DATE - 02/18/22   DEDUCTIBLE - $2,500.00 W/ $1,817.37 REMAINING OOP - $6,350.00 W/ $4,910.66 REMAINING COINSURANCE - 0%   SPOKE WITH NICOLE G. WITH [employer] AND SHE STATED THAT FOR CPT CODES 19147 AND 11043 NO PRIOR AUTH IS REQUIRED.   CALL REF # [id number]

## 2022-07-30 ENCOUNTER — Other Ambulatory Visit: Payer: Self-pay

## 2022-07-31 ENCOUNTER — Other Ambulatory Visit: Payer: Self-pay

## 2022-07-31 ENCOUNTER — Other Ambulatory Visit (HOSPITAL_COMMUNITY): Payer: Self-pay

## 2022-08-02 ENCOUNTER — Other Ambulatory Visit: Payer: Self-pay

## 2022-08-05 ENCOUNTER — Other Ambulatory Visit: Payer: Self-pay

## 2022-08-05 ENCOUNTER — Other Ambulatory Visit (HOSPITAL_COMMUNITY): Payer: Self-pay

## 2022-08-06 ENCOUNTER — Other Ambulatory Visit: Payer: Self-pay

## 2022-08-08 ENCOUNTER — Ambulatory Visit: Payer: BC Managed Care – PPO | Attending: Family Medicine | Admitting: Pharmacist

## 2022-08-08 ENCOUNTER — Other Ambulatory Visit: Payer: Self-pay

## 2022-08-08 ENCOUNTER — Encounter: Payer: Self-pay | Admitting: Pharmacist

## 2022-08-08 DIAGNOSIS — Z7985 Long-term (current) use of injectable non-insulin antidiabetic drugs: Secondary | ICD-10-CM

## 2022-08-08 DIAGNOSIS — E1165 Type 2 diabetes mellitus with hyperglycemia: Secondary | ICD-10-CM

## 2022-08-08 NOTE — Progress Notes (Signed)
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 PMH is significant for HTN, HLD, T2DM, and obesity.   Patient was referred and last seen by Primary Care Provider, Dr. Delford Field, on 07/04/2022. Of note, clinical pharmacy last saw him 07/04/2022. At his visit with Dr. Delford Field, pt's A1c was 8.4%. Of note, patient recently completed antibiotic therapy for a tooth abscess. In addition, he was treated last month for a R foot ulcer. Completed 14 days of antibiotics.  Podiatry is planning for OP fibular sesamoidectomy and debridement of ulcer right foot next month.   Today, patient arrives in good spirits and presents without any assistance. He has an intolerance to GLP-1 RA therapy and metformin. He is fully adherent to his insulin and [place]. Denies any side effects today. Of note, he thinks his sugars are improved since increasing Toujeo from 30 to 40u daily. Denies any complaints today.   Family/Social History:  Fhx: MI, CHF, stroke Tobacco: smokes about 1 cigarette a day.  Alcohol: none reported   Current diabetes medications include: Farxiga 10 mg daily, Toujeo 40 units once daily   Patient reports adherence to taking all medications as prescribed.  Insurance coverage: BCBS  Patient reports hypoglycemic events.  Reported home fasting blood sugars: 140s-150s Denies hypoglycemic events.   Patient endorses nocturia (nighttime urination).  Patient denies neuropathy (nerve pain). Patient denies visual changes. Patient reports self foot exams.   Patient reported dietary habits:  -Pt admits to dietary indiscretion -Continues to struggle with this.  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 Admits to eating cake on Sundays    Patient-reported exercise habits:  -No formal regimen outside of work.  O:    Lab Results  Component Value Date   HGBA1C 8.4 (H) 07/04/2022   There were no vitals filed for this visit.   Lipid Panel      Component Value Date/Time   CHOL 162 07/04/2022 1106   TRIG 159 (H) 07/04/2022 1106   HDL 40 07/04/2022 1106   CHOLHDL 4.1 07/04/2022 1106   CHOLHDL 7.1 11/25/2013 1639   VLDL 20 11/25/2013 1639   LDLCALC 94 07/04/2022 1106    Clinical Atherosclerotic Cardiovascular Disease (ASCVD): No  The 10-year ASCVD risk score (Arnett DK, et al., 2019) is: 24.1%   Values used to calculate the score:     Age: 77 years     Sex: Male     Is Non-Hispanic African American: Yes     Diabetic: Yes     Tobacco smoker: Yes     Systolic Blood Pressure: 110 mmHg     Is BP treated: Yes     HDL Cholesterol: 40 mg/dL     Total Cholesterol: 162 mg/dL   A/P: Diabetes longstanding currently above goal based on A1c. His reported home sugars show improvement since his last PCP visit. Patient is able to verbalize appropriate hypoglycemia management plan. Medication adherence appears appropriate.  -Increase Toujeo to 44 units daily. Patient instructed to send a MyChart message or call if blood sugars continue to be above 150 - 200. We will continue to titrate insulin and may need to include bolus. His recent infective processes have contributed to hyperglycemia.  -Continued Farxiga 10mg  once daily  -Extensively discussed pathophysiology of diabetes, recommended lifestyle interventions, dietary effects on blood sugar control.  -Counseled on s/sx of and management of hypoglycemia.  -Next A1c anticipated 09/2022.   Written patient instructions  provided. Patient verbalized understanding of treatment plan.  Total time in face to face counseling 20 minutes.    Follow-up:  Pharmacist in 2 months PCP clinic visit in November.  Butch Penny, PharmD, [place], CPP Clinical Pharmacist [facility] [phone]

## 2022-08-12 ENCOUNTER — Other Ambulatory Visit: Payer: Self-pay

## 2022-08-12 ENCOUNTER — Other Ambulatory Visit (HOSPITAL_COMMUNITY): Payer: Self-pay

## 2022-08-14 ENCOUNTER — Other Ambulatory Visit: Payer: Self-pay

## 2022-08-15 ENCOUNTER — Other Ambulatory Visit (HOSPITAL_COMMUNITY): Payer: Self-pay

## 2022-08-16 ENCOUNTER — Other Ambulatory Visit (HOSPITAL_COMMUNITY): Payer: Self-pay

## 2022-08-16 ENCOUNTER — Other Ambulatory Visit: Payer: Self-pay

## 2022-08-19 ENCOUNTER — Other Ambulatory Visit: Payer: Self-pay

## 2022-08-19 ENCOUNTER — Ambulatory Visit (INDEPENDENT_AMBULATORY_CARE_PROVIDER_SITE_OTHER): Payer: BC Managed Care – PPO | Admitting: Podiatry

## 2022-08-19 DIAGNOSIS — M25871 Other specified joint disorders, right ankle and foot: Secondary | ICD-10-CM

## 2022-08-19 DIAGNOSIS — E0843 Diabetes mellitus due to underlying condition with diabetic autonomic (poly)neuropathy: Secondary | ICD-10-CM

## 2022-08-19 DIAGNOSIS — L03115 Cellulitis of right lower limb: Secondary | ICD-10-CM

## 2022-08-19 DIAGNOSIS — L97512 Non-pressure chronic ulcer of other part of right foot with fat layer exposed: Secondary | ICD-10-CM

## 2022-08-19 DIAGNOSIS — L84 Corns and callosities: Secondary | ICD-10-CM

## 2022-08-19 NOTE — Progress Notes (Signed)
Patient presents today to be measured for  diabetic insoles.  Patient was measured for 3 pairs of foam casted diabetic insoles.  Patient wanted steel toe boots and we do not carry them , he is going to order them seperately only wants the inserts.  Ht 6'3 Wt 286  Shoe size 12 Shoe type steel toe boots   Patient scanned with [employer] for diabetic inserts , holding paperwork and order until we get predetermination back from [employer] paperwork signed.   Re-appointment for regularly scheduled diabetic foot care visits or if they should experience any trouble with the shoes or insoles.

## 2022-08-29 ENCOUNTER — Other Ambulatory Visit: Payer: Self-pay | Admitting: Podiatry

## 2022-08-29 ENCOUNTER — Other Ambulatory Visit: Payer: Self-pay

## 2022-08-29 ENCOUNTER — Encounter: Payer: Self-pay | Admitting: *Deleted

## 2022-08-29 DIAGNOSIS — L97512 Non-pressure chronic ulcer of other part of right foot with fat layer exposed: Secondary | ICD-10-CM | POA: Diagnosis not present

## 2022-08-29 DIAGNOSIS — M84871 Other disorders of continuity of bone, right ankle and foot: Secondary | ICD-10-CM | POA: Diagnosis not present

## 2022-08-29 DIAGNOSIS — M25872 Other specified joint disorders, left ankle and foot: Secondary | ICD-10-CM | POA: Diagnosis not present

## 2022-08-29 DIAGNOSIS — L97515 Non-pressure chronic ulcer of other part of right foot with muscle involvement without evidence of necrosis: Secondary | ICD-10-CM | POA: Diagnosis not present

## 2022-08-29 DIAGNOSIS — L97519 Non-pressure chronic ulcer of other part of right foot with unspecified severity: Secondary | ICD-10-CM | POA: Diagnosis not present

## 2022-08-29 MED ORDER — DOXYCYCLINE HYCLATE 100 MG PO TABS
100.0000 mg | ORAL_TABLET | Freq: Two times a day (BID) | ORAL | 0 refills | Status: DC
  Filled 2022-08-29 (×2): qty 28, 14d supply, fill #0

## 2022-08-29 MED ORDER — OXYCODONE-ACETAMINOPHEN 10-325 MG PO TABS
1.0000 | ORAL_TABLET | ORAL | 0 refills | Status: DC | PRN
  Filled 2022-08-29 (×2): qty 20, 7d supply, fill #0

## 2022-08-29 NOTE — Progress Notes (Signed)
PRN postop 

## 2022-09-02 ENCOUNTER — Telehealth: Payer: BC Managed Care – PPO | Admitting: Podiatry

## 2022-09-02 NOTE — Telephone Encounter (Signed)
Would like to know if insurance is going to cover shoes or not. If you can callback and advise

## 2022-09-02 NOTE — Telephone Encounter (Signed)
Received auth from insurance it is in [place]. I let the patient know that they did authorize the insoles, he said he wanted to proceed with them I will place the order today.

## 2022-09-04 ENCOUNTER — Ambulatory Visit (INDEPENDENT_AMBULATORY_CARE_PROVIDER_SITE_OTHER): Payer: BC Managed Care – PPO

## 2022-09-04 ENCOUNTER — Other Ambulatory Visit: Payer: Self-pay

## 2022-09-04 ENCOUNTER — Ambulatory Visit (INDEPENDENT_AMBULATORY_CARE_PROVIDER_SITE_OTHER): Payer: BC Managed Care – PPO | Admitting: Podiatry

## 2022-09-04 ENCOUNTER — Other Ambulatory Visit (HOSPITAL_COMMUNITY): Payer: Self-pay

## 2022-09-04 DIAGNOSIS — Z9889 Other specified postprocedural states: Secondary | ICD-10-CM

## 2022-09-04 MED ORDER — OXYCODONE-ACETAMINOPHEN 10-325 MG PO TABS
1.0000 | ORAL_TABLET | ORAL | 0 refills | Status: DC | PRN
  Filled 2022-09-04 (×2): qty 20, 4d supply, fill #0

## 2022-09-04 NOTE — Progress Notes (Signed)
   Chief Complaint  Patient presents with   Routine Post Op    POV #1 DOS 08/29/2022 FIBULAR SESAMOIDECTOMY RT, DEBRIDEMENT OF ULCER RT FOOT, patient denies nay N/V/F/C/SOB, rate of pain 9 out of 10, X-Rays done today, A1c- 8.4    Subjective:  Patient presents today status post fibular sesamoidectomy of the right foot.  DOS: 08/29/2022.  Patient doing well.  WBAT in the short cam walker.  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  Incision well coapted with sutures intact. No sign of infectious process noted. No dehiscence. No active bleeding noted.  No appreciable edema noted  Radiographic Exam RT foot 09/04/2022:  Absence of the fibular sesamoid noted.  Impression: Interval resection of the fibular sesamoid right  Assessment: 1. s/p sesamoidectomy right. DOS: 08/29/2022   Plan of Care:  -Patient was evaluated. X-rays reviewed - Dressings changed.  Recommend antibiotic ointment and a Band-Aid over the incision site daily -Continue WBAT short cam walker -Return to clinic 2-3 weeks suture removal  Felecia Shelling, DPM [facility]  Dr. Felecia Shelling, DPM     [address] [place], [place] [address]                Office [phone]  Fax [phone]

## 2022-09-05 ENCOUNTER — Other Ambulatory Visit: Payer: Self-pay

## 2022-09-06 ENCOUNTER — Other Ambulatory Visit: Payer: Self-pay

## 2022-09-11 ENCOUNTER — Encounter: Payer: BC Managed Care – PPO | Admitting: Podiatry

## 2022-09-16 ENCOUNTER — Telehealth: Payer: Self-pay | Admitting: Podiatry

## 2022-09-16 ENCOUNTER — Other Ambulatory Visit: Payer: Self-pay

## 2022-09-16 ENCOUNTER — Other Ambulatory Visit: Payer: Self-pay | Admitting: Podiatry

## 2022-09-16 NOTE — Telephone Encounter (Signed)
Pt calling requesting a refill on Rx Oxycodone 10mg .    Please advise

## 2022-09-17 ENCOUNTER — Other Ambulatory Visit: Payer: Self-pay | Admitting: Podiatry

## 2022-09-17 ENCOUNTER — Other Ambulatory Visit: Payer: Self-pay

## 2022-09-17 MED ORDER — OXYCODONE-ACETAMINOPHEN 10-325 MG PO TABS
1.0000 | ORAL_TABLET | ORAL | 0 refills | Status: DC | PRN
  Filled 2022-09-17 – 2022-09-18 (×2): qty 20, 4d supply, fill #0

## 2022-09-17 NOTE — Telephone Encounter (Addendum)
Pt called again asking for a refill on his pain medication to please be sent in.  Pt called again needing his pain medication sent in. 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

## 2022-09-18 ENCOUNTER — Other Ambulatory Visit: Payer: Self-pay

## 2022-09-18 NOTE — Telephone Encounter (Signed)
Notified pt medication was sent in yesterday afternoon.he said thank you.

## 2022-09-23 ENCOUNTER — Other Ambulatory Visit: Payer: Self-pay

## 2022-09-25 ENCOUNTER — Other Ambulatory Visit: Payer: Self-pay

## 2022-09-25 ENCOUNTER — Ambulatory Visit (INDEPENDENT_AMBULATORY_CARE_PROVIDER_SITE_OTHER): Payer: BC Managed Care – PPO | Admitting: Podiatry

## 2022-09-25 ENCOUNTER — Telehealth: Payer: Self-pay | Admitting: Podiatry

## 2022-09-25 DIAGNOSIS — E0843 Diabetes mellitus due to underlying condition with diabetic autonomic (poly)neuropathy: Secondary | ICD-10-CM

## 2022-09-25 DIAGNOSIS — Z9889 Other specified postprocedural states: Secondary | ICD-10-CM

## 2022-09-25 DIAGNOSIS — L97512 Non-pressure chronic ulcer of other part of right foot with fat layer exposed: Secondary | ICD-10-CM

## 2022-09-25 DIAGNOSIS — E1140 Type 2 diabetes mellitus with diabetic neuropathy, unspecified: Secondary | ICD-10-CM

## 2022-09-25 MED ORDER — OXYCODONE-ACETAMINOPHEN 10-325 MG PO TABS
1.0000 | ORAL_TABLET | ORAL | 0 refills | Status: DC | PRN
  Filled 2022-09-25 (×2): qty 20, 4d supply, fill #0

## 2022-09-25 NOTE — Progress Notes (Signed)
   No chief complaint on file.   Subjective:  Patient presents today status post fibular sesamoidectomy of the right foot.  DOS: 08/29/2022.  Patient doing well.  WBAT in the short cam walker.  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  Incision well coapted with sutures intact. No sign of infectious process noted. No dehiscence. No active bleeding noted.  No appreciable edema noted  Radiographic Exam RT foot 09/04/2022:  Absence of the fibular sesamoid noted.  Impression: Interval resection of the fibular sesamoid right  Assessment: 1. s/p sesamoidectomy right. DOS: 08/29/2022   Plan of Care:  -Patient was evaluated.  -Sutures removed -Patient may now transition out of the cam boot into good supportive tennis shoes and sneakers -Diabetic insoles were dispensed today with break-in and care instructions provided -Return to clinic 2 weeks.  Patient scheduled to return to work 10/14/2022  Felecia Shelling, DPM [facility]  Dr. Felecia Shelling, DPM    [address] [place], [place] [address]                Office [phone]   Fax [phone]

## 2022-09-25 NOTE — Telephone Encounter (Signed)
Pt stated that the pharmacy has not received the Rx for pain. Please advise

## 2022-10-01 ENCOUNTER — Encounter: Payer: Self-pay | Admitting: Oncology

## 2022-10-03 ENCOUNTER — Telehealth: Payer: Self-pay | Admitting: Podiatry

## 2022-10-03 NOTE — Telephone Encounter (Signed)
 Pt wants refill on pain medication

## 2022-10-04 ENCOUNTER — Other Ambulatory Visit: Payer: Self-pay | Admitting: Podiatry

## 2022-10-04 MED ORDER — OXYCODONE-ACETAMINOPHEN 10-325 MG PO TABS
1.0000 | ORAL_TABLET | Freq: Three times a day (TID) | ORAL | 0 refills | Status: DC | PRN
  Filled 2022-10-04 – 2022-10-07 (×2): qty 21, 7d supply, fill #0

## 2022-10-04 NOTE — Progress Notes (Signed)
PRN postop pain 

## 2022-10-07 ENCOUNTER — Other Ambulatory Visit: Payer: Self-pay | Admitting: Podiatry

## 2022-10-07 ENCOUNTER — Other Ambulatory Visit: Payer: Self-pay

## 2022-10-07 ENCOUNTER — Telehealth: Payer: Self-pay | Admitting: Podiatry

## 2022-10-07 NOTE — Telephone Encounter (Signed)
Pt called again for a refill on pain medication

## 2022-10-08 ENCOUNTER — Encounter: Payer: Self-pay | Admitting: Pharmacist

## 2022-10-08 ENCOUNTER — Other Ambulatory Visit: Payer: Self-pay

## 2022-10-08 ENCOUNTER — Ambulatory Visit: Payer: BC Managed Care – PPO | Attending: Critical Care Medicine | Admitting: Pharmacist

## 2022-10-08 ENCOUNTER — Encounter: Payer: Self-pay | Admitting: Oncology

## 2022-10-08 DIAGNOSIS — E1165 Type 2 diabetes mellitus with hyperglycemia: Secondary | ICD-10-CM

## 2022-10-08 DIAGNOSIS — E1169 Type 2 diabetes mellitus with other specified complication: Secondary | ICD-10-CM

## 2022-10-08 DIAGNOSIS — Z794 Long term (current) use of insulin: Secondary | ICD-10-CM | POA: Diagnosis not present

## 2022-10-08 DIAGNOSIS — Z7984 Long term (current) use of oral hypoglycemic drugs: Secondary | ICD-10-CM | POA: Diagnosis not present

## 2022-10-08 DIAGNOSIS — E785 Hyperlipidemia, unspecified: Secondary | ICD-10-CM

## 2022-10-08 LAB — POCT GLYCOSYLATED HEMOGLOBIN (HGB A1C): HbA1c, POC (controlled diabetic range): 8.2 % — AB (ref 0.0–7.0)

## 2022-10-08 MED ORDER — ATORVASTATIN CALCIUM 80 MG PO TABS
80.0000 mg | ORAL_TABLET | Freq: Every day | ORAL | 3 refills | Status: DC
Start: 2022-10-08 — End: 2023-07-30
  Filled 2022-10-08 – 2022-10-23 (×3): qty 90, 90d supply, fill #0
  Filled 2023-01-23: qty 90, 90d supply, fill #1
  Filled 2023-04-03: qty 90, 90d supply, fill #2
  Filled 2023-05-19 – 2023-07-22 (×2): qty 90, 90d supply, fill #3

## 2022-10-08 MED ORDER — FREESTYLE LIBRE 2 READER DEVI
0 refills | Status: DC
  Filled 2022-10-08: qty 1, fill #0

## 2022-10-08 MED ORDER — OZEMPIC (0.25 OR 0.5 MG/DOSE) 2 MG/3ML ~~LOC~~ SOPN
0.2500 mg | PEN_INJECTOR | SUBCUTANEOUS | 1 refills | Status: DC
Start: 2022-10-08 — End: 2022-11-14
  Filled 2022-10-08: qty 3, fill #0
  Filled 2022-10-08 – 2022-10-23 (×2): qty 3, 42d supply, fill #0

## 2022-10-08 MED ORDER — FREESTYLE LIBRE 2 SENSOR MISC
6 refills | Status: DC
  Filled 2022-10-08: qty 2, 28d supply, fill #0
  Filled 2022-10-08: qty 2, fill #0
  Filled 2022-10-23: qty 2, 28d supply, fill #0
  Filled 2022-10-29: qty 1, 14d supply, fill #1
  Filled 2022-11-11 – 2022-11-13 (×2): qty 2, 28d supply, fill #1
  Filled 2023-04-04: qty 1, 14d supply, fill #2
  Filled 2023-04-04: qty 1, 14d supply, fill #3

## 2022-10-08 NOTE — Progress Notes (Signed)
S:     No chief complaint on file.  57 y.o. male who presents for diabetes evaluation, education, and management.   Patient was referred and last seen by Primary Care Provider, Dr. Delford Field, on 07/04/22.  Patient was last seen by Pharmacy for T2DM with Franky Macho on 08/08/22.   PMH is significant for HTN, T2DM, obesity, tobacco use, HLD.  At last visit, A1c was 8.4, slightly increased from 8.3 in March 2024. He reported adherence to his insulin and Farxiga regimen, but still reported some symptoms of highs & lows. Toujeo was increased from 40 to 44 units (although this visit he reports using 42 units daily).    Family/Social History: MI, CHF, stroke Tobacco: smokes about 1 cigarette per day. 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 His sister has been in the hospital for the past few weeks and he has struggled to stay consistent with the timing of his Toujeo injection. He works third shift (overnight) and takes his insulin when he gets home from work in the morning before he goes to bed.   To note, he has an intolerance to Trulicity listed on his profile, but is convinced that metformin was the culprit for the GI side effects he experienced, as he was started on both at the same time. He was interested in learning more about GLP-1s and mentioned weight loss being a goal. Reported about 10-12 pounds lost on Trulicity in the past.   Do you feel that your medications are working for you? yes Have you been experiencing any side effects to the medications prescribed? no Do you have any problems  obtaining medications due to transportation or finances? no  Insurance coverage: BCBS  Patient denies hypoglycemic events. Reported random blood sugars: 135, 145. Hasn't checked in over 2 weeks.  Patient reports nocturia (nighttime urination). Gets up about 2x/night, no recent changes to this. Does not voice complaints about nocturia.  Patient denies neuropathy (nerve pain). Patient denies visual changes. Patient denies self foot exams.   O:   ROS  Physical Exam   Lab Results  Component Value Date   HGBA1C 8.2 (A) 10/08/2022   There were no vitals filed for this visit.  Lipid Panel     Component Value Date/Time   CHOL 162 07/04/2022 1106   TRIG 159 (H) 07/04/2022 1106   HDL 40 07/04/2022 1106   CHOLHDL 4.1 07/04/2022 1106   CHOLHDL 7.1 11/25/2013 1639   VLDL 20 11/25/2013 1639   LDLCALC 94 07/04/2022 1106    Clinical Atherosclerotic Cardiovascular Disease (ASCVD): No  The 10-year ASCVD risk score (Arnett DK, et al., 2019) is: 24.1%   Values used to calculate the score:     Age: 73 years     Sex: Male     Is Non-Hispanic African American: Yes     Diabetic: Yes     Tobacco smoker: Yes     Systolic Blood Pressure: 110 mmHg     Is BP treated: Yes     HDL Cholesterol: 40 mg/dL     Total Cholesterol: 162 mg/dL    A/P: Diabetes longstanding currently above goal based on A1c. Patient reported non-adherence with  timing of insulin injection, may have missed a few doses in the past 2 weeks. Stress from his sister's hospitalization has contributed to non-adherence to medications and BG checks. His intolerance to Trulicity was determined to be falsely noted d/t being initiated on metformin at the same time as Trulicity. He was interested in trying another GLP-1 today to help improve his A1c and potentially help lose weight. Without home BG readings in the past 2 weeks and ambiguous hypo/hyperglycemia symptoms, insulin titration may be difficult at this time.  -Continued basal  insulin Toujeo (insulin glargine) 42 units daily in the morning. Counseled on consistent timing and administration technique. -Started GLP-1 Ozempic (semaglutide) 0.25 mg once weekly.  -Continued SGLT2-I Farxiga (dapagliflozin) 10 mg daily. -Started Freestyle Libre 2 CGM, counseled to use sensors to check BG at home. If not covered, he was instructed to check BG at home via fingerstick until he is able to receive the sensors. -Patient educated on purpose, proper use, and potential adverse effects of Ozempic.  -Extensively discussed pathophysiology of diabetes, recommended lifestyle interventions, dietary effects on blood sugar control.  -Counseled on s/sx of and management of hypoglycemia.  -Next A1c anticipated 12/2022.   ASCVD risk - primary prevention in patient with diabetes. Last LDL is 94 not at goal of <70  mg/dL. ASCVD risk factors include a 10-year ASCVD risk score of 24.1%. high intensity statin indicated. Triglycerides remain elevated at 159.  -Increased dose of atorvastatin 40 to 80 mg daily.   Hypertension longstanding currently above goal of <130/80 at 146/94 mmHg. Medication adherence appropriate. Blood pressure control is suboptimal due to stress and anxiety due to sister's health status.. -Continued amlodipine 10 mg.  Written patient instructions provided. Patient verbalized understanding of treatment plan.  Total time in face to face counseling 30 minutes.    Follow-up:   Pharmacist Georgiana Shore Ausdall in 1 month  Patient seen with Rosina Lowenstein, PharmD Candidate  With Butch Penny, PharmD, CPP

## 2022-10-09 ENCOUNTER — Ambulatory Visit: Payer: BC Managed Care – PPO | Admitting: Podiatry

## 2022-10-09 ENCOUNTER — Encounter: Payer: Self-pay | Admitting: Podiatry

## 2022-10-09 ENCOUNTER — Ambulatory Visit (INDEPENDENT_AMBULATORY_CARE_PROVIDER_SITE_OTHER): Payer: BC Managed Care – PPO

## 2022-10-09 DIAGNOSIS — R609 Edema, unspecified: Secondary | ICD-10-CM | POA: Diagnosis not present

## 2022-10-09 NOTE — Progress Notes (Signed)
   Chief Complaint  Patient presents with   Post-op Follow-up    S/p sesamoidectomy- feels he has some excess skin, no fevers or chills. Big to has some swelling x 4 days, occasional pain since surgery    Subjective:  Patient presents today status post fibular sesamoidectomy of the right foot.  DOS: 08/29/2022.  Patient doing well.  WBAT in the short cam walker.  Patient has been washing his foot.  He has no significant improvement.  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  Skin incision is nicely healed.  There is some callus tissue around the area of the incision site but there is no open wound or dehiscence.  Radiographic Exam RT foot 09/04/2022:  Absence of the fibular sesamoid noted.  Impression: Interval resection of the fibular sesamoid right  Assessment: 1. s/p sesamoidectomy right. DOS: 08/29/2022   Plan of Care:  -Patient was evaluated.  - Light debridement of the callus area was performed today using a tissue nipper through 12 scalpel without incident.  The incision is nicely healed -Patient now transition out of his postoperative shoe into his good supportive diabetic shoes -Return to clinic 3  weeks  Felecia Shelling, DPM [facility]  Dr. Felecia Shelling, DPM    [address] [place], [place] [address]                Office [phone]  Fax [phone]

## 2022-10-11 ENCOUNTER — Other Ambulatory Visit (HOSPITAL_COMMUNITY): Payer: Self-pay

## 2022-10-15 ENCOUNTER — Other Ambulatory Visit: Payer: Self-pay

## 2022-10-17 ENCOUNTER — Other Ambulatory Visit (HOSPITAL_COMMUNITY): Payer: Self-pay

## 2022-10-17 ENCOUNTER — Other Ambulatory Visit: Payer: Self-pay

## 2022-10-21 ENCOUNTER — Other Ambulatory Visit (HOSPITAL_COMMUNITY): Payer: Self-pay

## 2022-10-22 ENCOUNTER — Other Ambulatory Visit: Payer: Self-pay

## 2022-10-23 ENCOUNTER — Other Ambulatory Visit: Payer: Self-pay

## 2022-10-24 ENCOUNTER — Telehealth: Payer: Self-pay | Admitting: Podiatry

## 2022-10-24 NOTE — Telephone Encounter (Signed)
Patient called and requested a refill on his pain medication.  It's oxycodoen-acetaminophen 10/325mg  Q8 hrs prn pain.  Last dispensed on 10/07/22.  Uses [facility].  His callback number is [phone]

## 2022-10-25 ENCOUNTER — Other Ambulatory Visit: Payer: Self-pay | Admitting: Podiatry

## 2022-10-25 ENCOUNTER — Other Ambulatory Visit: Payer: Self-pay

## 2022-10-25 MED ORDER — OXYCODONE-ACETAMINOPHEN 10-325 MG PO TABS
1.0000 | ORAL_TABLET | Freq: Three times a day (TID) | ORAL | 0 refills | Status: DC | PRN
  Filled 2022-10-25 – 2022-10-28 (×2): qty 21, 7d supply, fill #0

## 2022-10-25 NOTE — Telephone Encounter (Signed)
Refill sent.  Thanks, Dr. Amalia Hailey

## 2022-10-25 NOTE — Progress Notes (Signed)
PRN Postop Pain.   Felecia Shelling, DPM [facility]  Dr. Felecia Shelling, DPM    [address] [place], [place] [address]                Office [phone]  Fax [phone]

## 2022-10-26 ENCOUNTER — Other Ambulatory Visit: Payer: Self-pay

## 2022-10-28 ENCOUNTER — Other Ambulatory Visit: Payer: Self-pay

## 2022-10-28 ENCOUNTER — Other Ambulatory Visit (HOSPITAL_COMMUNITY): Payer: Self-pay

## 2022-10-29 ENCOUNTER — Other Ambulatory Visit: Payer: Self-pay

## 2022-10-29 ENCOUNTER — Encounter: Payer: Self-pay | Admitting: Oncology

## 2022-11-11 ENCOUNTER — Other Ambulatory Visit: Payer: Self-pay

## 2022-11-12 ENCOUNTER — Telehealth: Payer: Self-pay

## 2022-11-12 NOTE — Telephone Encounter (Signed)
Patient called requesting refill on oxycodone

## 2022-11-13 ENCOUNTER — Other Ambulatory Visit: Payer: Self-pay | Admitting: Podiatry

## 2022-11-13 ENCOUNTER — Other Ambulatory Visit: Payer: Self-pay

## 2022-11-13 LAB — HM DIABETES EYE EXAM

## 2022-11-13 MED ORDER — OXYCODONE-ACETAMINOPHEN 10-325 MG PO TABS
1.0000 | ORAL_TABLET | Freq: Three times a day (TID) | ORAL | 0 refills | Status: DC | PRN
  Filled 2022-11-13 (×2): qty 21, 7d supply, fill #0

## 2022-11-13 NOTE — Progress Notes (Signed)
PRN postop

## 2022-11-13 NOTE — Telephone Encounter (Signed)
Sent.  Thanks, Dr. Logan Bores

## 2022-11-13 NOTE — Progress Notes (Signed)
S:     No chief complaint on file.  57 y.o. male who presents for diabetes evaluation, education, and management.   Patient was referred and last seen by Primary Care Provider, Dr. Delford Field, on 07/04/22. Patient was last seen by [facility] on 10/08/22.  PMH is significant for HTN, T2DM, obesity, tobacco use, HLD. At last visit on 10/08/22, A1c was 8.2%. Ozempic 0.25 mg was started and atorvastatin was increased to 80 mg daily. He was counseled on insulin adherence and administration timing. We also started [facility] 2 sensors (picked up 9/4 and 9/25).  Today, Patient arrives in good spirits and presents without any assistance. Patient reports several Libre 2 sensors giving him errors, CGM data is limited since last month. Patient is tolerating Ozempic and denies n/v, diarrhea, belly pain, vision changes. Patient is complaining of a "dry cough" with some phlegm noted.   Family/Social History:  Tobacco: smokes about 1-2 cigarette per day. 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    Insurance coverage: BCBS  Patient denies hypoglycemic events. A few false sensor readings <70 d/t sensor malfunction.   Patient reports nocturia (nighttime urination).  Patient denies neuropathy (nerve pain). Patient denies visual changes. Patient reports self foot exams.   Patient-reported exercise habits: not discussed this visit   O:    Libre3  CGM Download today 9/13-9/26 Time in Goal:  - Time in range 70-180: 90% - Time above range: 8% - Time below range: 2%  Lab Results  Component Value Date   HGBA1C  8.2 (A) 10/08/2022   Vitals:   11/14/22 1118  BP: (!) 121/92  Pulse: 66    Lipid Panel     Component Value Date/Time   CHOL 162 07/04/2022 1106   TRIG 159 (H) 07/04/2022 1106   HDL 40 07/04/2022 1106   CHOLHDL 4.1 07/04/2022 1106   CHOLHDL 7.1 11/25/2013 1639   VLDL 20 11/25/2013 1639   LDLCALC 94 07/04/2022 1106    Clinical Atherosclerotic Cardiovascular Disease (ASCVD): No  The 10-year ASCVD risk score (Arnett DK, et al., 2019) is: 28.1%   Values used to calculate the score:     Age: 42 years     Sex: Male     Is Non-Hispanic African American: Yes     Diabetic: Yes     Tobacco smoker: Yes     Systolic Blood Pressure: 121 mmHg     Is BP treated: Yes     HDL Cholesterol: 40 mg/dL     Total Cholesterol: 162 mg/dL   Patient is participating in a Managed Medicaid Plan: no   A/P: Diabetes longstanding currently uncontrolled. Patient is able to verbalize appropriate hypoglycemia management plan. Medication adherence appears appropriate. Control is suboptimal due to medication regimen and CGM sensor errors. Patient reported several sensors reading errors. TIR much improved on Ozempic 0.25 mg, patient tolerating with no adverse effects. Patient reports some symptoms of thirst & urination.  -Decreased dose of basal insulin Toujeo (insulin glargine) from 44 units to 38 units daily in the morning. -Increased dose of GLP-1 Ozempic (semaglutide) from 0.25 mg to 0.5 mg .  -Continued SGLT2-I Farxiga (dapagliflozin) 10 mg.  Counseled on sick day rules. -Placed Freestyle Libre 2 Sensor on him today -Called in [place] 3 sensor to see about PA approval/cost.  -Patient educated on purpose, proper use, and potential adverse effects of Ozempic.  -Extensively discussed pathophysiology of diabetes, recommended lifestyle interventions, dietary effects on blood sugar control.  -Counseled on s/sx of and management of hypoglycemia.  -Next A1c anticipated 12/2022.   ASCVD risk - primary  prevention in patient with diabetes. Last LDL is 94 not at goal of <70 mg/dL. High intensity statin indicated.  -Continued atorvastatin 80 mg daily -Repeat lipid panel at next visit.   Hypertension longstanding currently close to goal. Blood pressure goal of <130/80  mmHg. Medication adherence appear appropriate. Blood pressure control is suboptimal due to need for additional therapy. Patient needs RAAS agent for kidney protection in DM.  -Started valsartan 40 mg.  Care Management:  -Patient complained of increased cough recently, noted to by dry but with some phelgm production. With patients' ~15 pack-year smoking hx, a referral to pulmonology may be warranted to check for lung disease.  -referral to pulmonology  Written patient instructions provided. Patient verbalized understanding of treatment plan.  Total time in face to face counseling 30 minutes.    Follow-up:  Pharmacist 1 month Patient seen with Bruce Cabrera, PharmD Candidate [facility]  Bruce Cabrera, PharmD, [place], CPP Clinical Pharmacist [facility] [phone]

## 2022-11-14 ENCOUNTER — Ambulatory Visit: Payer: BC Managed Care – PPO | Attending: Critical Care Medicine | Admitting: Pharmacist

## 2022-11-14 ENCOUNTER — Encounter: Payer: Self-pay | Admitting: Pharmacist

## 2022-11-14 ENCOUNTER — Other Ambulatory Visit: Payer: Self-pay

## 2022-11-14 VITALS — BP 121/92 | HR 66

## 2022-11-14 DIAGNOSIS — Z7984 Long term (current) use of oral hypoglycemic drugs: Secondary | ICD-10-CM | POA: Diagnosis not present

## 2022-11-14 DIAGNOSIS — E1165 Type 2 diabetes mellitus with hyperglycemia: Secondary | ICD-10-CM | POA: Diagnosis not present

## 2022-11-14 DIAGNOSIS — Z794 Long term (current) use of insulin: Secondary | ICD-10-CM

## 2022-11-14 DIAGNOSIS — Z720 Tobacco use: Secondary | ICD-10-CM

## 2022-11-14 DIAGNOSIS — I10 Essential (primary) hypertension: Secondary | ICD-10-CM

## 2022-11-14 DIAGNOSIS — Z7985 Long-term (current) use of injectable non-insulin antidiabetic drugs: Secondary | ICD-10-CM

## 2022-11-14 DIAGNOSIS — E785 Hyperlipidemia, unspecified: Secondary | ICD-10-CM

## 2022-11-14 DIAGNOSIS — E1169 Type 2 diabetes mellitus with other specified complication: Secondary | ICD-10-CM

## 2022-11-14 MED ORDER — VALSARTAN 40 MG PO TABS
40.0000 mg | ORAL_TABLET | Freq: Every day | ORAL | 3 refills | Status: DC
Start: 2022-11-14 — End: 2023-12-01
  Filled 2022-11-14 – 2022-11-19 (×2): qty 90, 90d supply, fill #0
  Filled 2023-02-23: qty 90, 90d supply, fill #1
  Filled 2023-02-24: qty 90, 90d supply, fill #0
  Filled 2023-05-26: qty 60, 60d supply, fill #1
  Filled 2023-05-26 – 2023-05-27 (×2): qty 30, 30d supply, fill #1
  Filled 2023-06-25: qty 30, 30d supply, fill #2
  Filled 2023-07-22: qty 30, 30d supply, fill #3
  Filled 2023-08-27: qty 30, 30d supply, fill #4
  Filled 2023-09-26: qty 30, 30d supply, fill #5
  Filled 2023-10-26: qty 30, 30d supply, fill #6

## 2022-11-14 MED ORDER — INSULIN GLARGINE (1 UNIT DIAL) 300 UNIT/ML ~~LOC~~ SOPN
38.0000 [IU] | PEN_INJECTOR | Freq: Every day | SUBCUTANEOUS | 4 refills | Status: DC
  Filled 2022-11-14: qty 4.5, fill #0
  Filled 2022-11-21: qty 4.5, 36d supply, fill #0
  Filled 2022-12-25: qty 4.5, 36d supply, fill #1

## 2022-11-14 MED ORDER — FREESTYLE LIBRE 3 SENSOR MISC
6 refills | Status: DC
Start: 2022-11-14 — End: 2023-04-28
  Filled 2022-11-14 – 2022-11-20 (×2): qty 2, 28d supply, fill #0
  Filled 2022-12-25: qty 2, 28d supply, fill #1
  Filled 2023-02-03: qty 2, 28d supply, fill #2
  Filled 2023-02-24 – 2023-03-06 (×3): qty 2, 28d supply, fill #3
  Filled 2023-04-08: qty 1, 14d supply, fill #4
  Filled 2023-04-18: qty 1, 14d supply, fill #5
  Filled 2023-04-21: qty 1, 14d supply, fill #0

## 2022-11-14 MED ORDER — OZEMPIC (0.25 OR 0.5 MG/DOSE) 2 MG/3ML ~~LOC~~ SOPN
0.5000 mg | PEN_INJECTOR | SUBCUTANEOUS | 1 refills | Status: DC
Start: 2022-11-14 — End: 2023-01-07
  Filled 2022-11-14: qty 3, fill #0
  Filled 2022-11-27 (×2): qty 3, 28d supply, fill #0
  Filled 2022-12-25: qty 3, 28d supply, fill #1
  Filled 2022-12-27: qty 3, 28d supply, fill #0

## 2022-11-15 ENCOUNTER — Other Ambulatory Visit: Payer: Self-pay

## 2022-11-15 NOTE — Progress Notes (Signed)
Outpatient Surgery on 08/29/2022 at [facility]  Sesamoidectomy right foot Debridement of Ulcer right foot

## 2022-11-19 ENCOUNTER — Other Ambulatory Visit: Payer: Self-pay

## 2022-11-19 ENCOUNTER — Other Ambulatory Visit (HOSPITAL_BASED_OUTPATIENT_CLINIC_OR_DEPARTMENT_OTHER): Payer: Self-pay

## 2022-11-19 ENCOUNTER — Other Ambulatory Visit (HOSPITAL_COMMUNITY): Payer: Self-pay

## 2022-11-20 ENCOUNTER — Other Ambulatory Visit (HOSPITAL_COMMUNITY): Payer: Self-pay

## 2022-11-20 ENCOUNTER — Other Ambulatory Visit: Payer: Self-pay

## 2022-11-21 ENCOUNTER — Telehealth: Payer: Self-pay | Admitting: Podiatry

## 2022-11-21 ENCOUNTER — Other Ambulatory Visit: Payer: Self-pay | Admitting: Critical Care Medicine

## 2022-11-21 ENCOUNTER — Telehealth: Payer: Self-pay

## 2022-11-21 ENCOUNTER — Other Ambulatory Visit: Payer: Self-pay | Admitting: Podiatry

## 2022-11-21 ENCOUNTER — Other Ambulatory Visit: Payer: Self-pay

## 2022-11-21 DIAGNOSIS — E1165 Type 2 diabetes mellitus with hyperglycemia: Secondary | ICD-10-CM

## 2022-11-21 MED ORDER — DAPAGLIFLOZIN PROPANEDIOL 10 MG PO TABS
10.0000 mg | ORAL_TABLET | Freq: Every day | ORAL | 4 refills | Status: DC
  Filled 2022-11-21: qty 30, 30d supply, fill #0
  Filled 2022-12-22: qty 30, 30d supply, fill #1
  Filled 2023-01-23: qty 30, 30d supply, fill #2
  Filled 2023-02-23: qty 30, 30d supply, fill #3
  Filled 2023-03-28 (×2): qty 30, 30d supply, fill #4

## 2022-11-21 MED ORDER — OXYCODONE-ACETAMINOPHEN 5-325 MG PO TABS
1.0000 | ORAL_TABLET | Freq: Four times a day (QID) | ORAL | 0 refills | Status: DC | PRN
Start: 2022-11-21 — End: 2022-12-23
  Filled 2022-11-21: qty 28, 7d supply, fill #0

## 2022-11-21 NOTE — Progress Notes (Signed)
Spoke with patient via telephone. He continues to have foot pain when working on his feet and when ambulating. Explained that we need to transition off of pain medication. He understands.   Decrease from percocet 10/325 to 5/325 #28 Q6H PRN pain. Patient will call the office for f/u appt. No additional refills until patient comes in for re-evaluation.   Felecia Shelling, DPM [facility]  Dr. Felecia Shelling, DPM    [address] [place], [place] [address]                Office [phone]  Fax [phone]

## 2022-11-21 NOTE — Telephone Encounter (Signed)
Done. Thanks.

## 2022-11-21 NOTE — Telephone Encounter (Signed)
Pharmacy Patient Advocate Encounter  Received notification from [facility] that Prior Authorization for FREESTYLE LIBRE 3 SENSORS has been APPROVED from 11/20/2022 to 11/20/2023   PA #/Case ID/Reference #: [id number]

## 2022-11-21 NOTE — Telephone Encounter (Signed)
Patient called requesting a refill on his percocet 10/325mg .  Same pharmacy as on file.  -Bruce Cabrera

## 2022-11-22 ENCOUNTER — Other Ambulatory Visit: Payer: Self-pay

## 2022-11-27 ENCOUNTER — Other Ambulatory Visit (HOSPITAL_COMMUNITY): Payer: Self-pay

## 2022-11-27 ENCOUNTER — Other Ambulatory Visit: Payer: Self-pay

## 2022-11-29 ENCOUNTER — Other Ambulatory Visit: Payer: Self-pay

## 2022-12-19 ENCOUNTER — Ambulatory Visit: Payer: BC Managed Care – PPO | Attending: Critical Care Medicine | Admitting: Pharmacist

## 2022-12-19 ENCOUNTER — Encounter: Payer: Self-pay | Admitting: Pharmacist

## 2022-12-19 VITALS — BP 133/84 | HR 82

## 2022-12-19 DIAGNOSIS — E1169 Type 2 diabetes mellitus with other specified complication: Secondary | ICD-10-CM

## 2022-12-19 DIAGNOSIS — E785 Hyperlipidemia, unspecified: Secondary | ICD-10-CM

## 2022-12-19 DIAGNOSIS — Z7985 Long-term (current) use of injectable non-insulin antidiabetic drugs: Secondary | ICD-10-CM | POA: Diagnosis not present

## 2022-12-19 DIAGNOSIS — Z7984 Long term (current) use of oral hypoglycemic drugs: Secondary | ICD-10-CM

## 2022-12-19 DIAGNOSIS — Z794 Long term (current) use of insulin: Secondary | ICD-10-CM

## 2022-12-19 DIAGNOSIS — E1165 Type 2 diabetes mellitus with hyperglycemia: Secondary | ICD-10-CM

## 2022-12-19 NOTE — Patient Instructions (Signed)
Thank you for coming to see me today. Please do the following:  Increase Ozempic to 0.5mg  once a week. Start next Wednesday (11/6). Once you start the 0.5mg  dose of Ozempic next week, you'll start taking Toujeo 34 units once daily.  Continue Farxiga.  Continue valsartan and amlodipine for BP.  Continue atorvastatin for cholesterol.

## 2022-12-19 NOTE — Progress Notes (Signed)
S:     No chief complaint on file.  57 y.o. male who presents for diabetes evaluation, education, and management.   Patient was referred and last seen by Primary Care Provider, Dr. Delford Field, on 07/04/22. Patient was last seen by [place] on 11/14/2022. At that visit, we have him increase Ozempic to 0.5 mg weekly. We also decreased Toujeo to 38u daily. Lastly, we got a PA approved for Goodman 3 as the Snover 2 sensors were not adhering to his skin properly.   PMH is significant for HTN, T2DM, obesity, tobacco use, HLD.  Today, patient arrives in good spirits and presents without any assistance. Patient reports better reporting with Libre 3. Patient is tolerating Ozempic and denies n/v, diarrhea, belly pain, vision changes. No complaints today, however, he does admit that he is still taking the 0.25 mg weekly dose.   Family/Social History:  Tobacco: smokes about 1-2 cigarette per day. 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   Insurance coverage: BCBS  Patient denies hypoglycemic events. A few false sensor readings <70 d/t sensor malfunction.   Patient reports nocturia (nighttime urination).  Patient denies neuropathy (nerve pain). Patient denies visual changes. Patient reports self foot exams.   Patient-reported exercise habits: not discussed this visit   O:  Libre3  CGM Download: 10/4 to 12/19/22 -Time active: 91% -Avg glucose: 137 mg/dL -Glucose variability: 57.8%  -Time above range: 14% -Time in target: 86% -GMI: 6.6%  Lab Results  Component Value  Date   HGBA1C 8.2 (A) 10/08/2022   Vitals:   12/19/22 0947  BP: 133/84  Pulse: 82    Lipid Panel     Component Value Date/Time   CHOL 162 07/04/2022 1106   TRIG 159 (H) 07/04/2022 1106   HDL 40 07/04/2022 1106   CHOLHDL 4.1 07/04/2022 1106   CHOLHDL 7.1 11/25/2013 1639   VLDL 20 11/25/2013 1639   LDLCALC 94 07/04/2022 1106    Clinical Atherosclerotic Cardiovascular Disease (ASCVD): No  The 10-year ASCVD risk score (Arnett DK, et al., 2019) is: 32.7%   Values used to calculate the score:     Age: 62 years     Sex: Male     Is Non-Hispanic African American: Yes     Diabetic: Yes     Tobacco smoker: Yes     Systolic Blood Pressure: 133 mmHg     Is BP treated: Yes     HDL Cholesterol: 40 mg/dL     Total Cholesterol: 162 mg/dL   Patient is participating in a Managed Medicaid Plan: no   A/P: Diabetes longstanding currently uncontrolled based on A1c, however, CGM report shows good control at home. Commended him for this! Patient is able to verbalize appropriate hypoglycemia management plan but has no hypoglycemia at this time. Medication adherence appears appropriate, however, he continues the 0.25 mg dose of Ozempic. -Decreased dose of basal insulin Toujeo (insulin glargine) from 38 units to 34 units daily in the morning. -Increased dose of GLP-1 Ozempic (semaglutide) from 0.25 mg to 0.5 mg.  -Continued SGLT2-I Farxiga (dapagliflozin) 10 mg. Counseled on sick day rules. -Patient educated  on purpose, proper use, and potential adverse effects of Ozempic.  -Extensively discussed pathophysiology of diabetes, recommended lifestyle interventions, dietary effects on blood sugar control.  -Counseled on s/sx of and management of hypoglycemia.  -Next A1c anticipated 12/2022.   ASCVD risk - primary prevention in patient with diabetes. Last LDL is 94 not at goal of <70 mg/dL (May of this year). High intensity statin indicated.  -Continued atorvastatin 80 mg daily -Lipid panel  today.  Hypertension longstanding currently close to goal. Blood pressure goal of <130/80  mmHg. Medication adherence appear appropriate. -Continue valsartan 40 mg. -Continue amlodipine 10 mg daily.  Written patient instructions provided. Patient verbalized understanding of treatment plan.  Total time in face to face counseling 30 minutes.    Follow-up:  Pharmacist in December.  PCP: next month.  Butch Penny, PharmD, Patsy Baltimore, CPP Clinical Pharmacist [facility] [phone]

## 2022-12-20 LAB — CMP14+EGFR
ALT: 35 [IU]/L (ref 0–44)
AST: 27 [IU]/L (ref 0–40)
Albumin: 4 g/dL (ref 3.8–4.9)
Alkaline Phosphatase: 54 [IU]/L (ref 44–121)
BUN/Creatinine Ratio: 14 (ref 9–20)
BUN: 19 mg/dL (ref 6–24)
Bilirubin Total: 0.3 mg/dL (ref 0.0–1.2)
CO2: 21 mmol/L (ref 20–29)
Calcium: 9.5 mg/dL (ref 8.7–10.2)
Chloride: 103 mmol/L (ref 96–106)
Creatinine, Ser: 1.33 mg/dL — ABNORMAL HIGH (ref 0.76–1.27)
Globulin, Total: 2.9 g/dL (ref 1.5–4.5)
Glucose: 80 mg/dL (ref 70–99)
Potassium: 4.5 mmol/L (ref 3.5–5.2)
Sodium: 142 mmol/L (ref 134–144)
Total Protein: 6.9 g/dL (ref 6.0–8.5)
eGFR: 64 mL/min/{1.73_m2} (ref >59)

## 2022-12-20 LAB — LIPID PANEL
Chol/HDL Ratio: 3.8 ratio (ref 0.0–5.0)
Cholesterol, Total: 141 mg/dL (ref 100–199)
HDL: 37 mg/dL — ABNORMAL LOW (ref >39)
LDL Chol Calc (NIH): 86 mg/dL (ref 0–99)
Triglycerides: 98 mg/dL (ref 0–149)
VLDL Cholesterol Cal: 18 mg/dL (ref 5–40)

## 2022-12-22 ENCOUNTER — Other Ambulatory Visit: Payer: Self-pay | Admitting: Critical Care Medicine

## 2022-12-23 ENCOUNTER — Telehealth: Payer: Self-pay

## 2022-12-23 ENCOUNTER — Ambulatory Visit (INDEPENDENT_AMBULATORY_CARE_PROVIDER_SITE_OTHER): Payer: BC Managed Care – PPO | Admitting: Podiatry

## 2022-12-23 ENCOUNTER — Encounter: Payer: Self-pay | Admitting: Podiatry

## 2022-12-23 ENCOUNTER — Other Ambulatory Visit: Payer: Self-pay

## 2022-12-23 DIAGNOSIS — B351 Tinea unguium: Secondary | ICD-10-CM | POA: Diagnosis not present

## 2022-12-23 DIAGNOSIS — M79674 Pain in right toe(s): Secondary | ICD-10-CM

## 2022-12-23 DIAGNOSIS — M25871 Other specified joint disorders, right ankle and foot: Secondary | ICD-10-CM | POA: Diagnosis not present

## 2022-12-23 DIAGNOSIS — M79675 Pain in left toe(s): Secondary | ICD-10-CM

## 2022-12-23 MED ORDER — BUPROPION HCL ER (XL) 150 MG PO TB24
150.0000 mg | ORAL_TABLET | Freq: Every day | ORAL | 2 refills | Status: DC
  Filled 2022-12-23: qty 60, 60d supply, fill #0
  Filled 2023-02-23: qty 60, 60d supply, fill #1
  Filled 2023-04-20: qty 60, 60d supply, fill #2

## 2022-12-23 MED ORDER — OXYCODONE-ACETAMINOPHEN 10-325 MG PO TABS
1.0000 | ORAL_TABLET | Freq: Three times a day (TID) | ORAL | 0 refills | Status: DC | PRN
Start: 2022-12-23 — End: 2023-01-08
  Filled 2022-12-23: qty 30, 10d supply, fill #0

## 2022-12-23 NOTE — Telephone Encounter (Signed)
-----   Message from Claiborne Rigg sent at 12/22/2022  9:45 PM EST ----- Kidney function stable. Normal liver enzymes and electrolytes.

## 2022-12-23 NOTE — Telephone Encounter (Signed)
-----   Message from Roney Jaffe sent at 12/23/2022  1:56 PM EST ----- Please call patient and let him know that his cholesterol is well-controlled, continue current regimen of atorvastatin.

## 2022-12-23 NOTE — Telephone Encounter (Signed)
Pt was called and is aware of results, DOB was confirmed.  ?

## 2022-12-23 NOTE — Progress Notes (Signed)
   Chief Complaint  Patient presents with   Routine Post Op    PATIENT STATES THAT HE IS HERE FOR A FOLLOW UP FOR HIS RF AND HE IS HAVING A LOT OF PAIN IN BILATERAL FEET AND IT IS HARD FOR HIM TO WALK AT WORK FOR LONG PERIODS OF TIME .  PATIENT IS TAKING MEDICATION FOR PAIN BUT STATES THAT IT DOES NOT HELP . PATIENT STATES HE NEEDS HIS NAILS CUT DOWN ALSO .     Subjective:  Patient presents today status post fibular sesamoidectomy of the right foot.  DOS: 08/29/2022.  Patient continues to have pain and tenderness associated to the plantar aspect of the right foot.  He says that although he is wearing his diabetic insoles and shoes he continues to have pain.  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  Skin incision is nicely healed.  There is significant callus tissue around the area of the incision site but there is no open wound or dehiscence. Hyperkeratotic dystrophic nails also noted 1-5 bilateral.  Radiographic Exam RT foot 09/04/2022:  Absence of the fibular sesamoid noted.  Impression: Interval resection of the fibular sesamoid right  Assessment: 1. s/p sesamoidectomy right. DOS:  08/29/2022 2.  Pain due to onychomycosis of toenails both   Plan of Care:  -Patient was evaluated.  -Mechanical debridement of nails 1-5 bilateral was performed using a nail nipper without incident or bleeding. - Excisional debridement of the callus area was performed today using a tissue nipper through 12 scalpel without incident.  The incision continues to be nicely healed - Continue diabetic shoes with custom molded Plastizote insoles -The patient continues to have significant pain and tenderness associated to the plantar aspect of the right first MTP.  I do believe an MRI would help with the edema identify the underlying pathology.  Order placed MRI RT toes wo contrast -Prescription for Percocet 10/325 mg every 8 hours as needed -Return to clinic after MRI to review results and discuss further treatment options  *Works at the [employer]  Felecia Shelling, DPM [facility]  Dr. Felecia Shelling, DPM    [address] [place], [place] [address]                Office [phone]  Fax [phone]

## 2022-12-24 ENCOUNTER — Other Ambulatory Visit: Payer: Self-pay

## 2022-12-25 ENCOUNTER — Other Ambulatory Visit: Payer: Self-pay

## 2022-12-27 ENCOUNTER — Other Ambulatory Visit: Payer: Self-pay

## 2023-01-06 ENCOUNTER — Other Ambulatory Visit: Payer: Self-pay

## 2023-01-06 ENCOUNTER — Encounter: Payer: Self-pay | Admitting: Podiatry

## 2023-01-07 ENCOUNTER — Encounter: Payer: Self-pay | Admitting: Oncology

## 2023-01-07 ENCOUNTER — Other Ambulatory Visit: Payer: Self-pay

## 2023-01-07 ENCOUNTER — Ambulatory Visit: Payer: BC Managed Care – PPO | Admitting: Critical Care Medicine

## 2023-01-07 ENCOUNTER — Ambulatory Visit: Payer: BC Managed Care – PPO | Attending: Critical Care Medicine | Admitting: Nurse Practitioner

## 2023-01-07 ENCOUNTER — Encounter: Payer: Self-pay | Admitting: Nurse Practitioner

## 2023-01-07 VITALS — BP 122/80 | HR 84 | Temp 98.0°F | Ht 75.0 in | Wt 286.2 lb

## 2023-01-07 DIAGNOSIS — Z1211 Encounter for screening for malignant neoplasm of colon: Secondary | ICD-10-CM | POA: Diagnosis not present

## 2023-01-07 DIAGNOSIS — I10 Essential (primary) hypertension: Secondary | ICD-10-CM | POA: Diagnosis not present

## 2023-01-07 DIAGNOSIS — Z7984 Long term (current) use of oral hypoglycemic drugs: Secondary | ICD-10-CM | POA: Diagnosis not present

## 2023-01-07 DIAGNOSIS — D649 Anemia, unspecified: Secondary | ICD-10-CM

## 2023-01-07 DIAGNOSIS — Z139 Encounter for screening, unspecified: Secondary | ICD-10-CM

## 2023-01-07 DIAGNOSIS — R350 Frequency of micturition: Secondary | ICD-10-CM

## 2023-01-07 DIAGNOSIS — E119 Type 2 diabetes mellitus without complications: Secondary | ICD-10-CM

## 2023-01-07 DIAGNOSIS — Z794 Long term (current) use of insulin: Secondary | ICD-10-CM

## 2023-01-07 LAB — POCT GLYCOSYLATED HEMOGLOBIN (HGB A1C): Hemoglobin A1C: 7.6 % — AB (ref 4.0–5.6)

## 2023-01-07 MED ORDER — SEMAGLUTIDE (1 MG/DOSE) 4 MG/3ML ~~LOC~~ SOPN
1.0000 mg | PEN_INJECTOR | SUBCUTANEOUS | 3 refills | Status: DC
  Filled 2023-01-07: qty 3, 28d supply, fill #0
  Filled 2023-02-06: qty 3, 28d supply, fill #1
  Filled 2023-03-06 – 2023-03-24 (×3): qty 3, 28d supply, fill #2
  Filled 2023-04-14 – 2023-04-16 (×2): qty 3, 28d supply, fill #3

## 2023-01-07 NOTE — Progress Notes (Signed)
Assessment & Plan:  Bruce Cabrera was seen today for medical management of chronic issues.  Diagnoses and all orders for this visit:  Diabetes mellitus treated with insulin and oral medication  Dose change increase Ozempic from 0.5 mg weekly to 1 mg weekly and continue Farxiga. -     POCT glycosylated hemoglobin (Hb A1C) -     Urine Albumin/Creatinine with ratio (send out) [LAB689] -     Semaglutide, 1 MG/DOSE, 4 MG/3ML SOPN; Inject 1 mg as directed once a week.  Primary hypertension Continue all antihypertensives as prescribed.  Reminded to bring in blood pressure log for follow  up appointment.  RECOMMENDATIONS: DASH/Mediterranean Diets are healthier choices for HTN.    Colon cancer screening -     Ambulatory referral to Gastroenterology  Anemia, unspecified type -     CBC with Differential  Urine frequency -     PSA  Encounter for screening involving social determinants of health (SDoH) -     AMB Referral [facility] Care Management    Patient has been counseled on age-appropriate routine health concerns for screening and prevention. These are reviewed and up-to-date. Referrals have been placed accordingly. Immunizations are up-to-date or declined.    Subjective:   Chief Complaint  Patient presents with   Medical Management of Chronic Issues    Bruce Cabrera 57 y.o. male presents to office today for follow-up to diabetes.  He is a patient of Dr. Delford Field.  He has a past medical history of diabetes, diabetes foot ulcer, hyperlipidemia, hypertension and CKD stage V.  Per his report last eye exam this past September was normal with no retinopathy noted.  I will follow-up with his eye doctor to have records faxed.  Patient has been counseled on age-appropriate routine health concerns for screening and prevention. These are reviewed and up-to-date. Referrals have been placed accordingly. Immunizations are up-to-date or declined.     Colon cancer screening: Overdue.  Referral placed PSA:  Overdue: Ordered today.  HTN  Blood pressure is well-controlled today.  He is taking amlodipine 10 mg daily and valsartan 40 mg daily. BP Readings from Last 3 Encounters:  01/07/23 122/80  12/19/22 133/84  11/14/22 (!) 121/92    DM 2 Diabetes improved and A1c near goal of less than 7.  Currently prescribed Ozempic 0.5 mg weekly and Farxiga 10 mg daily. Lab Results  Component Value Date   HGBA1C 7.6 (A) 01/07/2023    Lab Results  Component Value Date   HGBA1C 8.2 (A) 10/08/2022  LDL not at goal although  he is prescribed high intensity statin. Lab Results  Component Value Date   LDLCALC 86 12/19/2022    Review of Systems  Constitutional:  Negative for fever, malaise/fatigue and weight loss.  HENT: Negative.  Negative for nosebleeds.   Eyes: Negative.  Negative for blurred vision, double vision and photophobia.  Respiratory: Negative.  Negative for cough and shortness of breath.   Cardiovascular: Negative.  Negative for chest pain, palpitations and leg swelling.  Gastrointestinal: Negative.  Negative for heartburn, nausea and vomiting.  Genitourinary:  Positive for frequency. Negative for dysuria, flank pain, hematuria and urgency.  Musculoskeletal: Negative.  Negative for myalgias.  Neurological: Negative.  Negative for dizziness, focal weakness, seizures and headaches.  Psychiatric/Behavioral: Negative.  Negative for suicidal ideas.     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   Outpatient Medications Prior to Visit  Medication Sig Dispense Refill   Accu-Chek Softclix Lancets lancets Use as instructed 100 each 12   amLODipine (NORVASC) 10 MG tablet Take 1 tablet (10 mg total) by mouth daily. 90 tablet 2   atorvastatin (LIPITOR) 80 MG tablet Take 1 tablet (80 mg total) by mouth daily. 90 tablet 3   Blood Glucose Monitoring Suppl (ACCU-CHEK GUIDE) w/Device KIT Use to check blood sugar twice daily 1 kit 0   buPROPion (WELLBUTRIN XL) 150 MG 24 hr tablet Take 1 tablet (150 mg total) by mouth daily. 60 tablet 2   Continuous Glucose Receiver (FREESTYLE LIBRE 2 READER) DEVI Use to check blood sugar continuously throughout the day. Change sensors once every 14 days. 1 each 0   Continuous Glucose Sensor (FREESTYLE LIBRE 2 SENSOR) MISC Use to check blood sugar continuously throughout the day. Change sensors once every 14 days. 2 each 6   Continuous Glucose Sensor (FREESTYLE LIBRE 3 SENSOR) MISC Place 1 sensor on the skin every 14 days. Use to check glucose continuously 2 each 6   dapagliflozin propanediol (FARXIGA) 10 MG TABS tablet Take 1 tablet (10 mg total) by mouth daily before breakfast. 30 tablet 4   glucose blood (ACCU-CHEK GUIDE) test strip Use as instructed 100 each 12   insulin glargine, 1 Unit Dial, (TOUJEO) 300 UNIT/ML Solostar Pen Inject 38 Units into the skin at bedtime. 4.5 mL 4   Insulin Pen Needle (TRUEPLUS 5-BEVEL PEN NEEDLES) 32G X 4 MM MISC Use to inject Toujeo once daily. 100 each 2   Lancets Misc. (ACCU-CHEK SOFTCLIX LANCET DEV) KIT use as directed 1 kit 0   oxyCODONE-acetaminophen (PERCOCET) 10-325 MG tablet Take 1 tablet by mouth every 8 (eight)  hours as needed for pain. 30 tablet 0   valsartan (DIOVAN) 40 MG tablet Take 1 tablet (40 mg total) by mouth daily. 90 tablet 3   doxycycline (VIBRA-TABS) 100 MG tablet Take 1 tablet (100 mg total) by mouth 2 (two) times daily. 28 tablet 0   Semaglutide,0.25 or 0.5MG /DOS, (OZEMPIC, 0.25 OR 0.5 MG/DOSE,) 2 MG/3ML SOPN Inject 0.5 mg into the skin once a week. 3 mL 1   No facility-administered medications prior to visit.    Allergies  Allergen Reactions   Trulicity [Dulaglutide] Other (See Comments)    Abdominal pain       Objective:    BP 122/80 (BP Location: Left Arm, Patient Position: Sitting, Cuff Size: Normal)   Pulse 84   Temp 98 F (36.7 C) (Oral)   Ht 6\' 3"  (1.905 m)   Wt 286 lb 3.2 oz (129.8 kg)   SpO2 98%   BMI 35.77 kg/m  Wt Readings from Last 3 Encounters:  01/07/23 286 lb 3.2 oz (129.8 kg)  07/04/22 283 lb (128.4 kg)  05/09/22 282 lb 3.2 oz (128 kg)    Physical Exam Vitals and nursing note reviewed.  Constitutional:      Appearance: He is well-developed.  HENT:     Head: Normocephalic and atraumatic.  Cardiovascular:     Rate and Rhythm: Normal rate and regular rhythm.     Heart sounds: Normal heart sounds. No murmur heard.    No friction rub. No gallop.  Pulmonary:     Effort: Pulmonary effort is normal. No tachypnea or respiratory distress.     Breath sounds: Normal breath sounds. No decreased breath sounds, wheezing, rhonchi or rales.  Chest:     Chest wall: No tenderness.  Abdominal:     General: Bowel sounds are normal.     Palpations: Abdomen is soft.  Musculoskeletal:        General: Normal range of motion.     Cervical back: Normal range of motion.  Skin:    General: Skin is warm and dry.  Neurological:     Mental Status: He is alert and oriented to person, place, and time.     Coordination: Coordination normal.  Psychiatric:        Behavior: Behavior normal. Behavior is cooperative.        Thought Content: Thought content normal.         Judgment: Judgment normal.          Patient has been counseled extensively about nutrition and exercise as well as the importance of adherence with medications and regular follow-up. The patient was given clear instructions to go to ER or return to medical center if symptoms don't improve, worsen or new problems develop. The patient verbalized understanding.   Follow-up: Return in about 3 months (around 04/09/2023) for a1c.   Claiborne Rigg, FNP-BC [facility] and [facility] [place], [place] [phone]   01/07/2023, 1:27 PM

## 2023-01-08 ENCOUNTER — Other Ambulatory Visit: Payer: Self-pay | Admitting: Podiatry

## 2023-01-08 ENCOUNTER — Telehealth: Payer: Self-pay | Admitting: Podiatry

## 2023-01-08 ENCOUNTER — Other Ambulatory Visit: Payer: Self-pay

## 2023-01-08 ENCOUNTER — Telehealth: Payer: Self-pay | Admitting: *Deleted

## 2023-01-08 MED ORDER — OXYCODONE-ACETAMINOPHEN 10-325 MG PO TABS
1.0000 | ORAL_TABLET | Freq: Three times a day (TID) | ORAL | 0 refills | Status: DC | PRN
  Filled 2023-01-08: qty 30, 10d supply, fill #0

## 2023-01-08 NOTE — Telephone Encounter (Signed)
Patient called asking for a refill on medication, should be for the oxy

## 2023-01-08 NOTE — Progress Notes (Unsigned)
  Care Coordination  Outreach Note  01/08/2023 Name: Bruce Cabrera MRN: [id number] DOB: [birth year]   Care Coordination Outreach Attempts: An unsuccessful telephone outreach was attempted today to offer the patient information about available care coordination services.  Follow Up Plan:  Additional outreach attempts will be made to offer the patient care coordination information and services.   Encounter Outcome:  No Answer  Gwenevere Ghazi  Care Coordination Care Guide  Direct Dial: [phone]

## 2023-01-09 ENCOUNTER — Other Ambulatory Visit: Payer: Self-pay

## 2023-01-09 NOTE — Progress Notes (Signed)
  Care Coordination   Note   01/09/2023 Name: Bruce Cabrera MRN: [id number] DOB: 1965-04-13  Bruce Cabrera is a 57 y.o. year old male who sees Claiborne Rigg, NP for primary care. I reached out to [name] by phone today to offer care coordination services.  [name] was given information about Care Coordination services today including:   The Care Coordination services include support from the care team which includes your Nurse Coordinator, Clinical Social Worker, or Pharmacist.  The Care Coordination team is here to help remove barriers to the health concerns and goals most important to you. Care Coordination services are voluntary, and the patient may decline or stop services at any time by request to their care team member.   Care Coordination Consent Status: Patient agreed to services and verbal consent obtained.   Follow up plan:  Telephone appointment with care coordination team member scheduled for:  01/21/23  Encounter Outcome:  Patient Scheduled  [facility] Coordination Care Guide  Direct Dial: [phone]

## 2023-01-10 ENCOUNTER — Ambulatory Visit: Payer: Self-pay | Admitting: *Deleted

## 2023-01-10 NOTE — Telephone Encounter (Signed)
Reason for Disposition  [1] MILD-MODERATE pain AND [2] constant AND [3] present > 2 hours    Declines UC- wants appointment in office  Answer Assessment - Initial Assessment Questions 1. LOCATION: "Where does it hurt?"      Center- upper pain- 2. RADIATION: "Does the pain shoot anywhere else?" (e.g., chest, back)     Same area- does not radiate 3. ONSET: "When did the pain begin?" (Minutes, hours or days ago)      Started 3-4 day ago- does not change  5. PATTERN "Does the pain come and go, or is it constant?"    - If it comes and goes: "How long does it last?" "Do you have pain now?"     (Note: Comes and goes means the pain is intermittent. It goes away completely between bouts.)    - If constant: "Is it getting better, staying the same, or getting worse?"      (Note: Constant means the pain never goes away completely; most serious pain is constant and gets worse.)      Constant 6. SEVERITY: "How bad is the pain?"  (e.g., Scale 1-10; mild, moderate, or severe)    - MILD (1-3): Doesn't interfere with normal activities, abdomen soft and not tender to touch.     - MODERATE (4-7): Interferes with normal activities or awakens from sleep, abdomen tender to touch.     - SEVERE (8-10): Excruciating pain, doubled over, unable to do any normal activities.       7-8/10 7. RECURRENT SYMPTOM: "Have you ever had this type of stomach pain before?" If Yes, ask: "When was the last time?" and "What happened that time?"      no 8. CAUSE: "What do you think is causing the stomach pain?"     unsure 9. RELIEVING/AGGRAVATING FACTORS: "What makes it better or worse?" (e.g., antacids, bending or twisting motion, bowel movement)     Feels like needs to have BM-but having BM does not help  10. OTHER SYMPTOMS: "Do you have any other symptoms?" (e.g., back pain, diarrhea, fever, urination pain, vomiting)       No other symptoms- does not think food related  Protocols used: Abdominal Pain - Male-A-AH

## 2023-01-10 NOTE — Telephone Encounter (Signed)
  Chief Complaint: upper midline pain- moderate- constant Symptoms: patient states he has not tried to treat- requesting appointment Frequency: 3-4 days Pertinent Negatives: Patient denies back pain, diarrhea, fever, urination pain, vomiting  Disposition: [] ED /[] Urgent Care (no appt availability in office) / [] Appointment(In office/virtual)/ []  [facility]/ [] Home Care/ [x] Refused Recommended Disposition /[] [facility]/ []  Follow-up with PCP Additional Notes: Patient declines UC- wants office appointment- agrees if he gets worse to be seen. Appointment scheduled

## 2023-01-11 ENCOUNTER — Ambulatory Visit
Admission: RE | Admit: 2023-01-11 | Discharge: 2023-01-11 | Disposition: A | Discharge status: Home / Self Care | Payer: BC Managed Care – PPO | Source: Ambulatory Visit | Attending: Podiatry | Admitting: Podiatry

## 2023-01-11 DIAGNOSIS — M25871 Other specified joint disorders, right ankle and foot: Secondary | ICD-10-CM

## 2023-01-11 DIAGNOSIS — M65871 Other synovitis and tenosynovitis, right ankle and foot: Secondary | ICD-10-CM | POA: Diagnosis not present

## 2023-01-11 DIAGNOSIS — M79674 Pain in right toe(s): Secondary | ICD-10-CM | POA: Diagnosis not present

## 2023-01-13 ENCOUNTER — Ambulatory Visit: Payer: BC Managed Care – PPO | Attending: Physician Assistant | Admitting: Physician Assistant

## 2023-01-13 ENCOUNTER — Encounter: Payer: Self-pay | Admitting: Physician Assistant

## 2023-01-13 VITALS — BP 134/77 | HR 106 | Wt 284.4 lb

## 2023-01-13 DIAGNOSIS — K56600 Partial intestinal obstruction, unspecified as to cause: Secondary | ICD-10-CM | POA: Diagnosis not present

## 2023-01-13 DIAGNOSIS — Z794 Long term (current) use of insulin: Secondary | ICD-10-CM | POA: Diagnosis not present

## 2023-01-13 DIAGNOSIS — K5909 Other constipation: Secondary | ICD-10-CM

## 2023-01-13 DIAGNOSIS — R1084 Generalized abdominal pain: Secondary | ICD-10-CM

## 2023-01-13 DIAGNOSIS — Z7984 Long term (current) use of oral hypoglycemic drugs: Secondary | ICD-10-CM

## 2023-01-13 DIAGNOSIS — E1165 Type 2 diabetes mellitus with hyperglycemia: Secondary | ICD-10-CM

## 2023-01-13 LAB — POCT URINALYSIS DIP (CLINITEK)
Bilirubin, UA: NEGATIVE
Glucose, UA: 500 mg/dL — AB
Ketones, POC UA: NEGATIVE mg/dL
Leukocytes, UA: NEGATIVE
Nitrite, UA: NEGATIVE
POC PROTEIN,UA: >=300 — AB
Spec Grav, UA: 1.015 (ref 1.010–1.025)
Urobilinogen, UA: 0.2 U/dL
pH, UA: 5.5 (ref 5.0–8.0)

## 2023-01-13 NOTE — Patient Instructions (Addendum)
If you worsen AT ALL please go to the emergency department.  This includes fever, increase in pain or vomiting.    Drink 64 to 80 ounces water daily.

## 2023-01-13 NOTE — Progress Notes (Signed)
Bruce Cabrera, is a 57 y.o. male  PIR:[id number]  YTK:[id number]  DOB - 10-21-1965  Chief Complaint  Patient presents with   Abdominal Pain       Subjective:   Bruce Cabrera is a 57 y.o. male here today for 1 week h/o abdominal discomfort and distention.  His appetite is ok-he eats a lot of junk food and admits to poor water intake.  He has not had a "regular" bowel movement in 1 week to 10 days.  He did not start taking a higher dose of Ozempic yet bc he still had pens at the lower dose(still on 0.5mg  and not due until Wednesday for next injection.  He has only passed a few pebbles of stool over the last week.  He has tried mineral oil, milk of magnesia and pepto bismol.  He says he has a weird taste in his mouth.  No vomiting.  No fever.  No melena or hematochezia. No diarrhea.  Pain is not localized.  No dysuria.    No problems updated.  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 Authorizing Provider  Accu-Chek Softclix Lancets lancets Use as instructed 08/23/20  Yes Storm Frisk, MD  amLODipine (NORVASC) 10 MG tablet Take 1 tablet (10 mg total) by mouth daily. 06/07/22  Yes Storm Frisk, MD  atorvastatin (LIPITOR) 80 MG tablet Take 1 tablet (80 mg total) by mouth daily. 10/08/22  Yes Storm Frisk, MD  Blood Glucose Monitoring Suppl (ACCU-CHEK GUIDE) w/Device KIT Use to check blood sugar twice daily 08/23/20  Yes Storm Frisk, MD   buPROPion (WELLBUTRIN XL) 150 MG 24 hr tablet Take 1 tablet (150 mg total) by mouth daily. 12/23/22  Yes Storm Frisk, MD  Continuous Glucose Receiver (FREESTYLE LIBRE 2 READER) DEVI Use to check blood sugar continuously throughout the day. Change sensors once every 14 days. 10/08/22  Yes Storm Frisk, MD  Continuous Glucose Sensor (FREESTYLE LIBRE 2 SENSOR) MISC Use to check blood sugar continuously throughout the day. Change sensors once every 14 days. 10/08/22  Yes Storm Frisk, MD  Continuous Glucose Sensor (FREESTYLE LIBRE 3 SENSOR) MISC Place 1 sensor on the skin every 14 days. Use to check glucose continuously 11/14/22  Yes Storm Frisk, MD  dapagliflozin propanediol (FARXIGA) 10 MG TABS tablet Take 1 tablet (10 mg total) by mouth daily before breakfast. 11/21/22  Yes Storm Frisk, MD  glucose blood (ACCU-CHEK GUIDE) test strip Use as instructed 07/23/21  Yes Storm Frisk, MD  insulin glargine, 1 Unit Dial, (TOUJEO) 300 UNIT/ML Solostar Pen Inject 38 Units into the skin at bedtime. 11/14/22 02/01/23 Yes Storm Frisk, MD  Insulin Pen Needle (TRUEPLUS 5-BEVEL PEN NEEDLES) 32G X 4 MM MISC Use to inject Toujeo once daily. 07/23/21  Yes Storm Frisk, MD  Lancets Misc. (ACCU-CHEK SOFTCLIX LANCET DEV) KIT use as directed 10/29/21  Yes Storm Frisk, MD  oxyCODONE-acetaminophen (PERCOCET) 10-325 MG tablet Take 1 tablet by mouth every 8 (eight) hours as needed for pain. 01/08/23  Yes Felecia Shelling, DPM  Semaglutide, 1 MG/DOSE, 4 MG/3ML SOPN Inject 1 mg as directed once a week. 01/07/23  Yes Claiborne Rigg, NP  valsartan (DIOVAN) 40 MG tablet Take 1 tablet (40 mg total) by mouth daily. 11/14/22  Yes Storm Frisk, MD    ROS: Neg HEENT Neg resp Neg cardiac Neg GU Neg MS Neg psych Neg neuro  Objective:   Vitals:   01/13/23 0915  BP: 134/77  Pulse: (!) 106  SpO2: 97%  Weight: 284 lb 6.4 oz (129 kg)   Exam General appearance : Awake, alert, not in  any distress. Speech Clear. Not toxic looking HEENT: Atraumatic and Normocephalic, pupils equally reactive to light and accomodation Neck: Supple, no JVD. No cervical lymphadenopathy.  Chest: Good air entry bilaterally, CTAB.  No rales/rhonchi/wheezing CVS: S1 S2 regular, no murmurs.  Abdomen: Bowel sounds decreased  with no localized gaurding, rigidity or rebound. There is mild distention and firmness but not rigid.   Extremities: B/L Lower Ext shows no edema, both legs are warm to touch Neurology: Awake alert, and oriented X 3, CN II-XII intact, Non focal Skin: No Rash  Data Review Lab Results  Component Value Date   HGBA1C 7.6 (A) 01/07/2023   HGBA1C 8.2 (A) 10/08/2022   HGBA1C 8.4 (H) 07/04/2022    Assessment & Plan   1. Generalized abdominal pain Not acute abdomen but concerned for partial obstruction.  To ED if worsens at all!  Patient verbalized understanding - CT ABDOMEN PELVIS W WO CONTRAST; Future-1st available was tomorrow morning.    2. Partial intestinal obstruction, unspecified cause (HCC) - CT ABDOMEN PELVIS W WO CONTRAST; Future - CMP14+EGFR - CBC with Differential - TSH  3. Type 2 diabetes mellitus with hyperglycemia, unspecified whether long term insulin use (HCC) Continue current regimen - POCT URINALYSIS DIP (CLINITEK)  4. Long term (current) use of insulin (HCC)  5. Long term current use of oral hypoglycemic drug   6. Other constipation - CT ABDOMEN PELVIS W WO CONTRAST; Future - CMP14+EGFR - CBC with Differential - TSH    Return for next scheuled appt with PCP.  The patient was given clear instructions to go to ER or return to medical center if symptoms don't improve, worsen or new problems develop. The patient verbalized understanding. The patient was told to call to get lab results if they haven't heard anything in the next week.      Georgian Co, PA-C [facility] [place], [place] [phone]    01/13/2023, 9:38 AM

## 2023-01-13 NOTE — Progress Notes (Signed)
BS 144

## 2023-01-14 ENCOUNTER — Other Ambulatory Visit: Payer: Self-pay

## 2023-01-14 ENCOUNTER — Ambulatory Visit
Admission: RE | Admit: 2023-01-14 | Discharge: 2023-01-14 | Disposition: A | Discharge status: Home / Self Care | Payer: BC Managed Care – PPO | Source: Ambulatory Visit | Attending: Physician Assistant | Admitting: Physician Assistant

## 2023-01-14 ENCOUNTER — Other Ambulatory Visit: Payer: Self-pay | Admitting: Physician Assistant

## 2023-01-14 ENCOUNTER — Telehealth: Payer: Self-pay

## 2023-01-14 DIAGNOSIS — R109 Unspecified abdominal pain: Secondary | ICD-10-CM | POA: Diagnosis not present

## 2023-01-14 DIAGNOSIS — K629 Disease of anus and rectum, unspecified: Secondary | ICD-10-CM | POA: Diagnosis not present

## 2023-01-14 DIAGNOSIS — I700 Atherosclerosis of aorta: Secondary | ICD-10-CM | POA: Diagnosis not present

## 2023-01-14 DIAGNOSIS — K529 Noninfective gastroenteritis and colitis, unspecified: Secondary | ICD-10-CM

## 2023-01-14 DIAGNOSIS — R599 Enlarged lymph nodes, unspecified: Secondary | ICD-10-CM

## 2023-01-14 DIAGNOSIS — R1084 Generalized abdominal pain: Secondary | ICD-10-CM

## 2023-01-14 DIAGNOSIS — K56600 Partial intestinal obstruction, unspecified as to cause: Secondary | ICD-10-CM

## 2023-01-14 DIAGNOSIS — K5909 Other constipation: Secondary | ICD-10-CM

## 2023-01-14 LAB — CBC WITH DIFFERENTIAL/PLATELET
Basophils Absolute: 0 10*3/uL (ref 0.0–0.2)
Basos: 0 %
EOS (ABSOLUTE): 0.1 10*3/uL (ref 0.0–0.4)
Eos: 2 %
Hematocrit: 41.9 % (ref 37.5–51.0)
Hemoglobin: 13.4 g/dL (ref 13.0–17.7)
Immature Grans (Abs): 0 10*3/uL (ref 0.0–0.1)
Immature Granulocytes: 0 %
Lymphocytes Absolute: 2.1 10*3/uL (ref 0.7–3.1)
Lymphs: 30 %
MCH: 24.4 pg — ABNORMAL LOW (ref 26.6–33.0)
MCHC: 32 g/dL (ref 31.5–35.7)
MCV: 76 fL — ABNORMAL LOW (ref 79–97)
Monocytes Absolute: 0.5 10*3/uL (ref 0.1–0.9)
Monocytes: 7 %
Neutrophils Absolute: 4.4 10*3/uL (ref 1.4–7.0)
Neutrophils: 61 %
Platelets: 395 10*3/uL (ref 150–450)
RBC: 5.49 x10E6/uL (ref 4.14–5.80)
RDW: 14.6 % (ref 11.6–15.4)
WBC: 7.2 10*3/uL (ref 3.4–10.8)

## 2023-01-14 LAB — CMP14+EGFR
ALT: 89 [IU]/L — ABNORMAL HIGH (ref 0–44)
AST: 54 [IU]/L — ABNORMAL HIGH (ref 0–40)
Albumin: 3.9 g/dL (ref 3.8–4.9)
Alkaline Phosphatase: 144 [IU]/L — ABNORMAL HIGH (ref 44–121)
BUN/Creatinine Ratio: 9 (ref 9–20)
BUN: 14 mg/dL (ref 6–24)
Bilirubin Total: 0.3 mg/dL (ref 0.0–1.2)
CO2: 19 mmol/L — ABNORMAL LOW (ref 20–29)
Calcium: 9.3 mg/dL (ref 8.7–10.2)
Chloride: 101 mmol/L (ref 96–106)
Creatinine, Ser: 1.48 mg/dL — ABNORMAL HIGH (ref 0.76–1.27)
Globulin, Total: 3.6 g/dL (ref 1.5–4.5)
Glucose: 143 mg/dL — ABNORMAL HIGH (ref 70–99)
Potassium: 4.4 mmol/L (ref 3.5–5.2)
Sodium: 138 mmol/L (ref 134–144)
Total Protein: 7.5 g/dL (ref 6.0–8.5)
eGFR: 56 mL/min/{1.73_m2} — ABNORMAL LOW (ref >59)

## 2023-01-14 LAB — TSH: TSH: 1.49 u[IU]/mL (ref 0.450–4.500)

## 2023-01-14 MED ORDER — CIPROFLOXACIN HCL 500 MG PO TABS
500.0000 mg | ORAL_TABLET | Freq: Two times a day (BID) | ORAL | 0 refills | Status: AC
  Filled 2023-01-14: qty 20, 10d supply, fill #0

## 2023-01-14 MED ORDER — METRONIDAZOLE 500 MG PO TABS
500.0000 mg | ORAL_TABLET | Freq: Two times a day (BID) | ORAL | 0 refills | Status: DC
  Filled 2023-01-14: qty 20, 10d supply, fill #0

## 2023-01-14 MED ORDER — UP4 PROBIOTICS ADULT PO CAPS
2.0000 | ORAL_CAPSULE | Freq: Every day | ORAL | 0 refills | Status: DC
  Filled 2023-01-14: qty 100, fill #0

## 2023-01-14 MED ORDER — IOPAMIDOL (ISOVUE-300) INJECTION 61%
100.0000 mL | Freq: Once | INTRAVENOUS | Status: AC | PRN
Start: 2023-01-14 — End: 2023-01-14
  Administered 2023-01-14: 100 mL via INTRAVENOUS

## 2023-01-14 NOTE — Telephone Encounter (Signed)
Bruce Cabrera with [facility] calling to make sure we got report from CT Abd Pelvis.   IMPRESSION: 1. Segmental rectal wall thickening, with surrounding perirectal fat stranding and numerous subcentimeter lymph nodes. Given acute onset of symptoms, this could reflect proctocolitis. However, close follow-up is recommended to exclude underlying neoplasm. 2. Stable gynecomastia. 3.  Aortic Atherosclerosis (ICD10-I70.0).

## 2023-01-20 ENCOUNTER — Telehealth: Payer: Self-pay | Admitting: Podiatry

## 2023-01-20 ENCOUNTER — Other Ambulatory Visit: Payer: Self-pay

## 2023-01-20 ENCOUNTER — Other Ambulatory Visit: Payer: Self-pay | Admitting: Podiatry

## 2023-01-20 MED ORDER — OXYCODONE-ACETAMINOPHEN 5-325 MG PO TABS
1.0000 | ORAL_TABLET | Freq: Three times a day (TID) | ORAL | 0 refills | Status: DC | PRN
  Filled 2023-01-20: qty 30, 10d supply, fill #0

## 2023-01-20 NOTE — Telephone Encounter (Signed)
Patient would like to know if you could send a refill for his prescription. He is scheduled  for January to go over mri results that were done on the 23rd of November (They are not available) so in the meantime he would like to have some pain medication.   Thank you

## 2023-01-20 NOTE — Progress Notes (Signed)
Need to start tapering down off of pain medication. DC Percocet 10/325. Rx sent 5/325mg  #30 Q8H. F/u next scheduled appt  Felecia Shelling, DPM [facility]  Dr. Felecia Shelling, DPM    [address] [place], [place] [address]                Office [phone]  Fax [phone]

## 2023-01-21 ENCOUNTER — Other Ambulatory Visit: Payer: Self-pay

## 2023-01-21 ENCOUNTER — Ambulatory Visit: Payer: Self-pay | Admitting: Licensed Clinical Social Worker

## 2023-01-21 NOTE — Patient Outreach (Addendum)
  Care Coordination   Initial Visit Note   01/21/2023 Name: Bruce Cabrera MRN: [id number] DOB: [birth year]  Bruce Cabrera is a 57 y.o. year old male who sees Bruce Rigg, NP for primary care. I spoke with  Bruce Cabrera by phone today.  What matters to the patients health and wellness today?  Food Insecurities  and Housing    Goals Addressed             This Visit's Progress    Care Coordination Activities       Care Coordination Interventions: Patient stated that he works and makes to much to receive food stamps, he stated      Care Coordination Activities       Care Coordination Interventions: Patient stated that he is working but spends money on medication and bills and he is left with barely noting to but food, he has applied for food stamps but is over the income limit. SW discussed with the patient about the local and community food pantry's and will mail the resources. Patient stated that his transportation is the bus and that he has a bus pass SW completed the SDOH Question ans no other concerns  SW will follow back up with the patient in 2 weeks.         SDOH assessments and interventions completed:  Yes  SDOH Interventions Today    Flowsheet Row Most Recent Value  SDOH Interventions   Food Insecurity Interventions Other (Comment)  [SW will send food Resources]  Housing Interventions Other (Comment)  [SW will mail some low income housing]  Transportation Interventions Other (Comment)  [[place] has a bus pass]  Utilities Interventions Intervention Not Indicated        Care Coordination Interventions:  Yes, provided  Interventions Today    Flowsheet Row Most Recent Value  General Interventions   General Interventions Discussed/Reviewed General Interventions Discussed, Community Resources  [SW will send resources for food and housing]        Follow up plan: Follow up call scheduled for 02/04/2023 at 11:00 am    Encounter Outcome:  Patient Visit  Completed   Bruce Cooks, PhD [facility], [facility] Social Worker Direct Dial: [phone]  Fax: [phone]

## 2023-01-21 NOTE — Patient Instructions (Signed)
Visit Information  Thank you for taking time to visit with me today. Please don't hesitate to contact me if I can be of assistance to you.   Following are the goals we discussed today:   Goals Addressed             This Visit's Progress    Care Coordination Activities       Care Coordination Interventions: Patient stated that he works and makes to much to receive food stamps, he stated      Care Coordination Activities       Care Coordination Interventions: Patient stated that he is working but spends money on medication and bills and he is left with barely noting to but food, he has applied for food stamps but is over the income limit. SW discussed with the patient about the local and community food pantry's and will mail the resources. Patient stated that his transportation is the bus and that he has a bus pass SW completed the SDOH Question ans no other concerns  SW will follow back up with the patient in 2 weeks.         Our next appointment is by telephone on 02/04/2023 at 3:00 pm  Please call the care guide team at [phone] if you need to cancel or reschedule your appointment.   If you are experiencing a Mental Health or Behavioral Health Crisis or need someone to talk to, please call the Suicide and Crisis Lifeline: 988 go to [facility] [address] [place] [phone]) call 911  The patient verbalized understanding of instructions, educational materials, and care plan provided today and DECLINED offer to receive copy of patient instructions, educational materials, and care plan.   Jeanie Cooks, PhD [facility], [facility] Social Worker Direct Dial: [phone]  Fax: [phone]

## 2023-01-23 ENCOUNTER — Encounter: Payer: Self-pay | Admitting: Pharmacist

## 2023-01-23 ENCOUNTER — Ambulatory Visit: Payer: BC Managed Care – PPO | Attending: Nurse Practitioner | Admitting: Pharmacist

## 2023-01-23 ENCOUNTER — Other Ambulatory Visit: Payer: Self-pay

## 2023-01-23 ENCOUNTER — Encounter: Payer: Self-pay | Admitting: Internal Medicine

## 2023-01-23 DIAGNOSIS — Z7984 Long term (current) use of oral hypoglycemic drugs: Secondary | ICD-10-CM | POA: Diagnosis not present

## 2023-01-23 DIAGNOSIS — Z794 Long term (current) use of insulin: Secondary | ICD-10-CM

## 2023-01-23 DIAGNOSIS — I10 Essential (primary) hypertension: Secondary | ICD-10-CM | POA: Diagnosis not present

## 2023-01-23 DIAGNOSIS — Z7985 Long-term (current) use of injectable non-insulin antidiabetic drugs: Secondary | ICD-10-CM

## 2023-01-23 DIAGNOSIS — E785 Hyperlipidemia, unspecified: Secondary | ICD-10-CM

## 2023-01-23 DIAGNOSIS — E1165 Type 2 diabetes mellitus with hyperglycemia: Secondary | ICD-10-CM

## 2023-01-23 DIAGNOSIS — E1169 Type 2 diabetes mellitus with other specified complication: Secondary | ICD-10-CM

## 2023-01-23 MED ORDER — INSULIN GLARGINE (1 UNIT DIAL) 300 UNIT/ML ~~LOC~~ SOPN
32.0000 [IU] | PEN_INJECTOR | Freq: Every day | SUBCUTANEOUS | 4 refills | Status: DC
Start: 2023-01-23 — End: 2023-07-29
  Filled 2023-01-23 – 2023-02-03 (×2): qty 4.5, 42d supply, fill #0
  Filled 2023-04-14 (×2): qty 4.5, 42d supply, fill #1
  Filled 2023-05-26: qty 4.5, 42d supply, fill #2
  Filled 2023-07-24: qty 4.5, 42d supply, fill #3

## 2023-01-23 NOTE — Progress Notes (Signed)
S:     No chief complaint on file.  57 y.o. male who presents for diabetes evaluation, education, and management.   Patient was originally referred to me by Dr. Delford Field on 07/04/22. Patient was last seen by pharmacy service on 12/19/2022. Since then, he has established care with [facility] on 01/07/2023. A1c at that visit was 7.6% (down from 8.2 prior). Ozempic dose was increased to 1 mg weekly.  He was then seen by one of our covering providers here on 11/25 with abdominal discomfort and distension. Endorsed 7-10 day onset constipation but had not started the higher dose of Ozempic at that visit. 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 Patient has a referral for GI placed for CRC screening.   PMH is significant for HTN, T2DM, obesity, tobacco use, HLD.  Today, patient arrives in good spirits and presents without any assistance. Patient is tolerating Ozempic and denies n/v, diarrhea, or vision changes. He has increased to the 1mg  dose and has taken 2 injections so far. Reports near complete resolution of belly pain since completing antibiotics but continues with constipation. Is not currently taking anything to help him with his constipation.   Family/Social History:  Tobacco: smokes about 1-2 cigarette per day. 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   Insurance coverage: BCBS  Patient denies hypoglycemic events.  Patient reports nocturia (nighttime urination).  Patient denies neuropathy  (nerve pain). Patient denies visual changes. Patient reports self foot exams.   Dietary indiscretion with Thanksgiving.   Patient-reported exercise habits: not discussed this visit   O:  Libre3  CGM Download: 14 days -Avg glucose: 150 mg/dL  -Time above range: 19% -Time in target: 66% -GMI: 7.2%  Lab Results  Component Value Date   HGBA1C 7.6 (A) 01/07/2023   There were no vitals filed for this visit.   Lipid Panel     Component Value Date/Time   CHOL 141 12/19/2022 1004   TRIG 98 12/19/2022 1004   HDL 37 (L) 12/19/2022 1004   CHOLHDL 3.8 12/19/2022 1004   CHOLHDL 7.1 11/25/2013 1639   VLDL 20 11/25/2013 1639   LDLCALC 86 12/19/2022 1004    Clinical Atherosclerotic Cardiovascular Disease (ASCVD): No  The 10-year ASCVD risk score (Arnett DK, et al., 2019) is: 33.8%   Values used to calculate the score:     Age: 25 years     Sex: Male     Is Non-Hispanic African American: Yes     Diabetic: Yes     Tobacco smoker: Yes     Systolic Blood Pressure: 134 mmHg     Is BP treated: Yes     HDL Cholesterol: 37 mg/dL     Total Cholesterol: 141 mg/dL   Patient is participating in a Managed Medicaid Plan: no   A/P: Diabetes longstanding currently uncontrolled based on A1c, however, CGM report shows better control at home. Commended him for this! Patient is able to verbalize appropriate hypoglycemia management plan but has no hypoglycemia at this time. Medication adherence appears appropriate. I'd like to keep his Ozempic  at 1mg  weekly until we get to the bottom of his GI symptoms. I have provided him with the number to [facility] to schedule his colonoscopy. -Continue Toujeo (insulin glargine) 32units daily in the morning. -Continue Ozempic (semaglutide) 1mg  weekly.  -Continued Farxiga (dapagliflozin) 10 mg. Counseled on sick day rules. -Patient educated on purpose, proper use, and potential adverse effects of Ozempic.  -Extensively discussed pathophysiology of diabetes,  recommended lifestyle interventions, dietary effects on blood sugar control.  -Counseled on s/sx of and management of hypoglycemia.  -Next A1c anticipated 03/2023.   ASCVD risk - primary prevention in patient with diabetes. Last LDL is 86 not at goal of <70 mg/dL (May of this year). High intensity statin indicated.  Can consider addition of Zetia at future visit.  -Continued atorvastatin 80 mg daily  Written patient instructions provided. Patient verbalized understanding of treatment plan.  Total time in face to face counseling 30 minutes.    Follow-up:  Pharmacist in Jan. .  Butch Penny, PharmD, BCACP, CPP Clinical Pharmacist [facility] & [facility] [phone]

## 2023-01-23 NOTE — Patient Instructions (Signed)
Thank you for coming to see me today. Please do the following:  Continue Ozempic 1 mg once a week.  Continue Toujeo 32 units daily.  Continue Marcelline Deist one tablet daily.  For constipation, pick-up Metamucil Sugar-Free. Dissolve 1-2 teaspoons in 8 oz of cold water and drink twice a day. Make sure you're drinking 48 oz of water at least daily.  Continue checking blood sugars at home.

## 2023-01-24 ENCOUNTER — Other Ambulatory Visit: Payer: Self-pay

## 2023-02-03 ENCOUNTER — Other Ambulatory Visit: Payer: Self-pay

## 2023-02-04 ENCOUNTER — Ambulatory Visit: Payer: Self-pay | Admitting: Licensed Clinical Social Worker

## 2023-02-04 NOTE — Patient Outreach (Signed)
  Care Coordination   Follow Up Visit Note   02/04/2023 Name: Bruce Cabrera MRN: [id number] DOB: 1965/07/18  Bruce Cabrera is a 57 y.o. year old male who sees Claiborne Rigg, NP for primary care. I spoke with  Bruce Cabrera by phone today.  What matters to the patients health and wellness today?  Resources Mailed    Goals Addressed             This Visit's Progress    COMPLETED: Care Coordination Activities       Care Coordination Interventions: Patient stated that he works and makes to much to receive food stamps, he stated      COMPLETED: Care Coordination Activities       Care Coordination Interventions: Patient stated that he is working but spends money on medication and bills and he is left with barely noting to but food, he has applied for food stamps but is over the income limit. SW discussed with the patient about the local and community food pantry's and will mail the resources. Patient stated that his transportation is the bus and that he has a bus pass SW completed the SDOH Question ans no other concerns  SW will follow back up with the patient in 2 weeks.         SDOH assessments and interventions completed:  Yes  SDOH Interventions Today    Flowsheet Row Most Recent Value  SDOH Interventions   Food Insecurity Interventions Intervention Not Indicated        Care Coordination Interventions:  Yes, provided  Interventions Today    Flowsheet Row Most Recent Value  General Interventions   General Interventions Discussed/Reviewed General Interventions Reviewed  [Patient received resources]         Follow up plan: No further intervention required.   Encounter Outcome:  Patient Visit Completed   Jeanie Cooks, PhD [facility], [facility] Social Worker Direct Dial: [phone]  Fax: [phone]

## 2023-02-04 NOTE — Patient Instructions (Signed)
Visit Information  Thank you for taking time to visit with me today. Please don't hesitate to contact me if I can be of assistance to you.   Following are the goals we discussed today:   Goals Addressed             This Visit's Progress    COMPLETED: Care Coordination Activities       Care Coordination Interventions: Patient stated that he works and makes to much to receive food stamps, he stated      COMPLETED: Care Coordination Activities       Care Coordination Interventions: Patient stated that he is working but spends money on medication and bills and he is left with barely noting to but food, he has applied for food stamps but is over the income limit. SW discussed with the patient about the local and community food pantry's and will mail the resources. Patient stated that his transportation is the bus and that he has a bus pass SW completed the SDOH Question ans no other concerns  SW will follow back up with the patient in 2 weeks.         No further follow up needed  Please call the care guide team at [phone] if you need to cancel or reschedule your appointment.   If you are experiencing a Mental Health or Behavioral Health Crisis or need someone to talk to, please call the Suicide and Crisis Lifeline: 988 go to [facility] [address], [place] [phone]) call 911  The patient verbalized understanding of instructions, educational materials, and care plan provided today and DECLINED offer to receive copy of patient instructions, educational materials, and care plan.   Jeanie Cooks, PhD [facility], [facility] Social Worker Direct Dial: [phone]  Fax: [phone]

## 2023-02-06 ENCOUNTER — Other Ambulatory Visit: Payer: Self-pay

## 2023-02-07 ENCOUNTER — Other Ambulatory Visit: Payer: Self-pay

## 2023-02-07 ENCOUNTER — Encounter: Payer: Self-pay | Admitting: Internal Medicine

## 2023-02-10 ENCOUNTER — Telehealth: Payer: Self-pay | Admitting: Pharmacist

## 2023-02-10 ENCOUNTER — Other Ambulatory Visit: Payer: Self-pay

## 2023-02-10 ENCOUNTER — Telehealth: Payer: Self-pay | Admitting: Nurse Practitioner

## 2023-02-10 NOTE — Telephone Encounter (Signed)
Pt is calling in because he says he has an appointment for a colonoscopy coming up and doesn't know where to go or the phone number. Pt would like someone to call him with that information.

## 2023-02-10 NOTE — Telephone Encounter (Signed)
Returned pt's call. His appts are 02/28/2023, 2:30p. Has a follow-up with Dr. Leone Payor 03/21/23, 11a. Gave him the address and phone number to [facility].

## 2023-02-10 NOTE — Telephone Encounter (Signed)
Hey friend,   Per patient, they are stopping coverage of his Libre CGM but he is on insulin + a GLP-1 RA. Can we start a PA on the [place]?

## 2023-02-14 ENCOUNTER — Ambulatory Visit (HOSPITAL_COMMUNITY)
Admission: EM | Admit: 2023-02-14 | Discharge: 2023-02-14 | Disposition: A | Discharge status: Home / Self Care | Payer: BC Managed Care – PPO | Attending: Family Medicine | Admitting: Family Medicine

## 2023-02-14 ENCOUNTER — Other Ambulatory Visit: Payer: Self-pay

## 2023-02-14 ENCOUNTER — Encounter (HOSPITAL_COMMUNITY): Payer: Self-pay

## 2023-02-14 DIAGNOSIS — H1032 Unspecified acute conjunctivitis, left eye: Secondary | ICD-10-CM

## 2023-02-14 DIAGNOSIS — S0502XA Injury of conjunctiva and corneal abrasion without foreign body, left eye, initial encounter: Secondary | ICD-10-CM | POA: Diagnosis not present

## 2023-02-14 MED ORDER — ERYTHROMYCIN 5 MG/GM OP OINT
TOPICAL_OINTMENT | OPHTHALMIC | 0 refills | Status: DC
  Filled 2023-02-14: qty 3.5, 7d supply, fill #0

## 2023-02-14 MED ORDER — TETRACAINE HCL 0.5 % OP SOLN
OPHTHALMIC | Status: AC
  Filled 2023-02-14: qty 4

## 2023-02-14 MED ORDER — FLUORESCEIN SODIUM 1 MG OP STRP
ORAL_STRIP | OPHTHALMIC | Status: AC
  Filled 2023-02-14: qty 1

## 2023-02-14 NOTE — ED Triage Notes (Signed)
Patient here today with c/o left eye redness, drainage, and discomfort since this past weekend the day after bumping his eye on the car door. Patient states that he fine the day that he hit his eye but the day following, he started having redness and his eye is crusted over in the morning. He states that his vision is okay but he does have some sensitivity to light. He has been using an [address] which seems to help.

## 2023-02-14 NOTE — ED Provider Notes (Signed)
[facility]    CSN: [id number] Arrival date & time: 02/14/23  0803      History   Chief Complaint Chief Complaint  Patient presents with   Eye Problem    HPI Bruce Cabrera is a 57 y.o. male.   Patient had an eye injury 4 days ago.  He was getting something out of the back of his car and the back hatchback hit his head on the side of the left eye.  Initially it was just sore but the next day he woke up and it was crusted and swollen.  He got over-the-counter eyewash or lubricating drops and has been cleaning his eye several times a day.  The eyedrops are helpful but he is having worsening redness the eye is swelling more and it is painful.  Denies fever nor other symptoms.   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) 05/09/2022    Patient Active Problem List   Diagnosis Date Noted   Dental abscess 05/09/2022   Tobacco use 08/23/2020   Right hip pain 08/23/2020   Polycythemia 12/26/2015   Diabetes mellitus (HCC) 11/25/2013   Hyperlipidemia due to type 2 diabetes mellitus (HCC) 10/13/2006   Essential hypertension 10/13/2006   OBESITY 10/10/2006    Past Surgical History:  Procedure Laterality Date   FOOT SURGERY Left    LEFT HEART CATHETERIZATION WITH CORONARY ANGIOGRAM N/A 11/29/2013   Procedure: LEFT HEART CATHETERIZATION WITH CORONARY ANGIOGRAM;  Surgeon: Lennette Bihari, MD;  Location: [facility] CATH LAB;  Service: Cardiovascular;  Laterality: N/A;   None         Home Medications    Prior to Admission medications   Medication Sig Start Date End Date Taking?  Authorizing Provider  erythromycin ophthalmic ointment Place a 1/4 inch ribbon of ointment into the lower eyelid twice daily for  5-7 days. 02/14/23  Yes Prescilla Sours, FNP  Accu-Chek Softclix Lancets lancets Use as instructed 08/23/20   Storm Frisk, MD  amLODipine (NORVASC) 10 MG tablet Take 1 tablet (10 mg total) by mouth daily. 06/07/22   Storm Frisk, MD  atorvastatin (LIPITOR) 80 MG tablet Take 1 tablet (80 mg total) by mouth daily. 10/08/22   Storm Frisk, MD  Blood Glucose Monitoring Suppl (ACCU-CHEK GUIDE) w/Device KIT Use to check blood sugar twice daily 08/23/20   Storm Frisk, MD  buPROPion (WELLBUTRIN XL) 150 MG 24 hr tablet Take 1 tablet (150 mg total) by mouth daily. 12/23/22   Storm Frisk, MD  Continuous Glucose Receiver (FREESTYLE LIBRE 2 READER) DEVI Use to check blood sugar continuously throughout the day. Change sensors once every 14 days. 10/08/22   Storm Frisk, MD  Continuous Glucose Sensor (FREESTYLE LIBRE 2 SENSOR) MISC Use to check blood sugar continuously throughout the day. Change sensors once every 14 days. 10/08/22   Storm Frisk, MD  Continuous Glucose Sensor (FREESTYLE LIBRE 3 SENSOR) MISC Place 1 sensor on the skin every 14 days. Use to check glucose continuously 11/14/22   Storm Frisk, MD  dapagliflozin propanediol (  FARXIGA) 10 MG TABS tablet Take 1 tablet (10 mg total) by mouth daily before breakfast. 11/21/22   Storm Frisk, MD  glucose blood (ACCU-CHEK GUIDE) test strip Use as instructed 07/23/21   Storm Frisk, MD  insulin glargine, 1 Unit Dial, (TOUJEO) 300 UNIT/ML Solostar Pen Inject 32 Units into the skin at bedtime. 01/23/23 03/24/23  Hoy Register, MD  Insulin Pen Needle (TRUEPLUS 5-BEVEL PEN NEEDLES) 32G X 4 MM MISC Use to inject Toujeo once daily. 07/23/21   Storm Frisk, MD  Lancets Misc. (ACCU-CHEK SOFTCLIX LANCET DEV) KIT use as directed 10/29/21   Storm Frisk, MD  oxyCODONE-acetaminophen (PERCOCET) 5-325 MG  tablet Take 1 tablet by mouth every 8 (eight) hours as needed for severe pain (pain score 7-10). 01/20/23   Felecia Shelling, DPM  Semaglutide, 1 MG/DOSE, 4 MG/3ML SOPN Inject 1 mg as directed once a week. 01/07/23   Claiborne Rigg, NP  valsartan (DIOVAN) 40 MG tablet Take 1 tablet (40 mg total) by mouth daily. 11/14/22   Storm Frisk, MD    Family History Family History  Problem Relation Age of Onset   Heart attack Mother 60   Heart failure Brother 50   Stroke Sister     Social History Social History   Tobacco Use   Smoking status: Some Days    Current packs/day: 0.50    Average packs/day: 0.5 packs/day for 30.0 years (15.0 ttl pk-yrs)    Types: Cigarettes   Smokeless tobacco: Never  Vaping Use   Vaping status: Never Used  Substance Use Topics   Alcohol use: Yes    Comment: 2- tallboys a day   Drug use: No     Allergies   Trulicity [dulaglutide]   Review of Systems Review of Systems  Constitutional:  Negative for chills and fever.  HENT: Negative.    Eyes:  Positive for pain, discharge and redness.  Respiratory:  Positive for shortness of breath. Negative for cough.   Cardiovascular:  Positive for chest pain.  Gastrointestinal:  Positive for abdominal pain (Mild pain has been on an antibiotic for colitis.) and constipation. Negative for diarrhea, nausea and vomiting.  Genitourinary: Negative.   Musculoskeletal: Negative.   Neurological: Negative.   Hematological: Negative.      Physical Exam Triage Vital Signs ED Triage Vitals  Encounter Vitals Group     BP 02/14/23 0906 126/79     Systolic BP Percentile --      Diastolic BP Percentile --      Pulse Rate 02/14/23 0906 (!) 104     Resp 02/14/23 0906 16     Temp 02/14/23 0906 98.9 F (37.2 C)     Temp Source 02/14/23 0906 Oral     SpO2 02/14/23 0906 97 %     Weight 02/14/23 0903 279 lb (126.6 kg)     Height 02/14/23 0903 6\' 3"  (1.905 m)     Head Circumference --      Peak Flow --      Pain Score  02/14/23 0902 5     Pain Loc --      Pain Education --      Exclude from Growth Chart --    No data found.  Updated Vital Signs BP 126/79 (BP Location: Left Arm)   Pulse (!) 104   Temp 98.9 F (37.2 C) (Oral)   Resp 16   Ht 6\' 3"  (1.905 m)   Wt 279 lb (126.6 kg)  SpO2 97%   BMI 34.87 kg/m   Visual Acuity Right Eye Distance: 20/40 Left Eye Distance: 20/70 Bilateral Distance: 20/50  Right Eye Near:   Left Eye Near:    Bilateral Near:     Physical Exam Vitals and nursing note reviewed.  Constitutional:      General: He is not in acute distress.    Appearance: He is well-developed.  HENT:     Head: Normocephalic and atraumatic.     Right Ear: Hearing, tympanic membrane, ear canal and external ear normal.     Left Ear: Hearing, tympanic membrane, ear canal and external ear normal.     Nose: Nose normal.     Mouth/Throat:     Lips: Pink.     Mouth: Mucous membranes are moist.     Pharynx: Uvula midline.     Tonsils: No tonsillar exudate.  Eyes:     General:        Right eye: No discharge.        Left eye: Discharge present.    Conjunctiva/sclera:     Right eye: No chemosis or exudate.    Left eye: Chemosis and exudate present.     Pupils: Pupils are equal, round, and reactive to light.     Left eye: Corneal abrasion and fluorescein uptake present.  Cardiovascular:     Rate and Rhythm: Normal rate and regular rhythm.     Heart sounds: Normal heart sounds, S1 normal and S2 normal. No murmur heard. Pulmonary:     Effort: Pulmonary effort is normal. No respiratory distress.     Breath sounds: Normal breath sounds.  Abdominal:     General: Abdomen is flat.     Palpations: Abdomen is soft.     Tenderness: There is no abdominal tenderness.  Musculoskeletal:        General: No swelling.     Cervical back: Neck supple.  Lymphadenopathy:     Comments: No lymphadenopathy in the neck.  Skin:    General: Skin is warm and dry.     Capillary Refill: Capillary refill  takes less than 2 seconds.     Findings: No rash.  Neurological:     Mental Status: He is alert and oriented to person, place, and time.  Psychiatric:        Mood and Affect: Mood normal.      UC Treatments / Results  Labs (all labs ordered are listed, but only abnormal results are displayed) Labs Reviewed - No data to display  EKG   Radiology No results found.  Procedures Procedures (including critical care time)  Medications Ordered in UC Medications - No data to display  Initial Impression / Assessment and Plan / UC Course  I have reviewed the triage vital signs and the nursing notes.  Pertinent labs & imaging results that were available during my care of the patient were reviewed by me and considered in my medical decision making (see chart for details).   Final Clinical Impressions(s) / UC Diagnoses   Final diagnoses:  Abrasion of left cornea, initial encounter  Acute bacterial conjunctivitis of left eye     Discharge Instructions      Left Corneal Abrasion and Conjunctivitis: Rinse eyes with warm water at least twice daily.  May use over-the-counter dry eyedrops as needed for comfort.  Apply erythromycin ointment, a fourth of an inch, into the left lower eyelid twice daily.  Follow-up if symptoms do not resolve, if symptoms worsen or if  new symptoms occur.  Provided information on ophthalmology if needs to see an eye doctor.   Educated on corneal abrasions and conjunctivitis.  Discussed comfort measures.  Will treat with erythromycin ointment, 1/4 inch, into left lower eyelid, twice daily for 5 to 7 days.  Provided information on a possible referral to ophthalmology if needed.  Provided work excuse to return on Monday.  Follow-up here if symptoms do not resolve, if symptoms worsen or if new symptoms occur.  ED Prescriptions     Medication Sig Dispense Auth. Provider   erythromycin ophthalmic ointment Place a 1/4 inch ribbon of ointment into the lower eyelid  twice daily for  5-7 days. 3.5 g Prescilla Sours, FNP      PDMP not reviewed this encounter.   Prescilla Sours, FNP 02/14/23 [phone]

## 2023-02-14 NOTE — Discharge Instructions (Signed)
Left Corneal Abrasion and Conjunctivitis: Rinse eyes with warm water at least twice daily.  May use over-the-counter dry eyedrops as needed for comfort.  Apply erythromycin ointment, a fourth of an inch, into the left lower eyelid twice daily.  Follow-up if symptoms do not resolve, if symptoms worsen or if new symptoms occur.  Provided information on ophthalmology if needs to see an eye doctor.

## 2023-02-20 ENCOUNTER — Other Ambulatory Visit: Payer: Self-pay

## 2023-02-20 MED ORDER — PREDNISOLONE ACETATE 1 % OP SUSP
1.0000 [drp] | OPHTHALMIC | 1 refills | Status: DC
  Filled 2023-02-20: qty 5, 5d supply, fill #0
  Filled 2023-02-27: qty 5, 5d supply, fill #1

## 2023-02-22 DIAGNOSIS — H20012 Primary iridocyclitis, left eye: Secondary | ICD-10-CM | POA: Diagnosis not present

## 2023-02-22 DIAGNOSIS — H25813 Combined forms of age-related cataract, bilateral: Secondary | ICD-10-CM | POA: Diagnosis not present

## 2023-02-24 ENCOUNTER — Other Ambulatory Visit: Payer: Self-pay

## 2023-02-24 ENCOUNTER — Ambulatory Visit (INDEPENDENT_AMBULATORY_CARE_PROVIDER_SITE_OTHER): Payer: BC Managed Care – PPO | Admitting: Podiatry

## 2023-02-24 ENCOUNTER — Encounter: Payer: Self-pay | Admitting: Podiatry

## 2023-02-24 DIAGNOSIS — M25871 Other specified joint disorders, right ankle and foot: Secondary | ICD-10-CM | POA: Diagnosis not present

## 2023-02-24 DIAGNOSIS — D2371 Other benign neoplasm of skin of right lower limb, including hip: Secondary | ICD-10-CM

## 2023-02-24 NOTE — Progress Notes (Signed)
 Chief Complaint  Patient presents with   Routine Post Op    Patient states that he has been about the same nothinh has change really ,  very little pain patient states .    Subjective:  Patient presents today status post fibular sesamoidectomy of the right foot.  DOS: 08/29/2022.  Continues to have pain and tenderness associated to the right great toe joint.  Last visit MRI was ordered.  Patient states that after debridement of the skin lesion he does feel significant relief.  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) 05/09/2022    Past Surgical History:  Procedure Laterality Date   FOOT SURGERY Left    LEFT HEART CATHETERIZATION WITH CORONARY ANGIOGRAM N/A 11/29/2013   Procedure: LEFT HEART CATHETERIZATION WITH CORONARY ANGIOGRAM;  Surgeon: Debby DELENA Sor, MD;  Location: [facility] CATH LAB;  Service: Cardiovascular;  Laterality: N/A;   None      Allergies  Allergen Reactions   Trulicity  [Dulaglutide ] Other (See Comments)    Abdominal pain    Objective/Physical Exam Hyperkeratotic skin lesion noted to the plantar lateral aspect of the first MTP of the right foot overlying the fibular sesamoidal area.  Associated tenderness with palpation.  No open wound.  Radiographic Exam RT foot 09/04/2022:  Absence of the fibular sesamoid noted.  Impression: Interval resection of the fibular sesamoid right  MR TOES RIGHT WO CONTRAST 01/11/2023 IMPRESSION: 1. Mild flexor hallucis longus tenosynovitis both proximal to and distal to the first MTP joint. 2. Mildly thickened and indistinct adductor hallucis tendon, compatible  with tendinopathy 3. Absent lateral sesamoid of the first digit. Bifid medial sesamoid of the first digit without internal edema signal. 4. Small dorsal effusion of the first MTP joint. 5. Mild degenerative findings along the interphalangeal joint of the great toe.  Assessment: 1. s/p sesamoidectomy right. DOS: 08/29/2022 2.  Chronic sesamoiditis right first MTP 3.  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  -MRI reviewed.  No significant findings that would warrant surgery.  Patient is not interested in surgery and like to continue to pursue conservative management -Offloading dancers pad was applied to the diabetic insoles of the right foot to offload pressure from the first MTP.  The plantar aspect of the diabetic insole was also resected away using a rotary bur to alleviate pressure from the great toe joint -Excisional debridement of the hyperkeratotic lesion was performed today using a 312 scalpel without incident or bleeding.  Salicylic acid applied. -For now we will continue conservative care.  Return to clinic when he becomes symptomatic with the redevelopment of the callus formation  *Works at the [employer]  Thresa EMERSON Sar, DPM [facility]  Dr. Thresa EMERSON Sar, DPM    [address], [place] [address]  Office [phone]  Fax [phone]

## 2023-02-25 ENCOUNTER — Other Ambulatory Visit: Payer: Self-pay

## 2023-02-26 ENCOUNTER — Other Ambulatory Visit: Payer: Self-pay

## 2023-02-27 ENCOUNTER — Other Ambulatory Visit: Payer: Self-pay

## 2023-02-28 ENCOUNTER — Ambulatory Visit (AMBULATORY_SURGERY_CENTER): Payer: BC Managed Care – PPO

## 2023-02-28 ENCOUNTER — Other Ambulatory Visit: Payer: Self-pay

## 2023-02-28 VITALS — Ht 75.0 in | Wt 268.0 lb

## 2023-02-28 DIAGNOSIS — Z1211 Encounter for screening for malignant neoplasm of colon: Secondary | ICD-10-CM

## 2023-02-28 MED ORDER — NA SULFATE-K SULFATE-MG SULF 17.5-3.13-1.6 GM/177ML PO SOLN
1.0000 | Freq: Once | ORAL | 0 refills | Status: AC
  Filled 2023-02-28: qty 354, 2d supply, fill #0

## 2023-02-28 NOTE — Progress Notes (Signed)

## 2023-03-05 ENCOUNTER — Other Ambulatory Visit (HOSPITAL_COMMUNITY): Payer: Self-pay

## 2023-03-06 ENCOUNTER — Other Ambulatory Visit (HOSPITAL_BASED_OUTPATIENT_CLINIC_OR_DEPARTMENT_OTHER): Payer: Self-pay

## 2023-03-06 ENCOUNTER — Other Ambulatory Visit: Payer: Self-pay

## 2023-03-06 ENCOUNTER — Encounter: Payer: Self-pay | Admitting: Oncology

## 2023-03-07 ENCOUNTER — Telehealth: Payer: Self-pay | Admitting: Nurse Practitioner

## 2023-03-07 NOTE — Telephone Encounter (Signed)
Hey friend,   Can you call and see what his concern is? I am out of clinic this morning and will be in clinic this afternoon. I won't have time to call him today. If I know what he's calling about I can attempt to call him either at end of day today or Monday.

## 2023-03-07 NOTE — Telephone Encounter (Signed)
Copied from CRM [id number]. Topic: General - Other >> Mar 07, 2023 10:22 AM Epimenio Foot F wrote: Reason for CRM: Pt is calling in requesting that Franky Macho give him a call at his earliest convenience.

## 2023-03-10 ENCOUNTER — Other Ambulatory Visit: Payer: Self-pay

## 2023-03-14 ENCOUNTER — Other Ambulatory Visit (HOSPITAL_COMMUNITY): Payer: Self-pay

## 2023-03-17 ENCOUNTER — Encounter: Payer: Self-pay | Admitting: Internal Medicine

## 2023-03-18 ENCOUNTER — Other Ambulatory Visit: Payer: Self-pay

## 2023-03-20 ENCOUNTER — Ambulatory Visit: Payer: BC Managed Care – PPO | Attending: Nurse Practitioner | Admitting: Pharmacist

## 2023-03-20 ENCOUNTER — Encounter: Payer: Self-pay | Admitting: Pharmacist

## 2023-03-20 ENCOUNTER — Other Ambulatory Visit: Payer: Self-pay | Admitting: Nurse Practitioner

## 2023-03-20 VITALS — BP 136/82 | HR 89

## 2023-03-20 DIAGNOSIS — I10 Essential (primary) hypertension: Secondary | ICD-10-CM

## 2023-03-20 DIAGNOSIS — Z7984 Long term (current) use of oral hypoglycemic drugs: Secondary | ICD-10-CM | POA: Diagnosis not present

## 2023-03-20 DIAGNOSIS — Z7985 Long-term (current) use of injectable non-insulin antidiabetic drugs: Secondary | ICD-10-CM | POA: Diagnosis not present

## 2023-03-20 DIAGNOSIS — Z794 Long term (current) use of insulin: Secondary | ICD-10-CM | POA: Diagnosis not present

## 2023-03-20 DIAGNOSIS — E1165 Type 2 diabetes mellitus with hyperglycemia: Secondary | ICD-10-CM | POA: Diagnosis not present

## 2023-03-20 DIAGNOSIS — H538 Other visual disturbances: Secondary | ICD-10-CM

## 2023-03-20 NOTE — Progress Notes (Signed)
S:     No chief complaint on file.  58 y.o. male who presents for diabetes and HTN evaluation, education, and management.   Patient was originally referred to me by Dr. Delford Field on 07/04/22. Patient was last seen by pharmacy service on 01/23/2023.   He has established care with [facility] on 01/07/2023. A1c at that visit was 7.6% (down from 8.2 prior). Ozempic dose was increased to 1 mg weekly.  He was then seen by one of our covering providers here on 11/25 with abdominal discomfort and distension. Endorsed 7-10 day onset constipation but had not started the higher dose of Ozempic at that visit. 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 Patient has a scheduled colonoscopy tomorrow (03/21/2023) with Dr. Leone Payor.  PMH is significant for HTN, T2DM, obesity, tobacco use, HLD.  Today, patient arrives in good spirits and presents without any assistance. Patient is tolerating Ozempic and denies n/v, diarrhea. Of note, he omitted this week's dose in preparation for his colonoscopy. Despite this, his CGM shows a GMI of 6.5%! He is adherent to his insulin and [place]. He is requesting an Ophthalmologist referral today.   Family/Social History:  Tobacco: smokes about 1-2 cigarette per day. 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   Insurance coverage: BCBS  Patient denies hypoglycemic events.  Patient reports nocturia (nighttime urination).  Patient denies  neuropathy (nerve pain). Patient denies visual changes. Patient reports self foot exams.   Patient-reported exercise habits: not discussed this visit   O:   Lab Results  Component Value Date   HGBA1C 7.6 (A) 01/07/2023   Vitals:   03/20/23 1108 03/20/23 1114  BP: (!) 143/81 136/82  Pulse: 91 89   Lipid Panel     Component Value Date/Time   CHOL 141 12/19/2022 1004   TRIG 98 12/19/2022 1004   HDL 37 (L) 12/19/2022 1004   CHOLHDL 3.8 12/19/2022 1004   CHOLHDL 7.1 11/25/2013 1639   VLDL 20 11/25/2013 1639   LDLCALC 86 12/19/2022 1004    Clinical Atherosclerotic Cardiovascular Disease (ASCVD): No  The 10-year ASCVD risk score (Arnett DK, et al., 2019) is: 34.6%   Values used to calculate the score:     Age: 31 years     Sex: Male     Is Non-Hispanic African American: Yes     Diabetic: Yes     Tobacco smoker: Yes     Systolic Blood Pressure: 136 mmHg     Is BP treated: Yes     HDL Cholesterol: 37 mg/dL     Total Cholesterol: 141 mg/dL   Patient is participating in a Managed Medicaid Plan: no   A/P: Diabetes longstanding currently uncontrolled based on A1c, however, CGM report shows better control at home. Commended him for this! Patient is able to verbalize appropriate hypoglycemia management plan but has no hypoglycemia at this time. Medication adherence appears appropriate. He can resume Ozempic once he completes his colonoscopy tomorrow.  -Continue Toujeo (insulin glargine) 32units daily in the morning. -Continue to hold Ozempic (semaglutide)  1mg  for now. Can restart after colonoscopy is completed with Dr. Leone Payor.  -Continued Farxiga (dapagliflozin) 10 mg. Counseled on sick day rules. -Patient educated on purpose, proper use, and potential adverse effects of Ozempic.  -Extensively discussed pathophysiology of diabetes, recommended lifestyle interventions, dietary effects on blood sugar control.  -Counseled on s/sx of and management of hypoglycemia.  -Next A1c  anticipated 03/2023.  -Message sent to our referral coordinator and PCP for Ophthal referral.   ASCVD risk - primary prevention in patient with diabetes. Last LDL is 86 not at goal of <70 mg/dL (May of this year). High intensity statin indicated.  Can consider addition of Zetia at future visit.  -Continued atorvastatin 80 mg daily  HTN: longstanding, currently just above goal. Second BP check was closer to goal of <130/80 mmHg. Pt is adherent to his antihypertensives. Will continue current regimen for now and recheck in 1 month.  -Continue amlodipine 10 mg daily.  -Continue valsartan 40 mg daily.   Written patient instructions provided. Patient verbalized understanding of treatment plan.  Total time in face to face counseling 30 minutes.    Follow-up:  PCP in March (04/28/2023). Pharmacist in April.   Butch Penny, PharmD, Patsy Baltimore, CPP Clinical Pharmacist [facility] [phone]

## 2023-03-21 ENCOUNTER — Encounter: Payer: Self-pay | Admitting: Internal Medicine

## 2023-03-21 ENCOUNTER — Ambulatory Visit: Payer: BC Managed Care – PPO | Admitting: Internal Medicine

## 2023-03-21 VITALS — BP 140/86 | HR 92 | Temp 99.8°F | Resp 12 | Ht 75.0 in | Wt 268.0 lb

## 2023-03-21 DIAGNOSIS — K629 Disease of anus and rectum, unspecified: Secondary | ICD-10-CM

## 2023-03-21 DIAGNOSIS — D124 Benign neoplasm of descending colon: Secondary | ICD-10-CM | POA: Diagnosis not present

## 2023-03-21 DIAGNOSIS — Z860101 Personal history of adenomatous and serrated colon polyps: Secondary | ICD-10-CM

## 2023-03-21 DIAGNOSIS — Z800 Family history of malignant neoplasm of digestive organs: Secondary | ICD-10-CM | POA: Diagnosis not present

## 2023-03-21 DIAGNOSIS — Z1211 Encounter for screening for malignant neoplasm of colon: Secondary | ICD-10-CM | POA: Diagnosis not present

## 2023-03-21 DIAGNOSIS — L989 Disorder of the skin and subcutaneous tissue, unspecified: Secondary | ICD-10-CM

## 2023-03-21 DIAGNOSIS — D125 Benign neoplasm of sigmoid colon: Secondary | ICD-10-CM

## 2023-03-21 HISTORY — PX: COLONOSCOPY W/ POLYPECTOMY: SHX1380

## 2023-03-21 HISTORY — DX: Personal history of adenomatous and serrated colon polyps: Z86.0101

## 2023-03-21 MED ORDER — SODIUM CHLORIDE 0.9 % IV SOLN: 500.0000 mL | Freq: Once | INTRAVENOUS | Status: DC

## 2023-03-21 NOTE — Progress Notes (Signed)
[facility] History and Physical   Primary Care Physician:  Claiborne Rigg, NP   Reason for Procedure:   CRCA screening  Plan:    colonoscopy     HPI: Bruce Cabrera is a 58 y.o. male presenting for a screening colonoscopy  Had a ? Boil - nodule around anus and saw some blood on tissue recently ? 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 Authorizing Provider  Accu-Chek Softclix Lancets lancets Use as instructed 08/23/20   Storm Frisk, MD  amLODipine (NORVASC) 10 MG tablet Take 1 tablet (10 mg total) by mouth daily. 06/07/22   Storm Frisk, MD  atorvastatin (LIPITOR) 80 MG tablet Take 1 tablet (80 mg total) by mouth daily. 10/08/22   Storm Frisk, MD  Blood Glucose Monitoring Suppl (ACCU-CHEK GUIDE) w/Device KIT Use to check blood sugar twice daily 08/23/20   Storm Frisk, MD  buPROPion (WELLBUTRIN XL) 150 MG 24 hr tablet Take 1 tablet (150 mg total) by mouth daily. 12/23/22   Storm Frisk, MD  Continuous Glucose Receiver (FREESTYLE LIBRE 2 READER) DEVI Use to check blood sugar continuously throughout the day. Change sensors once every 14 days. 10/08/22   Storm Frisk, MD  Continuous Glucose Sensor (FREESTYLE LIBRE 2 SENSOR) MISC Use to check  blood sugar continuously throughout the day. Change sensors once every 14 days. 10/08/22   Storm Frisk, MD  Continuous Glucose Sensor (FREESTYLE LIBRE 3 SENSOR) MISC Place 1 sensor on the skin every 14 days. Use to check glucose continuously 11/14/22   Storm Frisk, MD  dapagliflozin propanediol (FARXIGA) 10 MG TABS tablet Take 1 tablet (10 mg total) by mouth daily before breakfast. 11/21/22   Storm Frisk, MD  erythromycin ophthalmic ointment Place a 1/4 inch ribbon of ointment into the lower eyelid twice daily for  5-7 days. Patient not taking: Reported on 02/28/2023 02/14/23   Prescilla Sours, FNP  glucose blood (ACCU-CHEK GUIDE) test strip Use as instructed 07/23/21   Storm Frisk, MD  insulin glargine, 1 Unit Dial, (TOUJEO) 300 UNIT/ML Solostar Pen Inject 32 Units into the skin at bedtime. 01/23/23 03/24/23  Hoy Register, MD  Insulin Pen Needle (TRUEPLUS 5-BEVEL PEN NEEDLES) 32G X 4 MM MISC Use to inject Toujeo once daily. 07/23/21   Storm Frisk, MD  Lancets Misc. (ACCU-CHEK SOFTCLIX LANCET DEV) KIT use as directed 10/29/21   Storm Frisk, MD  oxyCODONE-acetaminophen (PERCOCET) 5-325 MG tablet Take 1 tablet by mouth every 8 (eight) hours as needed for severe pain (pain score 7-10). Patient not taking: Reported on 02/28/2023 01/20/23   Felecia Shelling, DPM  prednisoLONE acetate (PRED FORTE) 1 % ophthalmic suspension Place 1 drop into the left eye  every hour as directed. 02/20/23     Semaglutide, 1 MG/DOSE, 4 MG/3ML SOPN Inject 1 mg as directed once a week. 01/07/23   Claiborne Rigg, NP  valsartan (DIOVAN) 40 MG tablet Take 1 tablet (40 mg total) by mouth daily. 11/14/22   Storm Frisk, MD    Current Outpatient Medications  Medication Sig Dispense Refill   Accu-Chek Softclix Lancets lancets Use as instructed 100 each 12   amLODipine (NORVASC) 10 MG tablet Take 1 tablet (10 mg total) by mouth daily. 90 tablet 2   atorvastatin (LIPITOR) 80 MG tablet Take 1 tablet (80 mg  total) by mouth daily. 90 tablet 3   Blood Glucose Monitoring Suppl (ACCU-CHEK GUIDE) w/Device KIT Use to check blood sugar twice daily 1 kit 0   buPROPion (WELLBUTRIN XL) 150 MG 24 hr tablet Take 1 tablet (150 mg total) by mouth daily. 60 tablet 2   Continuous Glucose Receiver (FREESTYLE LIBRE 2 READER) DEVI Use to check blood sugar continuously throughout the day. Change sensors once every 14 days. 1 each 0   Continuous Glucose Sensor (FREESTYLE LIBRE 2 SENSOR) MISC Use to check blood sugar continuously throughout the day. Change sensors once every 14 days. 2 each 6   Continuous Glucose Sensor (FREESTYLE LIBRE 3 SENSOR) MISC Place 1 sensor on the skin every 14 days. Use to check glucose continuously 2 each 6   dapagliflozin propanediol (FARXIGA) 10 MG TABS tablet Take 1 tablet (10 mg total) by mouth daily before breakfast. 30 tablet 4   glucose blood (ACCU-CHEK GUIDE) test strip Use as instructed 100 each 12   insulin glargine, 1 Unit Dial, (TOUJEO) 300 UNIT/ML Solostar Pen Inject 32 Units into the skin at bedtime. 4.5 mL 4   Insulin Pen Needle (TRUEPLUS 5-BEVEL PEN NEEDLES) 32G X 4 MM MISC Use to inject Toujeo once daily. 100 each 2   Lancets Misc. (ACCU-CHEK SOFTCLIX LANCET DEV) KIT use as directed 1 kit 0   valsartan (DIOVAN) 40 MG tablet Take 1 tablet (40 mg total) by mouth daily. 90 tablet 3   erythromycin ophthalmic ointment Place a 1/4 inch ribbon of ointment into the lower eyelid twice daily for  5-7 days. (Patient not taking: Reported on 03/21/2023) 3.5 g 0   oxyCODONE-acetaminophen (PERCOCET) 5-325 MG tablet Take 1 tablet by mouth every 8 (eight) hours as needed for severe pain (pain score 7-10). (Patient not taking: Reported on 03/21/2023) 30 tablet 0   prednisoLONE acetate (PRED FORTE) 1 % ophthalmic suspension Place 1 drop into the left eye every hour as directed. 5 mL 1   Semaglutide, 1 MG/DOSE, 4 MG/3ML SOPN Inject 1 mg as directed once a week. 3 mL 3   Current Facility-Administered  Medications  Medication Dose Route Frequency Provider Last Rate Last Admin   0.9 %  sodium chloride infusion  500 mL Intravenous Once Iva Boop, MD        Allergies as of 03/21/2023 - Review Complete 03/21/2023  Allergen Reaction Noted   Trulicity [dulaglutide] Other (See Comments) 09/10/2021    Family History  Problem Relation Age of Onset   Heart attack Mother 74   Stroke Sister    Heart failure Brother 21   Colon cancer Neg Hx    Rectal cancer Neg Hx    Stomach cancer Neg Hx    Esophageal cancer Neg Hx     Social History   Socioeconomic History   Marital status: Single    Spouse name: Not on  file   Number of children: Not on file   Years of education: Not on file   Highest education level: Not on file  Occupational History   Occupation: Science writer at [employer]  Tobacco Use   Smoking status: Some Days    Current packs/day: 0.50    Average packs/day: 0.5 packs/day for 30.0 years (15.0 ttl pk-yrs)    Types: Cigarettes   Smokeless tobacco: Never  Vaping Use   Vaping status: Never Used  Substance and Sexual Activity   Alcohol use: Yes    Comment: 2- tallboys a day   Drug use: No   Sexual activity: Not on file  Other Topics Concern   Not on file  Social History Narrative   Lives with roommate   Social Drivers of Health   Financial Resource Strain: High Risk (01/07/2023)   Overall Financial Resource Strain (CARDIA)    Difficulty of Paying Living Expenses: Very hard  Food Insecurity: No Food Insecurity (02/04/2023)   Hunger Vital Sign    Worried About Running Out of Food in the Last Year: Never true    Ran Out of Food in the Last Year: Never true  Recent Concern: Food Insecurity - Food Insecurity Present (01/21/2023)   Hunger Vital Sign    Worried About Running Out of Food in the Last Year: Sometimes true    Ran Out of Food in the Last Year: Sometimes true  Transportation Needs: No Transportation Needs (01/21/2023)   PRAPARE - Scientist, research (physical sciences) (Medical): No    Lack of Transportation (Non-Medical): No  Physical Activity: Inactive (01/07/2023)   Exercise Vital Sign    Days of Exercise per Week: 0 days    Minutes of Exercise per Session: 0 min  Stress: Stress Concern Present (01/07/2023)   [employer] of Occupational Health - Occupational Stress Questionnaire    Feeling of Stress : Very much  Social Connections: Socially Isolated (01/07/2023)   Social Connection and Isolation Panel [NHANES]    Frequency of Communication with Friends and Family: Once a week    Frequency of Social Gatherings with Friends and Family: Once a week    Attends Religious Services: Never    Database administrator or Organizations: No    Attends Banker Meetings: Never    Marital Status: Never married  Intimate Partner Violence: Not At Risk (01/21/2023)   Humiliation, Afraid, Rape, and Kick questionnaire    Fear of Current or Ex-Partner: No    Emotionally Abused: No    Physically Abused: No    Sexually Abused: No    Review of Systems:  All other review of systems negative except as mentioned in the HPI.  Physical Exam: Vital signs BP (!) 159/81   Pulse 100   Temp 99.8 F (37.7 C)   Resp 15   Ht 6\' 3"  (1.905 m)   Wt 268 lb (121.6 kg)   SpO2 98%   BMI 33.50 kg/m   General:   Alert,  Well-developed, well-nourished, pleasant and cooperative in NAD Lungs:  Clear throughout to auscultation.   Heart:  Regular rate and rhythm; no murmurs, clicks, rubs,  or gallops. Abdomen:  Soft, nontender and nondistended. Normal bowel sounds.   Neuro/Psych:  Alert and cooperative. Normal mood and affect. A and O x 3   @Coby Antrobus  Sena Slate, MD, [facility] [phone] (pager) 03/21/2023 10:45 AM@

## 2023-03-21 NOTE — Progress Notes (Signed)
CBG was 68 this am. Pt was shaking and anxious. Pt took half of his normal insulin dose this am. Physician notified and patient given half an amp of D50 per order. Will reassess CBG.

## 2023-03-21 NOTE — Op Note (Addendum)
[facility] Patient Name: Bruce Cabrera Procedure Date: 03/21/2023 10:39 AM MRN: [id number] Endoscopist: Iva Boop , MD, [id number] Age: 58 Referring MD:  Date of Birth: 11/18/1965 Gender: Male Account #: [id number] Procedure:                Colonoscopy Indications:              Screening for colorectal malignant neoplasm, This                            is the patient's first colonoscopy Medicines:                Monitored Anesthesia Care Procedure:                Pre-Anesthesia Assessment:                           - Prior to the procedure, a History and Physical                            was performed, and patient medications and                            allergies were reviewed. The patient's tolerance of                            previous anesthesia was also reviewed. The risks                            and benefits of the procedure and the sedation                            options and risks were discussed with the patient.                            All questions were answered, and informed consent                            was obtained. Prior Anticoagulants: The patient has                            taken no anticoagulant or antiplatelet agents. ASA                            Grade Assessment: II - A patient with mild systemic                            disease. After reviewing the risks and benefits,                            the patient was deemed in satisfactory condition to                            undergo the procedure.  After obtaining informed consent, the colonoscope                            was passed under direct vision. Throughout the                            procedure, the patient's blood pressure, pulse, and                            oxygen saturations were monitored continuously. The                            Colonoscope was introduced through the anus and                            advanced to the the cecum,  identified by                            appendiceal orifice and ileocecal valve. The                            colonoscopy was performed without difficulty. The                            patient tolerated the procedure well. The quality                            of the bowel preparation was adequate. The bowel                            preparation used was SUPREP via split dose                            instruction. The ileocecal valve, appendiceal                            orifice, and rectum were photographed. Scope In: 10:56:33 AM Scope Out: 11:16:37 AM Scope Withdrawal Time: 0 hours 16 minutes 4 seconds  Total Procedure Duration: 0 hours 20 minutes 4 seconds  Findings:                 The perianal exam findings include multiple                            hypopigmented flat and some papular lesions.                           Two sessile polyps were found in the sigmoid colon                            and descending colon. The polyps were diminutive in                            size. These polyps were removed with a cold snare.  Resection and retrieval were complete. Verification                            of patient identification for the specimen was                            done. Estimated blood loss was minimal.                           The exam was otherwise without abnormality on                            direct and retroflexion views. Complications:            No immediate complications. Estimated Blood Loss:     Estimated blood loss was minimal. Impression:               - Multiple hypopigmented flat and some papular                            lesions. found on perianal exam.                           - Two diminutive polyps in the sigmoid colon and in                            the descending colon, removed with a cold snare.                            Resected and retrieved.                           - The examination was otherwise  normal on direct                            and retroflexion views.                           ALSO HAS FAMILY HX CRCA IN SISTER - WE FOUND OUT IN                            RECOVERY Recommendation:           - Patient has a contact number available for                            emergencies. The signs and symptoms of potential                            delayed complications were discussed with the                            patient. Return to normal activities tomorrow.                            Written discharge instructions were  provided to the                            patient.                           - Resume previous diet.                           - Continue present medications.                           - Await pathology results.                           - Repeat colonoscopy is recommended for                            surveillance. The colonoscopy date will be                            determined after pathology results from today's                            exam become available for review.                           - return to primary care to assess perianal skin                            changes - he says he has noticed irritation there x                            2 weeks or so Iva Boop, MD 03/21/2023 11:34:58 AM This report has been signed electronically.

## 2023-03-21 NOTE — Progress Notes (Signed)
 Called to room to assist during endoscopic procedure.  Patient ID and intended procedure confirmed with present staff. Received instructions for my participation in the procedure from the performing physician.

## 2023-03-21 NOTE — Patient Instructions (Addendum)
There were two (2) tiny polyps removed. They look benign. I will let you know pathology results and when to have another routine colonoscopy by mail and/or My Chart.  There is an abnormality of the perianal skin that I doubt is a significant problem but I am not sure so will ask you to be seen by Claiborne Rigg, NP to get her opinion. I am messaging her but suggest that you call for an appointment also.  You can try Desitin ointment on the skin that is affected.  I appreciate the opportunity to care for you. Iva Boop, MD, [facility]  Resume previous diet Continue present medications Await pathology results Handouts/information given for polyps  YOU HAD AN ENDOSCOPIC PROCEDURE TODAY AT THE [facility]:   Refer to the procedure report that was given to you for any specific questions about what was found during the examination.  If the procedure report does not answer your questions, please call your gastroenterologist to clarify.  If you requested that your care partner not be given the details of your procedure findings, then the procedure report has been included in a sealed envelope for you to review at your convenience later.  YOU SHOULD EXPECT: Some feelings of bloating in the abdomen. Passage of more gas than usual.  Walking can help get rid of the air that was put into your GI tract during the procedure and reduce the bloating. If you had a lower endoscopy (such as a colonoscopy or flexible sigmoidoscopy) you may notice spotting of blood in your stool or on the toilet paper. If you underwent a bowel prep for your procedure, you may not have a normal bowel movement for a few days.  Please Note:  You might notice some irritation and congestion in your nose or some drainage.  This is from the oxygen used during your procedure.  There is no need for concern and it should clear up in a day or so.  SYMPTOMS TO REPORT IMMEDIATELY: Following lower endoscopy  (colonoscopy):  Excessive amounts of blood in the stool  Significant tenderness or worsening of abdominal pains  Swelling of the abdomen that is new, acute  Fever of 100F or higher  For urgent or emergent issues, a gastroenterologist can be reached at any hour by calling [phone]. Do not use MyChart messaging for urgent concerns.   DIET:  We do recommend a small meal at first, but then you may proceed to your regular diet.  Drink plenty of fluids but you should avoid alcoholic beverages for 24 hours.  ACTIVITY:  You should plan to take it easy for the rest of today and you should NOT DRIVE or use heavy machinery until tomorrow (because of the sedation medicines used during the test).    FOLLOW UP: Our staff will call the number listed on your records the next business day following your procedure.  We will call around 7:15- 8:00 am to check on you and address any questions or concerns that you may have regarding the information given to you following your procedure. If we do not reach you, we will leave a message.     If any biopsies were taken you will be contacted by phone or by letter within the next 1-3 weeks.  Please call us at [phone] if you have not heard about the biopsies in 3 weeks.   SIGNATURES/CONFIDENTIALITY: You and/or your care partner have signed paperwork which will be entered into your electronic medical record.  These signatures attest to the fact that that the information above on your After Visit Summary has been reviewed and is understood.  Full responsibility of the confidentiality of this discharge information lies with you and/or your care-partner.

## 2023-03-21 NOTE — Progress Notes (Signed)
 Report to PACU, RN, vss, BBS= Clear.

## 2023-03-24 ENCOUNTER — Telehealth: Payer: Self-pay

## 2023-03-24 ENCOUNTER — Other Ambulatory Visit: Payer: Self-pay

## 2023-03-24 NOTE — Telephone Encounter (Signed)
  Follow up Call-     03/21/2023   10:11 AM  Call back number  Post procedure Call Back phone  # [phone]  Permission to leave phone message Yes     Patient questions:  Do you have a fever, pain , or abdominal swelling? No. Pain Score  0 *  Have you tolerated food without any problems? Yes.    Have you been able to return to your normal activities? Yes.    Do you have any questions about your discharge instructions: Diet   No. Medications  No. Follow up visit  No.  Do you have questions or concerns about your Care? No.  Actions: * If pain score is 4 or above: No action needed, pain <4.

## 2023-03-25 LAB — SURGICAL PATHOLOGY

## 2023-03-26 ENCOUNTER — Encounter: Payer: Self-pay | Admitting: Internal Medicine

## 2023-03-26 ENCOUNTER — Other Ambulatory Visit: Payer: Self-pay

## 2023-03-26 DIAGNOSIS — H209 Unspecified iridocyclitis: Secondary | ICD-10-CM | POA: Diagnosis not present

## 2023-03-26 DIAGNOSIS — Z860101 Personal history of adenomatous and serrated colon polyps: Secondary | ICD-10-CM

## 2023-03-26 DIAGNOSIS — H2513 Age-related nuclear cataract, bilateral: Secondary | ICD-10-CM | POA: Diagnosis not present

## 2023-03-26 LAB — HM DIABETES EYE EXAM

## 2023-03-26 MED ORDER — PREDNISOLONE ACETATE 1 % OP SUSP
OPHTHALMIC | 0 refills | Status: DC
  Filled 2023-03-26: qty 10, 25d supply, fill #0

## 2023-03-26 MED ORDER — ATROPINE SULFATE 1 % OP SOLN
1.0000 [drp] | Freq: Two times a day (BID) | OPHTHALMIC | 0 refills | Status: DC
  Filled 2023-03-26: qty 6, 30d supply, fill #0

## 2023-03-27 ENCOUNTER — Other Ambulatory Visit: Payer: Self-pay

## 2023-03-28 ENCOUNTER — Other Ambulatory Visit: Payer: Self-pay

## 2023-04-01 ENCOUNTER — Other Ambulatory Visit: Payer: Self-pay

## 2023-04-01 DIAGNOSIS — E119 Type 2 diabetes mellitus without complications: Secondary | ICD-10-CM | POA: Diagnosis not present

## 2023-04-01 DIAGNOSIS — A523 Neurosyphilis, unspecified: Secondary | ICD-10-CM | POA: Diagnosis not present

## 2023-04-01 DIAGNOSIS — H3581 Retinal edema: Secondary | ICD-10-CM | POA: Diagnosis not present

## 2023-04-01 DIAGNOSIS — H30033 Focal chorioretinal inflammation, peripheral, bilateral: Secondary | ICD-10-CM | POA: Diagnosis not present

## 2023-04-01 MED ORDER — ATROPINE SULFATE 1 % OP SOLN
1.0000 [drp] | Freq: Two times a day (BID) | OPHTHALMIC | 3 refills | Status: DC
Start: 2023-04-01 — End: 2023-04-22

## 2023-04-01 MED ORDER — DORZOLAMIDE HCL-TIMOLOL MAL 2-0.5 % OP SOLN
1.0000 [drp] | Freq: Two times a day (BID) | OPHTHALMIC | 11 refills | Status: DC
  Filled 2023-04-01: qty 10, 50d supply, fill #0
  Filled 2023-10-26: qty 10, 50d supply, fill #1

## 2023-04-01 MED ORDER — PREDNISOLONE ACETATE 1 % OP SUSP
1.0000 [drp] | Freq: Four times a day (QID) | OPHTHALMIC | 4 refills | Status: DC
Start: 2023-04-01 — End: 2023-04-22
  Filled 2023-04-01: qty 15, 38d supply, fill #0

## 2023-04-01 MED ORDER — AZATHIOPRINE 50 MG PO TABS
50.0000 mg | ORAL_TABLET | Freq: Every day | ORAL | 3 refills | Status: DC
  Filled 2023-04-01: qty 90, 90d supply, fill #0

## 2023-04-01 MED ORDER — PREDNISONE 10 MG PO TABS
ORAL_TABLET | ORAL | 0 refills | Status: AC
Start: 2023-04-01 — End: 2023-04-30
  Filled 2023-04-01: qty 70, 28d supply, fill #0

## 2023-04-02 ENCOUNTER — Other Ambulatory Visit: Payer: Self-pay

## 2023-04-04 ENCOUNTER — Other Ambulatory Visit: Payer: Self-pay

## 2023-04-04 ENCOUNTER — Encounter: Payer: Self-pay | Admitting: Oncology

## 2023-04-08 ENCOUNTER — Other Ambulatory Visit: Payer: Self-pay

## 2023-04-09 ENCOUNTER — Other Ambulatory Visit: Payer: Self-pay | Admitting: Pharmacist

## 2023-04-09 ENCOUNTER — Encounter: Payer: Self-pay | Admitting: Oncology

## 2023-04-09 ENCOUNTER — Other Ambulatory Visit: Payer: Self-pay

## 2023-04-09 MED ORDER — ONETOUCH DELICA PLUS LANCET33G MISC
6 refills | Status: DC
  Filled 2023-04-09: qty 100, fill #0

## 2023-04-09 MED ORDER — DEXCOM G7 RECEIVER DEVI
0 refills | Status: DC
  Filled 2023-04-09: qty 1, fill #0
  Filled 2023-04-24: qty 1, 90d supply, fill #0
  Filled 2023-08-27: qty 1, 30d supply, fill #0

## 2023-04-09 MED ORDER — ONETOUCH VERIO VI STRP
ORAL_STRIP | 6 refills | Status: DC
Start: 2023-04-09 — End: 2023-04-28
  Filled 2023-04-09: qty 100, fill #0

## 2023-04-09 MED ORDER — DEXCOM G7 SENSOR MISC
6 refills | Status: DC
  Filled 2023-04-09: qty 3, fill #0
  Filled 2023-04-09 – 2023-04-24 (×2): qty 3, 30d supply, fill #0
  Filled 2023-05-20 – 2023-06-03 (×2): qty 3, 30d supply, fill #1
  Filled 2023-07-07: qty 3, 30d supply, fill #2
  Filled 2023-07-30: qty 3, 30d supply, fill #3
  Filled 2023-08-06: qty 3, 30d supply, fill #0
  Filled 2023-09-01: qty 3, 30d supply, fill #1
  Filled 2023-10-03: qty 3, 30d supply, fill #2
  Filled 2023-10-26: qty 3, 30d supply, fill #3

## 2023-04-09 MED ORDER — ONETOUCH VERIO W/DEVICE KIT
PACK | 0 refills | Status: DC
  Filled 2023-04-09: qty 1, fill #0

## 2023-04-10 ENCOUNTER — Encounter: Payer: Self-pay | Admitting: Oncology

## 2023-04-10 ENCOUNTER — Emergency Department (HOSPITAL_COMMUNITY)
Admission: EM | Admit: 2023-04-10 | Discharge: 2023-04-10 | Disposition: A | Discharge status: Home / Self Care | Payer: BC Managed Care – PPO | Attending: Emergency Medicine | Admitting: Emergency Medicine

## 2023-04-10 ENCOUNTER — Telehealth: Payer: Self-pay | Admitting: Surgery

## 2023-04-10 ENCOUNTER — Other Ambulatory Visit: Payer: Self-pay

## 2023-04-10 ENCOUNTER — Telehealth: Payer: Self-pay

## 2023-04-10 DIAGNOSIS — A523 Neurosyphilis, unspecified: Secondary | ICD-10-CM | POA: Diagnosis not present

## 2023-04-10 DIAGNOSIS — Z79899 Other long term (current) drug therapy: Secondary | ICD-10-CM | POA: Insufficient documentation

## 2023-04-10 DIAGNOSIS — Z7984 Long term (current) use of oral hypoglycemic drugs: Secondary | ICD-10-CM | POA: Insufficient documentation

## 2023-04-10 DIAGNOSIS — F17210 Nicotine dependence, cigarettes, uncomplicated: Secondary | ICD-10-CM | POA: Insufficient documentation

## 2023-04-10 DIAGNOSIS — H538 Other visual disturbances: Secondary | ICD-10-CM | POA: Diagnosis not present

## 2023-04-10 DIAGNOSIS — Z794 Long term (current) use of insulin: Secondary | ICD-10-CM | POA: Insufficient documentation

## 2023-04-10 DIAGNOSIS — E119 Type 2 diabetes mellitus without complications: Secondary | ICD-10-CM | POA: Diagnosis not present

## 2023-04-10 DIAGNOSIS — I10 Essential (primary) hypertension: Secondary | ICD-10-CM | POA: Insufficient documentation

## 2023-04-10 DIAGNOSIS — A539 Syphilis, unspecified: Secondary | ICD-10-CM | POA: Diagnosis not present

## 2023-04-10 DIAGNOSIS — A5143 Secondary syphilitic oculopathy: Secondary | ICD-10-CM | POA: Insufficient documentation

## 2023-04-10 LAB — CBC
HCT: 34.7 % — ABNORMAL LOW (ref 39.0–52.0)
Hemoglobin: 11.3 g/dL — ABNORMAL LOW (ref 13.0–17.0)
MCH: 23.5 pg — ABNORMAL LOW (ref 26.0–34.0)
MCHC: 32.6 g/dL (ref 30.0–36.0)
MCV: 72.1 fL — ABNORMAL LOW (ref 80.0–100.0)
Platelets: 407 10*3/uL — ABNORMAL HIGH (ref 150–400)
RBC: 4.81 MIL/uL (ref 4.22–5.81)
RDW: 17.5 % — ABNORMAL HIGH (ref 11.5–15.5)
WBC: 9.4 10*3/uL (ref 4.0–10.5)
nRBC: 0 % (ref 0.0–0.2)

## 2023-04-10 LAB — COMPREHENSIVE METABOLIC PANEL
ALT: 17 U/L (ref 0–44)
AST: 17 U/L (ref 15–41)
Albumin: 2.9 g/dL — ABNORMAL LOW (ref 3.5–5.0)
Alkaline Phosphatase: 40 U/L (ref 38–126)
Anion gap: 8 (ref 5–15)
BUN: 18 mg/dL (ref 6–20)
CO2: 22 mmol/L (ref 22–32)
Calcium: 8.8 mg/dL — ABNORMAL LOW (ref 8.9–10.3)
Chloride: 108 mmol/L (ref 98–111)
Creatinine, Ser: 1.29 mg/dL — ABNORMAL HIGH (ref 0.61–1.24)
GFR, Estimated: >60 mL/min (ref >60)
Glucose, Bld: 137 mg/dL — ABNORMAL HIGH (ref 70–99)
Potassium: 4.1 mmol/L (ref 3.5–5.1)
Sodium: 138 mmol/L (ref 135–145)
Total Bilirubin: 0.7 mg/dL (ref 0.0–1.2)
Total Protein: 6.3 g/dL — ABNORMAL LOW (ref 6.5–8.1)

## 2023-04-10 NOTE — Consult Note (Signed)
[facility]    Date of Admission:  04/10/2023     Total days of antibiotics               Reason for Consult: ocular syphilis concern?     Referring Provider: Theresia Lo Primary Care Provider: Claiborne Rigg, NP   Assessment: Bruce Cabrera is a 58 y.o. male seen with outpatient retinal specialist for worsening of blurry vision, eye pain and inflammation that has been ongoing since December 2024.    Syphilitic Uveitis -  RPR increasing the last week 1:64 --> 1:128 per HD recently. He declined LP for more definative work up to diagnose the eye inflammation seen but with labs being strongly positive and what sounds like syphilitic chancre recently it is reasonable to start this treatment and see how this improves.  -Please schedule follow up with Dr. Sherryll Burger within a week to follow up eye exam on penicillin  -We will plan to treat him with 2 weeks of IV penicillin continuously. We discussed PICC Line with him and will work on involving home health team.   Vascular Access -  -Arranged outpatient PICC line to be placed at [facility] at 2:30 tomorrow Friday 2/21. They kindly called him and gave him appointment specifics.   Home Health Coordination -  Will work with Elita Quick on securing home health and medication dispense.  He is insured through [employer] upon reviewing chart   General Screening -  Hepatitis screening negative  HIV non-reactive Would like to get him plugged in for HIV prevention with our PrEP clinic   Plan: Opat as below PICC line ordered for outpatient placement  Can discharge from ER and we will work with him further outpatient.    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 male who was sent to ER from health department for evaluation and treatment of neurosyphilis.   Mr Hashemi says that his vision problems started in December in the left eye after trauma during Christmas. This eventually involved the right eye as well and pain became more progressive. Evaluated at Dr. Laruth Bouchard office and referred to Dr. Sherryll Burger in [facility] to look. Work up at that time revealed RPR reactive with titer 1:64 on 04/01/23 with reactive treponemal Ab. Repeat at [facility] titer more recently 1:128. He has never had syphilis before. Noted some changes to skin on penis  about 1 week ago that seemed to self resolve with some over the counter cream. He does have sex with men and women. Most recently indicated 1 male partner. HIV test non-reactive on 4th generation test 04/01/23.   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 He has not had anything to eat since 10pm yesterday and is quite overwhelmed and tired. He does not want to consider doing a lumbar puncture. He would really like to go home to talk with family and let the know where he is and check in. He is asking for an opportunity to come back tomorrow to help coordinate care so he can continue to work.    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) 05/09/2022    Social History   Tobacco Use   Smoking status: Some Days    Current packs/day: 0.50    Average packs/day: 0.5 packs/day for 30.0 years (15.0 ttl pk-yrs)    Types: Cigarettes   Smokeless tobacco: Never  Vaping Use   Vaping status: Never Used  Substance Use Topics   Alcohol use: Yes    Comment: 2- tallboys a day   Drug use: No    Family History  Problem Relation Age of Onset   Heart attack Mother 66   Colon polyps Sister    Colon cancer Sister    Stroke Sister    Heart failure Brother 40   Rectal cancer Neg Hx    Stomach cancer Neg Hx    Esophageal cancer Neg Hx    Allergies  Allergen Reactions   Trulicity [Dulaglutide] Other (See Comments)    Abdominal pain    OBJECTIVE: Blood pressure (!) 160/60, pulse 87, temperature 98.3 F (36.8 C), resp. rate 17, SpO2 100%.  Physical Exam Eyes:     Extraocular Movements: Extraocular movements intact.     Conjunctiva/sclera:     Right eye: Right conjunctiva is injected. Exudate present.     Left eye: Left conjunctiva is injected. Exudate present.     Lab Results Lab Results  Component Value Date   WBC 9.4 04/10/2023   HGB 11.3 (L) 04/10/2023   HCT 34.7 (L) 04/10/2023   MCV 72.1 (L) 04/10/2023   PLT 407 (H) 04/10/2023    Lab Results  Component Value Date   CREATININE 1.29 (H) 04/10/2023   BUN 18 04/10/2023   NA 138 04/10/2023   K 4.1 04/10/2023   CL 108 04/10/2023   CO2 22 04/10/2023    Lab Results  Component Value Date   ALT 17  04/10/2023   AST 17 04/10/2023   ALKPHOS 40 04/10/2023   BILITOT 0.7 04/10/2023     Microbiology: No results found for this or any previous visit (from the past 240 hours).  Rexene Alberts, MSN, NP-C [facility] for Infectious Disease [facility] Pager: [phone]  04/10/2023 3:42 PM

## 2023-04-10 NOTE — ED Provider Notes (Signed)
[place] EMERGENCY DEPARTMENT AT [facility] Provider Note   CSN: [id number] Arrival date & time: 04/10/23  1132     History  Chief Complaint  Patient presents with   Eye Problem    Bruce Cabrera is a 58 y.o. male.  Patient is a 58 year old male with a past medical history of hypertension and diabetes that presented to the emergency department with concern for neurosyphilis.  The patient states that in December he initially had an eye injury to his left eye after hitting it on the car door.  He had been following with his ophthalmologist and started to then have pain in the right eye as well.  He states he will occasionally get spots in his vision.  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  The patient has no other neurologic complaints at this time.  He states that he did receive his first dose of penicillin IM today at the infectious disease clinic and then was recommended to come to the emergency department for further evaluation.   Eye Problem      Home Medications Prior to Admission medications   Medication Sig Start Date End Date Taking? Authorizing Provider  amLODipine (NORVASC) 10 MG tablet Take 1 tablet (10 mg total) by mouth daily. 06/07/22   Storm Frisk, MD  atorvastatin (LIPITOR) 80 MG tablet Take 1 tablet (80 mg total) by mouth daily. 10/08/22   Storm Frisk, MD  atropine 1 % ophthalmic solution Place 1 drop into both eyes 2 (two) times daily. 04/01/23     azaTHIOprine (IMURAN) 50 MG tablet Take 1 tablet (50 mg total) by mouth daily. 04/01/23     Blood Glucose Monitoring Suppl (ONETOUCH VERIO) w/Device KIT Use to check blood sugar three times daily. 04/09/23   Hoy Register, MD  buPROPion (WELLBUTRIN XL) 150 MG 24 hr tablet  Take 1 tablet (150 mg total) by mouth daily. 12/23/22   Storm Frisk, MD  Continuous Glucose Receiver (DEXCOM G7 RECEIVER) DEVI Use to check blood glucose continuously. 04/09/23   Hoy Register, MD  Continuous Glucose Sensor (DEXCOM G7 SENSOR) MISC Use to check blood glucose throughout the day. Changes sensors once every 10 days. 04/09/23   Hoy Register, MD  Continuous Glucose Sensor (FREESTYLE LIBRE 3 SENSOR) MISC Place 1 sensor on the skin every 14 days. Use to check glucose continuously 11/14/22   Storm Frisk, MD  dapagliflozin propanediol (FARXIGA) 10 MG TABS tablet Take 1 tablet (10 mg total) by mouth daily before breakfast. 11/21/22   Storm Frisk, MD  dorzolamide-timolol (COSOPT) 2-0.5 % ophthalmic solution Place 1 drop into both eyes 2 (two) times daily. 04/01/23     erythromycin ophthalmic ointment Place a 1/4 inch ribbon of ointment into the lower eyelid twice daily for  5-7 days. Patient not taking: Reported on 03/21/2023 02/14/23   Prescilla Sours, FNP  glucose blood (ONETOUCH VERIO) test strip Use to check blood sugar three times daily. 04/09/23   Hoy Register, MD  insulin glargine, 1 Unit Dial, (TOUJEO) 300 UNIT/ML Solostar Pen Inject 32 Units into the skin at bedtime. 01/23/23 03/24/23  Hoy Register, MD  Insulin Pen Needle (TRUEPLUS 5-BEVEL PEN NEEDLES) 32G X 4 MM MISC Use to inject Toujeo once daily. 07/23/21   Delford Field,  Charlcie Cradle, MD  Lancets [facility] DELICA PLUS [place]) MISC Use to check blood sugar three times daily. 04/09/23   Hoy Register, MD  Lancets Misc. (ACCU-CHEK SOFTCLIX LANCET DEV) KIT use as directed 10/29/21   Storm Frisk, MD  oxyCODONE-acetaminophen (PERCOCET) 5-325 MG tablet Take 1 tablet by mouth every 8 (eight) hours as needed for severe pain (pain score 7-10). Patient not taking: Reported on 03/21/2023 01/20/23   Felecia Shelling, DPM  prednisoLONE acetate (PRED FORTE) 1 % ophthalmic suspension Instill 1 drop into both eyes four times a day 03/26/23      prednisoLONE acetate (PRED FORTE) 1 % ophthalmic suspension Place 1 drop into both eyes 4 (four) times daily. 04/01/23     predniSONE (DELTASONE) 10 MG tablet TAKE 4 tablets (40 mg total) BY MOUTH daily for 7 days, THEN 3 tablets (30 mg total) daily for 7 days, THEN 2 tablets (20 mg total) daily for 7 days, THEN 1 tablet (10 mg total) daily for 7 days. 04/01/23 04/30/23    Semaglutide, 1 MG/DOSE, 4 MG/3ML SOPN Inject 1 mg as directed once a week. 01/07/23   Claiborne Rigg, NP  valsartan (DIOVAN) 40 MG tablet Take 1 tablet (40 mg total) by mouth daily. 11/14/22   Storm Frisk, MD      Allergies    Trulicity [dulaglutide]    Review of Systems   Review of Systems  Physical Exam Updated Vital Signs BP (!) 160/60   Pulse 87   Temp 98.8 F (37.1 C) (Oral)   Resp 17   SpO2 100%  Physical Exam Vitals and nursing note reviewed.  Constitutional:      General: He is not in acute distress.    Appearance: Normal appearance. He is obese.  HENT:     Head: Normocephalic and atraumatic.     Nose: Nose normal.     Mouth/Throat:     Mouth: Mucous membranes are moist.     Pharynx: Oropharynx is clear.  Eyes:     Extraocular Movements: Extraocular movements intact.     Conjunctiva/sclera: Conjunctivae normal.  Cardiovascular:     Rate and Rhythm: Normal rate and regular rhythm.     Heart sounds: Normal heart sounds.  Pulmonary:     Effort: Pulmonary effort is normal.     Breath sounds: Normal breath sounds.  Abdominal:     General: Abdomen is flat.     Palpations: Abdomen is soft.  Musculoskeletal:        General: Normal range of motion.     Cervical back: Normal range of motion.  Skin:    General: Skin is warm and dry.  Neurological:     General: No focal deficit present.     Mental Status: He is alert and oriented to person, place, and time.  Psychiatric:        Mood and Affect: Mood normal.        Behavior: Behavior normal.     ED Results / Procedures / Treatments    Labs (all labs ordered are listed, but only abnormal results are displayed) Labs Reviewed  CBC - Abnormal; Notable for the following components:      Result Value   Hemoglobin 11.3 (*)    HCT 34.7 (*)    MCV 72.1 (*)    MCH 23.5 (*)    RDW 17.5 (*)    Platelets 407 (*)    All other components within normal limits  COMPREHENSIVE METABOLIC PANEL - Abnormal;  Notable for the following components:   Glucose, Bld 137 (*)    Creatinine, Ser 1.29 (*)    Calcium 8.8 (*)    Total Protein 6.3 (*)    Albumin 2.9 (*)    All other components within normal limits  RPR    EKG None  Radiology [place] EKG SITE RITE Result Date: 04/10/2023 If Site Rite image not attached, placement could not be confirmed due to current cardiac rhythm.   Procedures Procedures    Medications Ordered in ED Medications - No data to display  ED Course/ Medical Decision Making/ A&P Clinical Course as of 04/10/23 1546  Thu Apr 10, 2023  1315 RPR positive 1:64 titer; HIV non-reactive [VK]  1442 I spoke with Dr. Drue Second with ID who recommended lumbar puncture and starting on penicillin recommended admission. They will follow in consult. [VK]  1513 ID spoke with the patient. He does not want LP and they are ok with for-going LP and treating as neurosyphilis as an outpatient. PICC line placement appt scheduled for tomorrow and ID will coordinate follow up and initiating antibiotic treatment. He is stable for discharge home and was given strict return precautions. [VK]    Clinical Course User Index [VK] Rexford Maus, DO                                 Medical Decision Making This patient presents to the ED with chief complaint(s) of abnormal labs with pertinent past medical history of HTN, DM, uveitis which further complicates the presenting complaint. The complaint involves an extensive differential diagnosis and also carries with it a high risk of complications and morbidity.    The differential diagnosis  includes neurosyphilis, no meningeal signs on exam, no signs of sepsis, other inflammatory etiology of uveitis   Additional history obtained: Additional history obtained from N/A Records reviewed Care Everywhere/External Records  ED Course and Reassessment: On patient's arrival he is hemodynamically stable in no acute distress.  Was initially evaluated in triage and had labs performed that appeared to be at his baseline as well as repeat RPR.  He did have HIV testing earlier this month that was nonreactive.  Will plan to discuss with infectious disease for recommendations on further workup and treatment management.  Independent labs interpretation:  The following labs were independently interpreted: at baseline  Independent visualization of imaging: - N/A  Consultation: - Consulted or discussed management/test interpretation w/ external professional: ID  Consideration for admission or further workup: Patient has no emergent conditions requiring admission or further work-up at this time and is stable for discharge home with ID and IR follow-up  Social Determinants of health: N/A    Amount and/or Complexity of Data Reviewed Labs: ordered.          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

## 2023-04-10 NOTE — Discharge Instructions (Addendum)
04/11/2023 3:00 PM (Arrive by 2:30 PM) [facility] 1 [facility] INTERVENTIONAL RADIOLOGY   You were seen in the emergency department for your concern for neurosyphilis.  You were evaluated by infectious disease who will help initiate your treatment as an outpatient.  They have scheduled you an appointment tomorrow at interventional radiology at 2:30 PM to have your PICC line placed and they will help initiate your antibiotic treatment and any additional blood work you might need.  You should follow-up with infectious disease as scheduled for further management.  You should return to the emergency department if you have any new or concerning symptoms.

## 2023-04-10 NOTE — Telephone Encounter (Signed)
Per Durwin Nora, NP patient scheduled for picc placement with [facility] 2/21 at 3.  Pt is not able to take off work. If not able to setup [facility] for picc dressing/labs due to this can arrange pt to come to office. Opat order forwarded to [facility] team through community message. Discharge antibiotics to be given via PICC line  24 million units IV Penicillin continuous infusion  Duration: 14 days  End Date: ending PICC placement but anticipate 2/21 start date  [facility] Care Per Protocol with Biopatch Use: Home health RN for IV administration and teaching, line care and labs.   Labs weekly while on IV antibiotics: _x_ CBC with differential __ BMP **TWICE WEEKLY ON VANCOMYCIN  _x_ CMP _x_ CRP __ ESR __ Vancomycin trough TWICE WEEKLY __ CK   _x_ Please pull PIC at completion of IV antibiotics __ Please leave PIC in place until doctor has seen patient or been notified   Fax weekly labs to [phone]   Juanita Laster, RMA

## 2023-04-10 NOTE — ED Triage Notes (Signed)
Pt referred here by ophthalmologist for neurosyphilis work-up. Per Dr. Drue Second at [facility] pt needs LP and additional work-up. Pt has blurry vision in right eye.

## 2023-04-10 NOTE — Telephone Encounter (Unsigned)
TOC was never notified of consult for Home IV antibiotics prior to discharge.  Reviewed patient's chart and not

## 2023-04-11 ENCOUNTER — Emergency Department (HOSPITAL_COMMUNITY): Admit: 2023-04-11 | Payer: BC Managed Care – PPO

## 2023-04-11 ENCOUNTER — Ambulatory Visit (HOSPITAL_COMMUNITY)
Admission: RE | Admit: 2023-04-11 | Discharge: 2023-04-11 | Disposition: A | Discharge status: Home / Self Care | Payer: BC Managed Care – PPO | Source: Ambulatory Visit | Attending: Infectious Diseases | Admitting: Infectious Diseases

## 2023-04-11 ENCOUNTER — Other Ambulatory Visit (HOSPITAL_COMMUNITY): Payer: Self-pay | Admitting: Infectious Diseases

## 2023-04-11 DIAGNOSIS — A5143 Secondary syphilitic oculopathy: Secondary | ICD-10-CM | POA: Diagnosis not present

## 2023-04-11 DIAGNOSIS — A523 Neurosyphilis, unspecified: Secondary | ICD-10-CM | POA: Diagnosis not present

## 2023-04-11 LAB — RPR
RPR Ser Ql: REACTIVE — AB
RPR Titer: 1:32 {titer}

## 2023-04-11 MED ORDER — HEPARIN SOD (PORK) LOCK FLUSH 100 UNIT/ML IV SOLN
INTRAVENOUS | Status: AC
  Filled 2023-04-11: qty 5

## 2023-04-11 MED ORDER — LIDOCAINE HCL 1 % IJ SOLN
INTRAMUSCULAR | Status: AC
  Filled 2023-04-11: qty 20

## 2023-04-12 DIAGNOSIS — A5143 Secondary syphilitic oculopathy: Secondary | ICD-10-CM | POA: Diagnosis not present

## 2023-04-13 DIAGNOSIS — A5143 Secondary syphilitic oculopathy: Secondary | ICD-10-CM | POA: Diagnosis not present

## 2023-04-14 ENCOUNTER — Other Ambulatory Visit: Payer: Self-pay

## 2023-04-14 ENCOUNTER — Ambulatory Visit: Payer: BC Managed Care – PPO | Admitting: Nurse Practitioner

## 2023-04-14 DIAGNOSIS — A5143 Secondary syphilitic oculopathy: Secondary | ICD-10-CM | POA: Diagnosis not present

## 2023-04-14 LAB — T.PALLIDUM AB, TOTAL: T Pallidum Abs: REACTIVE — AB

## 2023-04-15 ENCOUNTER — Other Ambulatory Visit: Payer: Self-pay

## 2023-04-15 DIAGNOSIS — A5143 Secondary syphilitic oculopathy: Secondary | ICD-10-CM | POA: Diagnosis not present

## 2023-04-16 ENCOUNTER — Telehealth: Payer: Self-pay

## 2023-04-16 ENCOUNTER — Other Ambulatory Visit: Payer: Self-pay

## 2023-04-16 DIAGNOSIS — E1169 Type 2 diabetes mellitus with other specified complication: Secondary | ICD-10-CM

## 2023-04-16 DIAGNOSIS — I10 Essential (primary) hypertension: Secondary | ICD-10-CM

## 2023-04-16 DIAGNOSIS — A5143 Secondary syphilitic oculopathy: Secondary | ICD-10-CM | POA: Diagnosis not present

## 2023-04-17 ENCOUNTER — Telehealth: Payer: Self-pay | Admitting: *Deleted

## 2023-04-17 ENCOUNTER — Ambulatory Visit: Payer: Self-pay | Admitting: Licensed Clinical Social Worker

## 2023-04-17 DIAGNOSIS — A5143 Secondary syphilitic oculopathy: Secondary | ICD-10-CM | POA: Diagnosis not present

## 2023-04-17 NOTE — Progress Notes (Signed)
 Complex Care Management Note  Care Guide Note 04/17/2023 Name: Bruce Cabrera MRN: [id number] DOB: 1965/12/21  Bruce Cabrera is a 58 y.o. year old male who sees Claiborne Rigg, NP for primary care. I reached out to [name] by phone today to offer complex care management services.  Bruce Cabrera was given information about Complex Care Management services today including:   The Complex Care Management services include support from the care team which includes your Nurse Care Manager, Clinical Social Worker, or Pharmacist.  The Complex Care Management team is here to help remove barriers to the health concerns and goals most important to you. Complex Care Management services are voluntary, and the patient may decline or stop services at any time by request to their care team member.   Complex Care Management Consent Status: Patient agreed to services and verbal consent obtained.   Follow up plan:  Telephone appointment with complex care management team member scheduled for:  2/27  Encounter Outcome:  Patient Scheduled  Gwenevere Ghazi  [facility]  [place], [facility] Guide  Direct Dial: [phone]  Fax [phone]

## 2023-04-18 ENCOUNTER — Encounter: Payer: Self-pay | Admitting: Oncology

## 2023-04-18 ENCOUNTER — Other Ambulatory Visit: Payer: Self-pay

## 2023-04-18 DIAGNOSIS — A5143 Secondary syphilitic oculopathy: Secondary | ICD-10-CM | POA: Diagnosis not present

## 2023-04-18 NOTE — Patient Instructions (Signed)
 Visit Information  Thank you for taking time to visit with me today. Please don't hesitate to contact me if I can be of assistance to you.   Following are the goals we discussed today:   Goals Addressed             This Visit's Progress    Care Coordination       Care Coordination Interventions: Assessed Social Determinants of Health Reviewed all upcoming appointments in Epic system Solution-Focused Strategies employed:  Active listening / Reflection utilized  Emotional Support Provided Review housing and food resources sent in mail          Our next appointment is by telephone on 05/08/2023.  Please call the care guide team at [phone] if you need to cancel or reschedule your appointment.   If you are experiencing a Mental Health or Behavioral Health Crisis or need someone to talk to, please call the Suicide and Crisis Lifeline: 988  Patient verbalizes understanding of instructions and care plan provided today and agrees to view in MyChart. Active MyChart status and patient understanding of how to access instructions and care plan via MyChart confirmed with patient.     Telephone follow up appointment with care management team member scheduled for: 05/08/2023

## 2023-04-18 NOTE — Patient Outreach (Signed)
 Care Coordination   Initial Visit Note   04/18/2023 Name: AUSAR GEORGIOU MRN: [id number] DOB: 22-Sep-1965  QAIS JOWERS is a 58 y.o. year old male who sees Claiborne Rigg, NP for primary care. I spoke with  [name] by phone today.  What matters to the patients health and wellness today?  Improving access to community resources.    Goals Addressed             This Visit's Progress    Care Coordination       Care Coordination Interventions: Assessed Social Determinants of Health Reviewed all upcoming appointments in Epic system Solution-Focused Strategies employed:  Active listening / Reflection utilized  Emotional Support Provided Review housing and food resources sent in mail          SDOH assessments and interventions completed:  Yes  SDOH Interventions Today    Flowsheet Row Most Recent Value  SDOH Interventions   Food Insecurity Interventions Community Resources Provided  Housing Interventions Intervention Not Indicated  Transportation Interventions Intervention Not Indicated  Utilities Interventions Intervention Not Indicated        Care Coordination Interventions:  Yes, provided   Interventions Today    Flowsheet Row Most Recent Value  Chronic Disease   Chronic disease during today's visit Other  [SDOH]        Follow up plan: Follow up call scheduled for 05/08/2023    Encounter Outcome:  Patient Visit Completed   Kenton Kingfisher, LCSW [place], [facility] Licensed Clinical Social Worker Care Coordinator [phone]

## 2023-04-19 DIAGNOSIS — A5143 Secondary syphilitic oculopathy: Secondary | ICD-10-CM | POA: Diagnosis not present

## 2023-04-21 ENCOUNTER — Other Ambulatory Visit: Payer: Self-pay | Admitting: Critical Care Medicine

## 2023-04-21 ENCOUNTER — Other Ambulatory Visit: Payer: Self-pay

## 2023-04-21 ENCOUNTER — Encounter: Payer: Self-pay | Admitting: Oncology

## 2023-04-21 DIAGNOSIS — A5143 Secondary syphilitic oculopathy: Secondary | ICD-10-CM | POA: Diagnosis not present

## 2023-04-21 DIAGNOSIS — E1165 Type 2 diabetes mellitus with hyperglycemia: Secondary | ICD-10-CM

## 2023-04-21 MED ORDER — DAPAGLIFLOZIN PROPANEDIOL 10 MG PO TABS
10.0000 mg | ORAL_TABLET | Freq: Every day | ORAL | 4 refills | Status: DC
  Filled 2023-04-21: qty 30, 30d supply, fill #0
  Filled 2023-05-24: qty 30, 30d supply, fill #1
  Filled 2023-06-24: qty 30, 30d supply, fill #2
  Filled 2023-07-22: qty 30, 30d supply, fill #3
  Filled 2023-08-18: qty 30, 30d supply, fill #4

## 2023-04-22 ENCOUNTER — Other Ambulatory Visit: Payer: Self-pay

## 2023-04-22 DIAGNOSIS — E119 Type 2 diabetes mellitus without complications: Secondary | ICD-10-CM | POA: Diagnosis not present

## 2023-04-22 DIAGNOSIS — H30033 Focal chorioretinal inflammation, peripheral, bilateral: Secondary | ICD-10-CM | POA: Diagnosis not present

## 2023-04-22 DIAGNOSIS — A523 Neurosyphilis, unspecified: Secondary | ICD-10-CM | POA: Diagnosis not present

## 2023-04-22 DIAGNOSIS — H3581 Retinal edema: Secondary | ICD-10-CM | POA: Diagnosis not present

## 2023-04-22 DIAGNOSIS — A5143 Secondary syphilitic oculopathy: Secondary | ICD-10-CM | POA: Diagnosis not present

## 2023-04-22 MED ORDER — DORZOLAMIDE HCL-TIMOLOL MAL 2-0.5 % OP SOLN
1.0000 [drp] | Freq: Two times a day (BID) | OPHTHALMIC | 11 refills | Status: AC
Start: 2023-04-22 — End: ?
  Filled 2023-04-22 – 2023-05-12 (×4): qty 10, 100d supply, fill #0
  Filled 2024-01-14 – 2024-01-23 (×2): qty 10, 100d supply, fill #1

## 2023-04-22 MED ORDER — AZATHIOPRINE 50 MG PO TABS
50.0000 mg | ORAL_TABLET | Freq: Every day | ORAL | 3 refills | Status: DC
Start: 2023-04-22 — End: 2023-07-29

## 2023-04-23 ENCOUNTER — Institutional Professional Consult (permissible substitution): Payer: BC Managed Care – PPO | Admitting: Internal Medicine

## 2023-04-23 DIAGNOSIS — A5143 Secondary syphilitic oculopathy: Secondary | ICD-10-CM | POA: Diagnosis not present

## 2023-04-24 ENCOUNTER — Other Ambulatory Visit: Payer: Self-pay

## 2023-04-24 ENCOUNTER — Encounter: Payer: Self-pay | Admitting: Infectious Diseases

## 2023-04-24 ENCOUNTER — Ambulatory Visit: Payer: BC Managed Care – PPO | Admitting: Infectious Diseases

## 2023-04-24 VITALS — BP 150/77 | HR 99 | Resp 16 | Ht 75.0 in | Wt 262.3 lb

## 2023-04-24 DIAGNOSIS — A5143 Secondary syphilitic oculopathy: Secondary | ICD-10-CM | POA: Diagnosis not present

## 2023-04-24 LAB — LAB REPORT - SCANNED: EGFR: 77

## 2023-04-24 MED ORDER — PENICILLIN G BENZATHINE 1200000 UNIT/2ML IM SUSY
1.2000 10*6.[IU] | PREFILLED_SYRINGE | Freq: Once | INTRAMUSCULAR | Status: AC
Start: 2023-04-24 — End: 2023-04-24
  Administered 2023-04-24: 1.2 10*6.[IU] via INTRAMUSCULAR

## 2023-04-24 MED ORDER — DOXYCYCLINE HYCLATE 100 MG PO TABS
200.0000 mg | ORAL_TABLET | ORAL | 0 refills | Status: DC
  Filled 2023-04-24: qty 20, 10d supply, fill #0

## 2023-04-24 NOTE — Patient Instructions (Addendum)
 What is DOXY-PEP? When you take preventative antibiotics to prevent sexually transmitted infections (STIs). This has been shown to reduce syphilis and chlamydia re-infections by up to 70% in some studies.   How to take your antibiotic? Take TWO tablets (200 mg) of Doxycycline ONCE with food and a full glass of water within 24 hours (no later than 72 hours) after condomless sex (oral or rectal).   DOXYCYCLINE Information when you take it:  Take with food --> otherwise you will likely experience some bad nausea +/- vomiting, abdominal pains Make sure you drink a full 8 oz glass of water with each dose Sit upright for 1 hour after to prevent heartburn  Common Questions:  Does oral sex count as an at risk exposure --> YES. Whether receiving or performing, you can take Doxy-PEP to help prevent this type of transmission  If I have have more than one sexual partner per day do I need a dose every time? --> NO. Only one dose in a 24 hour period of time is sufficient.  If I have several episodes of sex consecutive days in a row can I take a dose a day? --> YES. You can take this preventative dose once a day if needed.  Should I share my medication with other partners --> NO. This has not been proven to be helpful for everyone and it's best to keep your medicine yours. Plus if they have an STD already, it can lead to increased resistance to antibiotics.    Please come back to see our Pharmacy team in 1 week to discuss options for HIV prevention as well.

## 2023-04-24 NOTE — Progress Notes (Signed)
 Patient: Bruce Cabrera  DOB: 07/17/1965 MRN: [id number] PCP: Claiborne Rigg, NP  Referring Provider: ER follow up   Reason for Visit: ocular syphilis    Chief Complaint  Patient presents with   Follow-up      Subjective   Subjective:    Discussed the use of AI scribe software for clinical note transcription with the patient, who gave verbal consent to proceed.  History of Present Illness   Bruce Cabrera is a 58 year old male here for follow up on ocular syphilis infection.   He is due to complete a two-week course of medication for ocular syphilis and is currently on the last day of treatment. He notes significant improvement in symptoms, particularly with his eyes, which are no longer red and his vision is clear. No new infectious concerns. His his blood counts, kidney, and liver functions are stable on the current medication. He has done well with PICC line and no concerns related to the line care. His home health team has been helpful and planning to meet him in the AM after he gets off work to discontinue his picc line.   We discussed sexual history - he is interested in learning more about PrEP for HIV prevention as well as how to prevent syphilis again in the future.   He has been experiencing emotional distress, feeling overwhelmed and depressed due to the recent loss of his sister in September and the ongoing health issues. He works night shifts and has been up since the previous night, contributing to his fatigue. He mentions a routine of going to work, coming home, and going to bed, with little social interaction. He is considering getting a dog for an emotional companion and asking about the process to help make that happen.      Follow up with Dr. Sherryll Burger earlier this week 3/4 -  Diagnosed with granulomatous uveitis of both eyes per Dr. Dione Booze - referral to Dr. 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 We saw him in ER last week after the HD recommended to go there for urgent evaluation.     Review of Systems  Eyes:  Negative for blurred vision, double vision, photophobia, pain, discharge and redness.  Neurological: Negative.   PICC line is without pain, drainage or erythema and is well maintained by [facility] Team. No swelling or altered sensation in affected distal extremity.    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 90 tablet 2   atorvastatin (LIPITOR) 80 MG tablet Take 1 tablet (80 mg total) by mouth daily. 90 tablet 3   azaTHIOprine (IMURAN) 50 MG tablet Take 1 tablet (50 mg total) by mouth daily. 90 tablet 3   Blood Glucose Monitoring Suppl (ONETOUCH VERIO) w/Device KIT Use to  check blood sugar three times daily. 1 kit 0   buPROPion (WELLBUTRIN XL) 150 MG 24 hr tablet Take 1 tablet (150 mg total) by mouth daily. 60 tablet 2   Continuous Glucose Receiver (DEXCOM G7 RECEIVER) DEVI Use to check blood glucose continuously. 1 each 0   Continuous Glucose Sensor (DEXCOM G7 SENSOR) MISC Use to check blood glucose throughout the day. Changes sensors once every 10 days. 3 each 6   Continuous Glucose Sensor (FREESTYLE LIBRE 3 SENSOR) MISC Place 1 sensor on the skin every 14 days. Use to check glucose continuously 2 each 6   dapagliflozin propanediol (FARXIGA) 10 MG TABS tablet  Take 1 tablet (10 mg total) by mouth daily before breakfast. 30 tablet 4   dorzolamide-timolol (COSOPT) 2-0.5 % ophthalmic solution Place 1 drop into both eyes 2 (two) times daily. 10 mL 11   dorzolamide-timolol (COSOPT) 2-0.5 % ophthalmic solution Place 1 drop into both eyes 2 (two) times daily. 10 mL 11   glucose blood (ONETOUCH VERIO) test strip Use to check blood sugar three times daily. 100 each 6   insulin glargine, 1 Unit Dial, (TOUJEO) 300 UNIT/ML Solostar Pen Inject 32 Units into the skin at bedtime. 4.5 mL 4   Insulin Pen Needle (TRUEPLUS 5-BEVEL PEN NEEDLES) 32G X 4 MM MISC Use to inject Toujeo once daily. 100 each 2   Lancets (ONETOUCH DELICA PLUS LANCET33G) MISC Use to check blood sugar three times daily. 100 each 6   Lancets Misc. (ACCU-CHEK SOFTCLIX LANCET DEV) KIT use as directed 1 kit 0   oxyCODONE-acetaminophen (PERCOCET) 5-325 MG tablet Take 1 tablet by mouth every 8 (eight) hours as needed for severe pain (pain score 7-10). 30 tablet 0   penicillin G IVPB Inject into the vein.     prednisoLONE acetate (PRED FORTE) 1 % ophthalmic suspension Instill 1 drop into both eyes four times a day 10 mL 0   predniSONE (DELTASONE) 10 MG tablet TAKE 4 tablets (40 mg total) BY MOUTH daily for 7 days, THEN 3 tablets (30 mg total) daily for 7 days, THEN 2 tablets (20 mg total) daily for 7 days, THEN 1 tablet (10 mg total) daily for 7 days. 70 tablet 0   Semaglutide, 1 MG/DOSE, 4 MG/3ML SOPN Inject 1 mg as directed once a week. 3 mL 3   valsartan (DIOVAN) 40 MG tablet Take 1 tablet (40 mg total) by mouth daily. 90 tablet 3   azaTHIOprine (IMURAN) 50 MG tablet Take 1 tablet (50 mg total) by mouth daily. (Patient not taking: Reported on 04/24/2023) 90 tablet 3   erythromycin ophthalmic ointment Place a 1/4 inch ribbon of ointment into the lower eyelid twice daily for  5-7 days. (Patient not taking: Reported on 02/28/2023) 3.5 g 0   No facility-administered medications prior to visit.     Allergies   Allergen Reactions   Trulicity [Dulaglutide] Other (See Comments)    Abdominal pain    Social History   Tobacco Use   Smoking status: Some Days    Current packs/day: 0.50    Average packs/day: 0.5 packs/day for 30.0 years (15.0 ttl pk-yrs)    Types: Cigarettes   Smokeless tobacco: Never  Vaping Use   Vaping status: Never Used  Substance Use Topics   Alcohol use: Yes    Comment: 2- tallboys a day   Drug use: No       Objective   Objective:   Vitals:   04/24/23 1253 04/24/23 1255  BP: Marland Kitchen)  150/77 (!) 150/77  Pulse: 99   Resp: 16   Weight: 262 lb 4.8 oz (119 kg)   Height: 6\' 3"  (1.905 m)    Body mass index is 32.79 kg/m.  Physical Exam Eyes:     General: Lids are normal. Vision grossly intact. No visual field deficit.    Conjunctiva/sclera: Conjunctivae normal.     Pupils: Pupils are equal, round, and reactive to light.  Musculoskeletal:     Cervical back: Normal range of motion. No rigidity.   LUE PICC line - clean/dry dressing. Insertion site w/o erythema, tenderness, drainage, cording or distal swelling of affected extremity       Assessment & Plan:     Syphilis, Ocular Involvement -  [facility] -  Bilateral Uveitis -  Improvement in ocular symptoms following two weeks of treatment. Blood work shows no side effects to his penicillin infusion. Syphilis titers will be more informative in a few months - discussed that these will need to be collected again in 3-6 months and can be done so at PrEP follow ups as well. Likely had primary syphilis lesion in December / late 2024 however to be conservative, will plan one final round of bicillin today to cover for possible late infection.  -Administer final round of bicillin today -Repeat blood work in three months to monitor syphilis titers.  -Discussed pros/cons of doxycycline PEP - he would like to have this on hand at his availability for future use.   Reactive  Depression / Grief -  Patient reports social isolation and depressive symptoms following recent life stressors. Expressed interest in obtaining an emotional support animal. -Discuss emotional support animal paperwork with primary care provider or during next visit after he discusses with landlord if there is any specific paperwork needed. I told him I can fill it out since he will be a recurring patient here.   HIV Prevention - Patient has had male partners and is at risk for HIV. HIV tests were negative within the last month.  -Schedule appointment in one week to discuss pre-exposure prophylaxis (PrEP) options, including daily oral medication or bi-monthly injections.       No orders of the defined types were placed in this encounter.   Meds ordered this encounter  Medications   doxycycline (VIBRA-TABS) 100 MG tablet    Sig: Take 2 tablets (200 mg total) by mouth See admin instructions. Within 72 hours of sexual contact    Dispense:  20 tablet    Refill:  0    Return in about 1 week (around 05/01/2023).   Rexene Alberts, MSN, NP-C [facility]  [email or web address] Pager: [phone] Office: [phone] [facility] Main Line: [phone] *Secure Chat Communication Welcome

## 2023-04-24 NOTE — Addendum Note (Signed)
 Addended by: Clayborne Artist A on: 04/24/2023 02:12 PM   Modules accepted: Orders

## 2023-04-25 ENCOUNTER — Other Ambulatory Visit: Payer: Self-pay

## 2023-04-25 DIAGNOSIS — A5143 Secondary syphilitic oculopathy: Secondary | ICD-10-CM | POA: Diagnosis not present

## 2023-04-26 NOTE — Progress Notes (Unsigned)
 NEW REFERRAL TO [facility]    Date:  04/26/2023   HPI: Bruce Cabrera is a 58 y.o. male who presents to the [facility] pharmacy clinic to discuss and initiate PrEP.  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   ATORVASTATIN (LIPITOR) 80 MG TABLET    Take 1 tablet (80 mg total) by mouth daily.   AZATHIOPRINE (IMURAN) 50 MG TABLET    Take 1 tablet (50 mg total) by mouth daily.   AZATHIOPRINE (IMURAN) 50 MG TABLET    Take 1 tablet (50 mg total) by mouth daily.   BLOOD GLUCOSE MONITORING SUPPL (ONETOUCH VERIO) W/DEVICE KIT    Use to check blood sugar three times daily.   BUPROPION (WELLBUTRIN XL) 150 MG 24 HR TABLET    Take 1 tablet (150 mg total) by mouth daily.   CONTINUOUS GLUCOSE RECEIVER (DEXCOM G7 RECEIVER) DEVI    Use to check blood glucose continuously.   CONTINUOUS GLUCOSE SENSOR (DEXCOM G7 SENSOR) MISC    Use to check blood glucose throughout the day. Changes sensors once every 10 days.   CONTINUOUS GLUCOSE SENSOR (FREESTYLE LIBRE 3 SENSOR) MISC    Place 1 sensor on the skin every 14 days. Use to check glucose continuously   DAPAGLIFLOZIN PROPANEDIOL (FARXIGA) 10 MG TABS TABLET    Take 1 tablet (10 mg total) by mouth daily before breakfast.   DORZOLAMIDE-TIMOLOL (COSOPT) 2-0.5 % OPHTHALMIC SOLUTION    Place 1 drop into both eyes 2 (two) times daily.    DORZOLAMIDE-TIMOLOL (COSOPT) 2-0.5 % OPHTHALMIC SOLUTION    Place 1 drop into both eyes 2 (two) times daily.   DOXYCYCLINE (VIBRA-TABS) 100 MG TABLET    Take 2 tablets (200 mg total) by mouth See admin instructions. Within 72 hours of sexual contact   ERYTHROMYCIN OPHTHALMIC OINTMENT    Place a 1/4 inch ribbon of ointment into the lower eyelid twice daily for  5-7 days.   GLUCOSE BLOOD (ONETOUCH VERIO) TEST STRIP    Use to check blood sugar three times daily.   INSULIN GLARGINE, 1 UNIT DIAL, (TOUJEO) 300 UNIT/ML SOLOSTAR PEN    Inject 32 Units into the skin at bedtime.   INSULIN PEN NEEDLE (TRUEPLUS 5-BEVEL PEN NEEDLES) 32G X 4 MM MISC    Use to inject Toujeo once daily.   LANCETS (ONETOUCH DELICA PLUS LANCET33G) MISC    Use to check blood sugar three times daily.   LANCETS MISC. (ACCU-CHEK SOFTCLIX LANCET DEV) KIT    use as directed   OXYCODONE-ACETAMINOPHEN (PERCOCET) 5-325 MG TABLET    Take 1 tablet by mouth every 8 (eight) hours as needed for severe pain (pain score 7-10).   PENICILLIN G IVPB    Inject into the vein.   PREDNISOLONE ACETATE (PRED FORTE) 1 % OPHTHALMIC  SUSPENSION    Instill 1 drop into both eyes four times a day   PREDNISONE (DELTASONE) 10 MG TABLET    TAKE 4 tablets (40 mg total) BY MOUTH daily for 7 days, THEN 3 tablets (30 mg total) daily for 7 days, THEN 2 tablets (20 mg total) daily for 7 days, THEN 1 tablet (10 mg total) daily for 7 days.   SEMAGLUTIDE, 1 MG/DOSE, 4 MG/3ML SOPN    Inject 1 mg as directed once a week.   VALSARTAN (DIOVAN) 40 MG TABLET    Take 1 tablet (40 mg total) by mouth daily.  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) 05/09/2022    Social History: Social History   Socioeconomic History   Marital status: Single    Spouse name: Not on file   Number of children: Not on file   Years of education: Not on file   Highest education level: Not on file  Occupational History   Occupation: Science writer at [employer]  Tobacco Use   Smoking status: Some Days    Current packs/day: 0.50    Average packs/day: 0.5 packs/day for 30.0 years (15.0 ttl pk-yrs)    Types: Cigarettes   Smokeless tobacco: Never  Vaping Use   Vaping status: Never Used  Substance and Sexual Activity   Alcohol use: Yes    Comment: 2- tallboys a day   Drug use: No   Sexual activity: Not on file  Other Topics Concern   Not on file  Social History Narrative   Lives with roommate   Social Drivers of Health   Financial Resource Strain: High Risk (01/07/2023)   Overall Financial Resource Strain (CARDIA)    Difficulty of Paying Living Expenses: Very hard  Food Insecurity: Food Insecurity Present (04/17/2023)   Hunger Vital Sign    Worried About Running Out of Food in the Last Year: Sometimes true    Ran Out of Food in the Last Year: Never true  Transportation Needs: No Transportation Needs (04/17/2023)   PRAPARE - Administrator, Civil Service (Medical): No    Lack of Transportation (Non-Medical): No  Physical Activity: Inactive (01/07/2023)   Exercise Vital Sign    Days of Exercise per Week: 0 days    Minutes of Exercise per Session: 0 min  Stress: Stress Concern Present (01/07/2023)   [employer] of Occupational Health - Occupational Stress Questionnaire    Feeling of Stress : Very much  Social Connections:  Socially Isolated (01/07/2023)   Social Connection and Isolation Panel [NHANES]    Frequency of Communication with Friends and Family: Once a week    Frequency of Social Gatherings with Friends and Family: Once a week    Attends Religious Services: Never    Database administrator or Organizations: No    Attends  Club or Organization Meetings: Never    Marital Status: Never married        No data to display          Labs:  SCr: Lab Results  Component Value Date   CREATININE 1.29 (H) 04/10/2023   CREATININE 1.48 (H) 01/13/2023   CREATININE 1.33 (H) 12/19/2022   CREATININE 1.36 (H) 05/09/2022   CREATININE 1.20 12/13/2021   HIV Lab Results  Component Value Date   HIV Non Reactive 10/16/2021   HIV NON REAC 01/11/2008   Hepatitis B No results found for: "HEPBSAB", "HEPBSAG", "HEPBCAB" Hepatitis C No results found for: "HEPCAB", "HCVRNAPCRQN" Hepatitis A No results found for: "HAV" RPR and STI Lab Results  Component Value Date   LABRPR Reactive (A) 04/10/2023   LABRPR NON REAC 01/11/2008   LABRPR NON REAC 01/27/2007        No data to display          Assessment: Kaheem presents to clinic today to initiate PrEP. He recently followed with Judeth Cornfield as she managed his ocular syphilis treatment. He successfully completed penicillin G infusion outpatient for 2 weeks and states his ocular symptoms have significantly improved. He already reviewed doxyPEP with Judeth Cornfield. States he has not been sexually active since his visit with her last week and has not needed to use it yet. Reviewed to take it with food and to sit up for at least an hour after taking it.   He is active with male and male partners and rarely uses condoms. States he has been active with 2 partners in the last 6 months and participates in insertive, receptive, and oral sex. Discussed his PrEP options as he is an excellent candidate to start. He would like to start Apretude which is covered by his insurance  at no charge. Prescription sent to [facility]. Will check all baseline labs today including HIV antibody along with hepatitis serologies.   Politely declines STI testing today given no recent sexual activity. Will defer RPR until 3-6 months for assessment after completing treatment last month.  Eligible for flu, COVID, and Shingrix vaccines which he politely declines today.  Plan: - Check HIV antibody and hepatitis A/B/C serologies - Start Apretude if HIV antibody negative - Follow up with me  on 3/19 for first Apretude injection and PrEP follow-up    Margarite Gouge, PharmD, CPP, BCIDP, AAHIVP Clinical Pharmacist Practitioner Infectious Diseases Clinical Pharmacist [facility]

## 2023-04-28 ENCOUNTER — Encounter: Payer: Self-pay | Admitting: Oncology

## 2023-04-28 ENCOUNTER — Encounter: Payer: Self-pay | Admitting: Nurse Practitioner

## 2023-04-28 ENCOUNTER — Other Ambulatory Visit: Payer: Self-pay

## 2023-04-28 ENCOUNTER — Ambulatory Visit: Payer: BC Managed Care – PPO | Attending: Nurse Practitioner | Admitting: Nurse Practitioner

## 2023-04-28 VITALS — BP 127/78 | HR 78 | Resp 19 | Ht 75.0 in | Wt 259.8 lb

## 2023-04-28 DIAGNOSIS — D649 Anemia, unspecified: Secondary | ICD-10-CM | POA: Diagnosis not present

## 2023-04-28 DIAGNOSIS — Z794 Long term (current) use of insulin: Secondary | ICD-10-CM | POA: Diagnosis not present

## 2023-04-28 DIAGNOSIS — I10 Essential (primary) hypertension: Secondary | ICD-10-CM | POA: Diagnosis not present

## 2023-04-28 DIAGNOSIS — E119 Type 2 diabetes mellitus without complications: Secondary | ICD-10-CM

## 2023-04-28 DIAGNOSIS — R7989 Other specified abnormal findings of blood chemistry: Secondary | ICD-10-CM | POA: Diagnosis not present

## 2023-04-28 DIAGNOSIS — Z7984 Long term (current) use of oral hypoglycemic drugs: Secondary | ICD-10-CM

## 2023-04-28 LAB — POCT GLYCOSYLATED HEMOGLOBIN (HGB A1C): Hemoglobin A1C: 8.1 % — AB (ref 4.0–5.6)

## 2023-04-28 MED ORDER — SEMAGLUTIDE (2 MG/DOSE) 8 MG/3ML ~~LOC~~ SOPN
2.0000 mg | PEN_INJECTOR | SUBCUTANEOUS | 3 refills | Status: DC
Start: 2023-04-28 — End: 2023-05-27
  Filled 2023-04-28: qty 3, 28d supply, fill #0

## 2023-04-28 NOTE — Patient Instructions (Addendum)
 Increase toujeo by 2 units every 3 days for morning fasting blood glucose greater than 130

## 2023-04-28 NOTE — Progress Notes (Signed)
 Assessment & Plan:  Bruce Cabrera was seen today for medical management of chronic issues.  Diagnoses and all orders for this visit:  Diabetes mellitus treated with insulin and oral medication  Dose change: Increase Ozempic to 2 mg weekly and continue Toujeo -     POCT glycosylated hemoglobin (Hb A1C) -     Semaglutide, 2 MG/DOSE, 8 MG/3ML SOPN; Inject 2 mg as directed once a week.  Low serum calcium -     VITAMIN D 25 Hydroxy (Vit-D Deficiency, Fractures)  Low hemoglobin -     CBC with Differential  Primary hypertension Continue all antihypertensives as prescribed.  Reminded to bring in blood pressure log for follow  up appointment.  RECOMMENDATIONS: DASH/Mediterranean Diets are healthier choices for HTN.     Patient has been counseled on age-appropriate routine health concerns for screening and prevention. These are reviewed and up-to-date. Referrals have been placed accordingly. Immunizations are up-to-date or declined.    Subjective:   Chief Complaint  Patient presents with   Medical Management of Chronic Issues    Bruce Cabrera 58 y.o. male presents to office today for follow up to HTN and DM.  He has a past medical history of diabetes, neurosyphilis (ocular), depression, diabetes foot ulcer, hyperlipidemia, hypertension and CKD stage V.   DM2 Diabetes is currently not well-controlled.  He states he cannot afford the freestyle libre due to cost. Dexcom has been ordered for him and he plans to pick up today from the pharmacy.  He has not been monitoring his blood glucose levels at home.  A1c increased to 8.1 from 7.6.  Unfortunately he has been prescribed p.o. prednisone over the past 30 days and this is also likely contributing to his increased glucose levels.  Currently prescribed Ozempic 1 mg weekly and Toujeo 32 units at bedtime. Lab Results  Component Value Date   HGBA1C 8.1 (A) 04/28/2023    Lab Results  Component Value Date   HGBA1C 7.6 (A) 01/07/2023    HTN Blood  pressure is well-controlled today with valsartan 40 mg daily and amlodipine 10 mg daily. BP Readings from Last 3 Encounters:  04/28/23 127/78  04/24/23 (!) 150/77  04/10/23 (!) 164/97     Review of Systems  Constitutional:  Negative for fever, malaise/fatigue and weight loss.  HENT: Negative.  Negative for nosebleeds.   Eyes: Negative.  Negative for blurred vision, double vision and photophobia.  Respiratory: Negative.  Negative for cough and shortness of breath.   Cardiovascular: Negative.  Negative for chest pain, palpitations and leg swelling.  Gastrointestinal: Negative.  Negative for heartburn, nausea and vomiting.  Musculoskeletal: Negative.  Negative for myalgias.  Neurological: Negative.  Negative for dizziness, focal weakness, seizures and headaches.  Psychiatric/Behavioral: Negative.  Negative for suicidal ideas.     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) 05/09/2022    Past Surgical History:  Procedure Laterality Date   COLONOSCOPY     COLONOSCOPY W/ POLYPECTOMY  03/21/2023   FOOT SURGERY Left    LEFT HEART CATHETERIZATION WITH CORONARY ANGIOGRAM N/A 11/29/2013  Procedure: LEFT HEART CATHETERIZATION WITH CORONARY ANGIOGRAM;  Surgeon: Lennette Bihari, MD;  Location: [facility] CATH LAB;  Service: Cardiovascular;  Laterality: N/A;    Family History  Problem Relation Age of Onset   Heart attack Mother 28   Colon polyps Sister    Colon cancer Sister    Stroke Sister    Heart failure Brother 40   Rectal cancer Neg Hx    Stomach cancer Neg Hx    Esophageal cancer Neg Hx      Social History Reviewed with no changes to be made today.   Outpatient Medications Prior to Visit  Medication Sig Dispense Refill   amLODipine (NORVASC) 10 MG tablet Take 1 tablet (10 mg total) by mouth daily. 90 tablet 2   atorvastatin (LIPITOR) 80 MG tablet Take 1 tablet (80 mg total) by mouth daily. 90 tablet 3   azaTHIOprine (IMURAN) 50 MG tablet Take 1 tablet (50 mg total) by mouth daily. 90 tablet 3   azaTHIOprine (IMURAN) 50 MG tablet Take 1 tablet (50 mg total) by mouth daily. 90 tablet 3   Blood Glucose Monitoring Suppl (ONETOUCH VERIO) w/Device KIT Use to check blood sugar three times daily. 1 kit 0   Continuous Glucose Receiver (DEXCOM G7 RECEIVER) DEVI Use to check blood glucose continuously. 1 each 0   Continuous Glucose Sensor (DEXCOM G7 SENSOR) MISC Use to check blood glucose throughout the day. Changes sensors once every 10 days. 3 each 6   dapagliflozin propanediol (FARXIGA) 10 MG TABS tablet Take 1 tablet (10 mg total) by mouth daily before breakfast. 30 tablet 4   dorzolamide-timolol (COSOPT) 2-0.5 % ophthalmic solution Place 1 drop into both eyes 2 (two) times daily. 10 mL 11   dorzolamide-timolol (COSOPT) 2-0.5 % ophthalmic solution Place 1 drop into both eyes 2 (two) times daily. 10 mL 11   erythromycin ophthalmic ointment Place a 1/4 inch ribbon of ointment into the lower eyelid twice daily for  5-7 days. 3.5 g 0   insulin glargine, 1 Unit Dial, (TOUJEO) 300 UNIT/ML Solostar Pen Inject 32 Units into the skin at bedtime. 4.5 mL 4   Insulin Pen Needle (TRUEPLUS 5-BEVEL PEN NEEDLES) 32G X 4 MM MISC Use to inject Toujeo once daily. 100 each 2   prednisoLONE acetate (PRED FORTE) 1 % ophthalmic suspension Instill 1 drop into both eyes four times a day 10 mL 0   predniSONE (DELTASONE) 10 MG tablet TAKE 4 tablets (40 mg total) BY MOUTH daily for 7 days, THEN 3 tablets (30 mg total) daily for 7 days, THEN 2 tablets (20 mg total) daily for 7 days, THEN 1 tablet (10 mg total)  daily for 7 days. 70 tablet 0   valsartan (DIOVAN) 40 MG tablet Take 1 tablet (40 mg total) by mouth daily. 90 tablet 3   Semaglutide, 1 MG/DOSE, 4 MG/3ML SOPN Inject 1 mg as directed once a week. 3 mL 3   doxycycline (VIBRA-TABS) 100 MG tablet Take 2 tablets (200 mg total) by mouth See admin instructions. Within 72 hours of sexual contact 20 tablet 0   buPROPion (WELLBUTRIN XL) 150 MG 24 hr tablet Take 1 tablet (150 mg total) by mouth daily. (Patient not taking: Reported on 04/28/2023) 60 tablet 2   Continuous Glucose Sensor (FREESTYLE LIBRE 3 SENSOR) MISC Place 1 sensor on the skin every 14 days. Use to check glucose continuously (Patient not taking: Reported on 04/28/2023) 2 each 6   glucose blood (ONETOUCH VERIO) test strip Use to  check blood sugar three times daily. (Patient not taking: Reported on 04/28/2023) 100 each 6   Lancets (ONETOUCH DELICA PLUS LANCET33G) MISC Use to check blood sugar three times daily. (Patient not taking: Reported on 04/28/2023) 100 each 6   Lancets Misc. (ACCU-CHEK SOFTCLIX LANCET DEV) KIT use as directed (Patient not taking: Reported on 04/28/2023) 1 kit 0   oxyCODONE-acetaminophen (PERCOCET) 5-325 MG tablet Take 1 tablet by mouth every 8 (eight) hours as needed for severe pain (pain score 7-10). (Patient not taking: Reported on 04/28/2023) 30 tablet 0   penicillin G IVPB Inject into the vein. (Patient not taking: Reported on 04/28/2023)     No facility-administered medications prior to visit.    Allergies  Allergen Reactions   Trulicity [Dulaglutide] Other (See Comments)    Abdominal pain       Objective:    BP 127/78 (BP Location: Right Arm, Patient Position: Sitting, Cuff Size: Large)   Pulse 78   Resp 19   Ht 6\' 3"  (1.905 m)   Wt 259 lb 12.8 oz (117.8 kg)   SpO2 99%   BMI 32.47 kg/m  Wt Readings from Last 3 Encounters:  04/28/23 259 lb 12.8 oz (117.8 kg)  04/24/23 262 lb 4.8 oz (119 kg)  03/21/23 268 lb (121.6 kg)    Physical Exam Vitals and  nursing note reviewed.  Constitutional:      Appearance: He is well-developed.  HENT:     Head: Normocephalic and atraumatic.  Cardiovascular:     Rate and Rhythm: Normal rate and regular rhythm.     Heart sounds: Normal heart sounds. No murmur heard.    No friction rub. No gallop.  Pulmonary:     Effort: Pulmonary effort is normal. No tachypnea or respiratory distress.     Breath sounds: Normal breath sounds. No decreased breath sounds, wheezing, rhonchi or rales.  Chest:     Chest wall: No tenderness.  Abdominal:     General: Bowel sounds are normal.     Palpations: Abdomen is soft.  Musculoskeletal:        General: Normal range of motion.     Cervical back: Normal range of motion.  Skin:    General: Skin is warm and dry.  Neurological:     Mental Status: He is alert and oriented to person, place, and time.     Coordination: Coordination normal.  Psychiatric:        Behavior: Behavior normal. Behavior is cooperative.        Thought Content: Thought content normal.        Judgment: Judgment normal.          Patient has been counseled extensively about nutrition and exercise as well as the importance of adherence with medications and regular follow-up. The patient was given clear instructions to go to ER or return to medical center if symptoms don't improve, worsen or new problems develop. The patient verbalized understanding.   Follow-up: Return in about 3 months (around 07/29/2023).   Claiborne Rigg, FNP-BC [facility] and [facility] [place], [place] [phone]   04/28/2023, 1:12 PM

## 2023-04-29 ENCOUNTER — Other Ambulatory Visit: Payer: Self-pay

## 2023-04-29 LAB — CBC WITH DIFFERENTIAL/PLATELET
Basophils Absolute: 0 10*3/uL (ref 0.0–0.2)
Basos: 0 %
EOS (ABSOLUTE): 0.1 10*3/uL (ref 0.0–0.4)
Eos: 3 %
Hematocrit: 38.7 % (ref 37.5–51.0)
Hemoglobin: 12.4 g/dL — ABNORMAL LOW (ref 13.0–17.7)
Immature Grans (Abs): 0 10*3/uL (ref 0.0–0.1)
Immature Granulocytes: 0 %
Lymphocytes Absolute: 1.6 10*3/uL (ref 0.7–3.1)
Lymphs: 32 %
MCH: 24.2 pg — ABNORMAL LOW (ref 26.6–33.0)
MCHC: 32 g/dL (ref 31.5–35.7)
MCV: 76 fL — ABNORMAL LOW (ref 79–97)
Monocytes Absolute: 0.3 10*3/uL (ref 0.1–0.9)
Monocytes: 6 %
Neutrophils Absolute: 3 10*3/uL (ref 1.4–7.0)
Neutrophils: 59 %
Platelets: 179 10*3/uL (ref 150–450)
RBC: 5.12 x10E6/uL (ref 4.14–5.80)
RDW: 19.5 % — ABNORMAL HIGH (ref 11.6–15.4)
WBC: 5.1 10*3/uL (ref 3.4–10.8)

## 2023-04-29 LAB — VITAMIN D 25 HYDROXY (VIT D DEFICIENCY, FRACTURES): Vit D, 25-Hydroxy: 5.3 ng/mL — ABNORMAL LOW (ref 30.0–100.0)

## 2023-04-30 ENCOUNTER — Other Ambulatory Visit (HOSPITAL_COMMUNITY): Payer: Self-pay

## 2023-04-30 ENCOUNTER — Telehealth: Payer: Self-pay

## 2023-04-30 ENCOUNTER — Encounter: Payer: Self-pay | Admitting: Infectious Diseases

## 2023-04-30 ENCOUNTER — Other Ambulatory Visit: Payer: Self-pay

## 2023-04-30 NOTE — Telephone Encounter (Signed)
 Pharmacy Patient Advocate Encounter  Insurance verification completed.   The patient is insured through  [employer]    Ran test claim for Descovy. Currently a quantity of 30 is a 30 day supply and the co-pay is $0.00 . Apretude $0.00  This test claim was processed through [facility]- copay amounts may vary at other pharmacies due to pharmacy/plan contracts, or as the patient moves through the different stages of their insurance plan.

## 2023-05-01 ENCOUNTER — Other Ambulatory Visit: Payer: Self-pay

## 2023-05-01 ENCOUNTER — Ambulatory Visit (INDEPENDENT_AMBULATORY_CARE_PROVIDER_SITE_OTHER): Admitting: Pharmacist

## 2023-05-01 ENCOUNTER — Other Ambulatory Visit (HOSPITAL_COMMUNITY): Payer: Self-pay

## 2023-05-01 DIAGNOSIS — Z113 Encounter for screening for infections with a predominantly sexual mode of transmission: Secondary | ICD-10-CM

## 2023-05-01 DIAGNOSIS — Z1159 Encounter for screening for other viral diseases: Secondary | ICD-10-CM | POA: Diagnosis not present

## 2023-05-01 DIAGNOSIS — Z2981 Encounter for HIV pre-exposure prophylaxis: Secondary | ICD-10-CM

## 2023-05-01 LAB — HIV ANTIBODY (ROUTINE TESTING W REFLEX): HIV 1&2 Ab, 4th Generation: NONREACTIVE

## 2023-05-01 LAB — HEPATITIS B SURFACE ANTIBODY,QUALITATIVE: Hep B S Ab: NONREACTIVE

## 2023-05-01 LAB — HEPATITIS A ANTIBODY, TOTAL: Hepatitis A AB,Total: NONREACTIVE

## 2023-05-01 LAB — HEPATITIS B CORE ANTIBODY, TOTAL: Hep B Core Total Ab: NONREACTIVE

## 2023-05-01 LAB — HEPATITIS C ANTIBODY: Hepatitis C Ab: NONREACTIVE

## 2023-05-01 LAB — HEPATITIS B SURFACE ANTIGEN: Hepatitis B Surface Ag: NONREACTIVE

## 2023-05-01 MED ORDER — APRETUDE 600 MG/3ML IM SUER
600.0000 mg | INTRAMUSCULAR | 1 refills | Status: DC
  Filled 2023-05-01: qty 3, 30d supply, fill #0
  Filled 2023-05-21: qty 3, 30d supply, fill #1

## 2023-05-01 NOTE — Progress Notes (Signed)
 Specialty Pharmacy Initial Fill Coordination Note  Bruce Cabrera is a 58 y.o. male contacted today regarding initial fill of specialty medication(s) Cabotegravir (Apretude)   Patient requested Courier to Provider Office   Delivery date: 05/05/23   Verified address: [address] [place] [address]   Medication will be filled on 05/02/23.   Patient is aware of 0.00 copayment.

## 2023-05-02 ENCOUNTER — Other Ambulatory Visit: Payer: Self-pay

## 2023-05-02 NOTE — Progress Notes (Signed)
 NEW REFERRAL TO CPP CLINIC    HPI: Bruce Cabrera is a 58 y.o. male who presents to the [facility] pharmacy clinic for Apretude administration and HIV PrEP follow up.  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   ATORVASTATIN (LIPITOR) 80 MG TABLET    Take 1 tablet (80 mg total) by mouth daily.   AZATHIOPRINE (IMURAN) 50 MG TABLET    Take 1 tablet (50 mg total) by mouth daily.   AZATHIOPRINE (IMURAN) 50 MG TABLET    Take 1 tablet (50 mg total) by mouth daily.   BLOOD GLUCOSE MONITORING SUPPL (ONETOUCH VERIO) W/DEVICE KIT    Use to check blood sugar three times daily.   CABOTEGRAVIR ER (APRETUDE) 600 MG/3ML INJECTION    Inject 3 mLs (600 mg total) into the muscle every 30 (thirty) days.   CONTINUOUS GLUCOSE RECEIVER (DEXCOM G7 RECEIVER) DEVI    Use to check blood glucose continuously.   CONTINUOUS GLUCOSE SENSOR (DEXCOM G7 SENSOR) MISC    Use to check blood glucose throughout the day. Changes sensors once every 10 days.   DAPAGLIFLOZIN PROPANEDIOL (FARXIGA) 10 MG TABS TABLET    Take 1 tablet (10 mg total) by mouth daily before breakfast.   DORZOLAMIDE-TIMOLOL (COSOPT) 2-0.5 % OPHTHALMIC SOLUTION    Place 1 drop into both eyes 2 (two) times daily.   DORZOLAMIDE-TIMOLOL (COSOPT) 2-0.5 % OPHTHALMIC SOLUTION    Place 1 drop into both eyes 2 (two) times daily.   DOXYCYCLINE (VIBRA-TABS) 100 MG TABLET     Take 2 tablets (200 mg total) by mouth See admin instructions. Within 72 hours of sexual contact   ERYTHROMYCIN OPHTHALMIC OINTMENT    Place a 1/4 inch ribbon of ointment into the lower eyelid twice daily for  5-7 days.   INSULIN GLARGINE, 1 UNIT DIAL, (TOUJEO) 300 UNIT/ML SOLOSTAR PEN    Inject 32 Units into the skin at bedtime.   INSULIN PEN NEEDLE (TRUEPLUS 5-BEVEL PEN NEEDLES) 32G X 4 MM MISC    Use to inject Toujeo once daily.   PREDNISOLONE ACETATE (PRED FORTE) 1 % OPHTHALMIC SUSPENSION    Instill 1 drop into both eyes four times a day   SEMAGLUTIDE, 2 MG/DOSE, 8 MG/3ML SOPN    Inject 2 mg as directed once a week.   VALSARTAN (DIOVAN) 40 MG TABLET    Take 1 tablet (40 mg total) by mouth daily.  Modified Medications   No medications on file  Discontinued Medications   No medications on file    Allergies: Allergies  Allergen Reactions   Trulicity [Dulaglutide] Other (See Comments)    Abdominal pain    Past Medical History: Past Medical History:  Diagnosis Date   Diabetes mellitus without complication (HCC)    Diabetic foot ulcer (HCC) 10/15/2021   Hx of adenomatous colonic polyps 03/21/2023   2 diminutive adenomas,  sister had colon cancer-recall 2030    Hyperlipidemia associated with type 2 diabetes mellitus (HCC)    based on 2011 profile   Hypertension    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) 05/09/2022    Social History: Social History   Socioeconomic History   Marital status: Single    Spouse name: Not on file   Number of children: Not on file   Years of education: Not on file   Highest education level: Not on file  Occupational History   Occupation: Science writer at [employer]  Tobacco Use   Smoking status: Some Days    Current packs/day: 0.50    Average packs/day: 0.5 packs/day for 30.0 years (15.0 ttl pk-yrs)    Types: Cigarettes   Smokeless tobacco: Never  Vaping Use   Vaping status: Never Used   Substance and Sexual Activity   Alcohol use: Yes    Comment: 2- tallboys a day   Drug use: No   Sexual activity: Not on file  Other Topics Concern   Not on file  Social History Narrative   Lives with roommate   Social Drivers of Health   Financial Resource Strain: High Risk (01/07/2023)   Overall Financial Resource Strain (CARDIA)    Difficulty of Paying Living Expenses: Very hard  Food Insecurity: Food Insecurity Present (04/17/2023)   Hunger Vital Sign    Worried About Running Out of Food in the Last Year: Sometimes true    Ran Out of Food in the Last Year: Never true  Transportation Needs: No Transportation Needs (04/17/2023)   PRAPARE - Administrator, Civil Service (Medical): No    Lack of Transportation (Non-Medical): No  Physical Activity: Inactive (01/07/2023)   Exercise Vital Sign    Days of Exercise per Week: 0 days    Minutes of Exercise per Session: 0 min  Stress: Stress Concern Present (01/07/2023)   [employer] of Occupational Health - Occupational Stress Questionnaire    Feeling of Stress : Very much  Social Connections: Socially Isolated (01/07/2023)   Social Connection and Isolation Panel [NHANES]    Frequency of Communication with Friends and Family: Once a week    Frequency of Social Gatherings with Friends and Family: Once a week    Attends Religious Services: Never    Database administrator or Organizations: No    Attends Engineer, structural: Never    Marital Status: Never married    Labs: No results found for: "HIV1RNAQUANT", "HIV1RNAVL", "CD4TABS"  RPR and STI Lab Results  Component Value Date   LABRPR Reactive (A) 04/10/2023   LABRPR NON REAC 01/11/2008   LABRPR NON REAC 01/27/2007        No data to display          Hepatitis B Lab Results  Component Value Date   HEPBSAB NON-REACTIVE 05/01/2023   HEPBSAG NON-REACTIVE 05/01/2023   HEPBCAB NON-REACTIVE 05/01/2023   Hepatitis C Lab Results  Component  Value Date   HEPCAB NON-REACTIVE 05/01/2023   Hepatitis A Lab Results  Component Value Date   HAV NON-REACTIVE 05/01/2023   Lipids: Lab Results  Component Value Date   CHOL 141 12/19/2022   TRIG 98 12/19/2022   HDL 37 (L) 12/19/2022   CHOLHDL 3.8 12/19/2022   VLDL 20 11/25/2013   LDLCALC 86 12/19/2022    TARGET DATE: The 19th of the month  Assessment: Ciro presents today for their first  initiation injection of Apretude and to follow up for HIV PrEP.  Counseled that Apretude is one intramuscular injection in the gluteal muscle for each visit. Explained that the second injection is 30 days after the initial injection then every 2 months thereafter. Discussed follow up appointments moving forward. Screened patient for acute HIV symptoms such as fatigue, muscle aches, rash, sore throat, lymphadenopathy, headache, night sweats, nausea/vomiting/diarrhea, and fever. Patient denies any symptoms.   Explained that showing up to injection appointments is very important and warned that if appointments are missed, protection will be minimal and the risk of acquiring HIV becomes much higher. Counseled on possible side effects associated with the injections such as injection site pain, which is usually mild to moderate in nature, injection site nodules, and injection site reactions. Asked to call the clinic or send me a mychart message if they experience any issues. Advised that they can take Motrin or Tylenol for injection site pain if needed. They may also pre-treat with Motrin or Tylenol about 30-45 minutes before scheduled appointments.    Per [employer] guidelines, a rapid HIV test should be drawn prior to Apretude administration. Due to state shortage of rapid HIV tests, this is temporarily unable to be done. Per decision from [facility] physicians, we will proceed with Apretude administration at this time without a negative rapid HIV test beforehand. Will not check HIV test today as patient had a  negative test within 7 days and has not been sexually active since then.   Administered cabotegravir 600mg /17mL in right upper outer quadrant of the gluteal muscle. Monitored patient for 10 minutes after injection. Injections were tolerated well without issue. Will make follow up appointments for second initiation injection in 30 days and then maintenance injections every 2 months thereafter.   Eligible for HAV, HBV, COVID, flu, and Shingles vaccines; accepts 1/2 HAV and 1/2 HBV vaccines today. Due for 2/2 HAV vaccine in 6 months and 2/2 HBV vaccine in 1 month.   Plan: - First Apretude injection administered - Second initiation injection scheduled for 4/15 with me  - Maintenance injections scheduled for 6/12 and 8/12 with me  - Administer 1/2 HAV and 1/2 HBV vaccines today  - Call with any issues or questions  Margarite Gouge, PharmD, CPP, BCIDP, AAHIVP Clinical Pharmacist Practitioner Infectious Diseases Clinical Pharmacist [facility]

## 2023-05-04 ENCOUNTER — Other Ambulatory Visit: Payer: Self-pay | Admitting: Nurse Practitioner

## 2023-05-04 DIAGNOSIS — E559 Vitamin D deficiency, unspecified: Secondary | ICD-10-CM

## 2023-05-04 MED ORDER — VITAMIN D (ERGOCALCIFEROL) 1.25 MG (50000 UNIT) PO CAPS
50000.0000 [IU] | ORAL_CAPSULE | ORAL | 0 refills | Status: DC
  Filled 2023-05-04: qty 12, 84d supply, fill #0

## 2023-05-05 ENCOUNTER — Telehealth: Payer: Self-pay

## 2023-05-05 ENCOUNTER — Other Ambulatory Visit: Payer: Self-pay

## 2023-05-05 NOTE — Telephone Encounter (Signed)
 [facility] Patient Advocate Encounter  Patient's medications Apretude have been couriered to [facility] from [facility] pharmacy and will be administered at the patients appointment on 05/07/23.  Clearance Coots, CPhT Specialty Pharmacy Patient [facility] Phone: [phone] Fax:  [phone]

## 2023-05-06 ENCOUNTER — Encounter: Payer: Self-pay | Admitting: Internal Medicine

## 2023-05-07 ENCOUNTER — Other Ambulatory Visit: Payer: Self-pay

## 2023-05-07 ENCOUNTER — Ambulatory Visit (INDEPENDENT_AMBULATORY_CARE_PROVIDER_SITE_OTHER): Admitting: Pharmacist

## 2023-05-07 ENCOUNTER — Encounter: Payer: Self-pay | Admitting: Oncology

## 2023-05-07 DIAGNOSIS — Z23 Encounter for immunization: Secondary | ICD-10-CM

## 2023-05-07 DIAGNOSIS — Z2981 Encounter for HIV pre-exposure prophylaxis: Secondary | ICD-10-CM

## 2023-05-07 MED ORDER — CABOTEGRAVIR ER 600 MG/3ML IM SUER
600.0000 mg | Freq: Once | INTRAMUSCULAR | Status: AC
  Administered 2023-05-07: 600 mg via INTRAMUSCULAR

## 2023-05-08 ENCOUNTER — Ambulatory Visit: Payer: Self-pay | Admitting: Licensed Clinical Social Worker

## 2023-05-08 NOTE — Patient Outreach (Signed)
 Care Coordination   05/08/2023 Name: Bruce Cabrera MRN: [id number] DOB: 1965/04/26   Care Coordination Outreach Attempts:  An unsuccessful telephone outreach was attempted today to offer the patient information about available complex care management services.  Follow Up Plan:  Additional outreach attempts will be made to offer the patient complex care management information and services.   Encounter Outcome:  No Answer   Care Coordination Interventions:  No, not indicated    Kenton Kingfisher, LCSW [place], [facility] Licensed Clinical Social Worker Care Coordinator [phone]

## 2023-05-12 ENCOUNTER — Telehealth: Payer: Self-pay | Admitting: Licensed Clinical Social Worker

## 2023-05-12 ENCOUNTER — Other Ambulatory Visit: Payer: Self-pay

## 2023-05-12 ENCOUNTER — Ambulatory Visit: Admitting: Podiatry

## 2023-05-12 NOTE — Patient Outreach (Signed)
 Care Coordination   Follow Up Visit Note   05/12/2023 Name: Bruce Cabrera MRN: [id number] DOB: [birth year]  Bruce Cabrera is a 58 y.o. year old male who sees Bruce Rigg, NP for primary care. I spoke with  Bruce Cabrera by phone today.  What matters to the patients health and wellness today?  Increasing access to community housing options.    Goals Addressed             This Visit's Progress    COMPLETED: Care Coordination       Care Coordination Interventions: Assessed Social Determinants of Health Reviewed all upcoming appointments in Epic system Solution-Focused Strategies employed:  Active listening / Reflection utilized  Emotional Support Provided Review housing and food resources sent in mail Consider Mental Health counseling referral, Please CSW or provider if you would like one completed Review housing search resource sent by e-mail Review Mental Health resources sent by e-mail Monitor for changes in mood and utilize coping skills as needed. Please reach out to provider if you would like to reconnect with Child psychotherapist          SDOH assessments and interventions completed:  Yes     Care Coordination Interventions:  Yes, provided   Interventions Today    Flowsheet Row Most Recent Value  Chronic Disease   Chronic disease during today's visit Other  [Anxiety]  General Interventions   General Interventions Discussed/Reviewed General Interventions Discussed, [facility] reported that he has looked into housing options but most places have waitlist - CSW reviewed housing search options and agreed to send to pt by e-mail. Pt voiced confidence in being able to access resource. Denied any food resource needs.]  Education Interventions   Education Provided Provided Education  [Pt denied counseling referral but agreed to consider it at a future date - will reach out to CSW/provider.]  Mental Health Interventions   Mental Health Discussed/Reviewed Mental  Health Discussed, Mental Health Reviewed, Coping Strategies, Anxiety  [CSW and pt discussed his current stressors and how he is managing his anxiety. Pt reported he has some bad days - coping skills and showering and napping. Reviewed alternative coping skills - going for walks and reaching out to family.]        Follow up plan: No further intervention required.   Encounter Outcome:  Patient Visit Completed   Bruce Kingfisher, LCSW [place], [facility] Licensed Clinical Social Worker Care Coordinator [phone]

## 2023-05-12 NOTE — Patient Instructions (Signed)
 Visit Information  Thank you for taking time to visit with me today. Please don't hesitate to contact me if I can be of assistance to you.   Following are the goals we discussed today:   Goals Addressed             This Visit's Progress    COMPLETED: Care Coordination       Care Coordination Interventions: Assessed Social Determinants of Health Reviewed all upcoming appointments in Epic system Solution-Focused Strategies employed:  Active listening / Reflection utilized  Emotional Support Provided Review housing and food resources sent in mail Consider Mental Health counseling referral, Please CSW or provider if you would like one completed Review housing search resource sent by e-mail Review Mental Health resources sent by e-mail Monitor for changes in mood and utilize coping skills as needed. Please reach out to provider if you would like to reconnect with Social Worker           Please call the care guide team at [phone] if you need to cancel or reschedule your appointment.   If you are experiencing a Mental Health or Behavioral Health Crisis or need someone to talk to, please call the Suicide and Crisis Lifeline: 988  Patient verbalizes understanding of instructions and care plan provided today and agrees to view in MyChart. Active MyChart status and patient understanding of how to access instructions and care plan via MyChart confirmed with patient.     No further follow up required: Please contact provider if you would like to be reconnected.  Kenton Kingfisher, LCSW [facility], [facility] Licensed Clinical Social Worker Care Coordinator [phone]

## 2023-05-19 ENCOUNTER — Encounter: Payer: Self-pay | Admitting: Podiatry

## 2023-05-19 ENCOUNTER — Ambulatory Visit (INDEPENDENT_AMBULATORY_CARE_PROVIDER_SITE_OTHER): Admitting: Podiatry

## 2023-05-19 ENCOUNTER — Other Ambulatory Visit: Payer: Self-pay

## 2023-05-19 DIAGNOSIS — L97512 Non-pressure chronic ulcer of other part of right foot with fat layer exposed: Secondary | ICD-10-CM

## 2023-05-19 DIAGNOSIS — E119 Type 2 diabetes mellitus without complications: Secondary | ICD-10-CM

## 2023-05-19 DIAGNOSIS — D2372 Other benign neoplasm of skin of left lower limb, including hip: Secondary | ICD-10-CM

## 2023-05-19 NOTE — Progress Notes (Signed)
 Chief Complaint  Patient presents with   Callouses    Trim calluses - 5th MPJ base left and sub 1st MPJ right   "He did surgery to help these places, but they won't go away. I also got orthotics to take pressure off, nothing is working"  Diabetic - 8.1    Subjective:  Patient presents today status post fibular sesamoidectomy of the right foot.  DOS: 08/29/2022.  Patient continues to have recurrence of the symptomatic skin lesions to the bilateral feet.  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) 05/09/2022    Past Surgical History:  Procedure Laterality Date   COLONOSCOPY     COLONOSCOPY W/ POLYPECTOMY  03/21/2023   FOOT SURGERY Left    LEFT HEART CATHETERIZATION WITH CORONARY ANGIOGRAM N/A 11/29/2013   Procedure: LEFT HEART CATHETERIZATION WITH CORONARY ANGIOGRAM;  Surgeon: Lennette Bihari, MD;  Location: [facility] CATH LAB;  Service: Cardiovascular;  Laterality: N/A;    Allergies  Allergen Reactions   Trulicity [Dulaglutide] Other (See Comments)    Abdominal pain    LT foot 05/19/2023  RT foot 05/19/2023  Objective/Physical Exam Ulcer noted to the plantar aspect of the right foot measuring approximately 1.5 x 0.8 x 0.2 cm.  Granular wound base.  There is some callus and macerated tissue around the area as well.  No purulence.  Scant serous drainage.  Clinically no indication of infection that does not extend down into bowel or joint. Hyperkeratotic skin lesion also noted along the lateral column of the left foot with a central nucleated  core.  Please see above noted photo  Radiographic Exam RT foot 09/04/2022:  Absence of the fibular sesamoid noted.  Impression: Interval resection of the fibular sesamoid right  MR TOES RIGHT WO CONTRAST 01/11/2023 IMPRESSION: 1. Mild flexor hallucis longus tenosynovitis both proximal to and distal to the first MTP joint. 2. Mildly thickened and indistinct adductor hallucis tendon, compatible with tendinopathy 3. Absent lateral sesamoid of the first digit. Bifid medial sesamoid of the first digit without internal edema signal. 4. Small dorsal effusion of the first MTP joint. 5. Mild degenerative findings along the interphalangeal joint of the great toe.  Assessment: 1. s/p sesamoidectomy right. DOS: 08/29/2022 2.  Ulcer plantar aspect of the first MTP right with fat layer exposed 3.  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  Encounter for diabetic foot exam   Plan of Care:  -Patient was evaluated.  - Comprehensive diabetic foot exam performed today -Medically necessary excisional debridement including subcutaneous tissue was performed today using a tissue nipper to the right plantar first MTP ulcer.  Excisional debridement of the necrotic nonviable tissue down to healthy bleeding viable tissue was performed with postdebridement measurement symptoms. -Excisional debridement of the hyperkeratotic eccrine poroma to the lateral column of the left foot was also performed today.  Salicylic acid applied. -Patient continues to wear diabetic insoles with his shoes.  We have tried different offloading techniques with minimal improvement conservatively.  He does not go barefoot around the  house. -Today we discussed additional surgery to resolve the plantar ulcer to the right foot which would likely include sesamoidectomy of the tibial sesamoid, Jones tenosuspension, and IPJ arthrodesis.  This should help offload pressure from the wound and allow for potential healing.  Unfortunately I  do not have any additional options to the lateral column left foot eccrine poroma which is very symptomatic.  We have excised the lesion in its entirety with primary closure but it continues to recur. -Return to clinic 3 months routine footcare  *Works at the [employer]  Felecia Shelling, DPM [facility]  Dr. Felecia Shelling, DPM    [address] [place], [place] [address]                Office [phone]  Fax [phone]

## 2023-05-20 ENCOUNTER — Other Ambulatory Visit: Payer: Self-pay

## 2023-05-20 NOTE — Addendum Note (Signed)
 Encounter addended by: Edward Qualia on: 05/20/2023 1:07 PM  Actions taken: Imaging Exam ended

## 2023-05-21 ENCOUNTER — Other Ambulatory Visit (HOSPITAL_COMMUNITY): Payer: Self-pay

## 2023-05-21 ENCOUNTER — Other Ambulatory Visit: Payer: Self-pay

## 2023-05-21 NOTE — Progress Notes (Signed)
 Specialty Pharmacy Refill Coordination Note  Bruce Cabrera is a 58 y.o. male assessed today regarding refills of clinic administered specialty medication(s) Cabotegravir (Apretude)   Clinic requested Courier to Provider Office   Delivery date: 05/29/23   Verified address: [address] [place] [address]   Medication will be filled on 05/28/23.

## 2023-05-26 ENCOUNTER — Other Ambulatory Visit: Payer: Self-pay

## 2023-05-27 ENCOUNTER — Other Ambulatory Visit: Payer: Self-pay

## 2023-05-27 ENCOUNTER — Other Ambulatory Visit: Payer: Self-pay | Admitting: Nurse Practitioner

## 2023-05-27 DIAGNOSIS — E119 Type 2 diabetes mellitus without complications: Secondary | ICD-10-CM

## 2023-05-27 MED ORDER — SEMAGLUTIDE (2 MG/DOSE) 8 MG/3ML ~~LOC~~ SOPN
2.0000 mg | PEN_INJECTOR | SUBCUTANEOUS | 3 refills | Status: DC
  Filled 2023-05-27: qty 3, 28d supply, fill #0
  Filled 2023-07-07 – 2023-09-03 (×2): qty 3, 28d supply, fill #1
  Filled 2023-09-26: qty 3, 28d supply, fill #2
  Filled 2023-10-26: qty 3, 28d supply, fill #3

## 2023-05-28 ENCOUNTER — Other Ambulatory Visit: Payer: Self-pay

## 2023-05-29 ENCOUNTER — Other Ambulatory Visit: Payer: Self-pay

## 2023-05-29 ENCOUNTER — Telehealth: Payer: Self-pay

## 2023-05-29 NOTE — Telephone Encounter (Signed)
 [facility] Patient Advocate Encounter  Patient's medications APRETUDE have been couriered to [facility] from [facility] pharmacy and will be administered at the patients appointment on 06/03/23.  Kae Heller, CPhT Specialty Pharmacy Patient [facility] for Infectious Disease Phone: [phone] Fax:  [phone]

## 2023-06-02 NOTE — Progress Notes (Unsigned)
 HPI: Bruce Cabrera is a 58 y.o. male who presents to the [facility] pharmacy clinic for Apretude administration and HIV PrEP follow up.  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   ATORVASTATIN (LIPITOR) 80 MG TABLET    Take 1 tablet (80 mg total) by mouth daily.   AZATHIOPRINE (IMURAN) 50 MG TABLET    Take 1 tablet (50 mg total) by mouth daily.   AZATHIOPRINE (IMURAN) 50 MG TABLET    Take 1 tablet (50 mg total) by mouth daily.   BLOOD GLUCOSE MONITORING SUPPL (ONETOUCH VERIO) W/DEVICE KIT    Use to check blood sugar three times daily.   CABOTEGRAVIR ER (APRETUDE) 600 MG/3ML INJECTION    Inject 3 mLs (600 mg total) into the muscle every 30 (thirty) days.   CONTINUOUS GLUCOSE RECEIVER (DEXCOM G7 RECEIVER) DEVI    Use to check blood glucose continuously.   CONTINUOUS GLUCOSE SENSOR (DEXCOM G7 SENSOR) MISC    Use to check blood glucose throughout the day. Changes sensors once every 10 days.   DAPAGLIFLOZIN PROPANEDIOL (FARXIGA) 10 MG TABS TABLET    Take 1 tablet (10 mg total) by mouth daily before breakfast.   DORZOLAMIDE-TIMOLOL (COSOPT) 2-0.5 % OPHTHALMIC SOLUTION    Place 1 drop into both eyes 2 (two) times daily.   DORZOLAMIDE-TIMOLOL (COSOPT) 2-0.5 % OPHTHALMIC SOLUTION    Place 1 drop into both eyes 2 (two) times daily.   DOXYCYCLINE (VIBRA-TABS) 100 MG TABLET     Take 2 tablets (200 mg total) by mouth See admin instructions. Within 72 hours of sexual contact   ERYTHROMYCIN OPHTHALMIC OINTMENT    Place a 1/4 inch ribbon of ointment into the lower eyelid twice daily for  5-7 days.   INSULIN GLARGINE, 1 UNIT DIAL, (TOUJEO) 300 UNIT/ML SOLOSTAR PEN    Inject 32 Units into the skin at bedtime.   INSULIN PEN NEEDLE (TRUEPLUS 5-BEVEL PEN NEEDLES) 32G X 4 MM MISC    Use to inject Toujeo once daily.   PREDNISOLONE ACETATE (PRED FORTE) 1 % OPHTHALMIC SUSPENSION    Instill 1 drop into both eyes four times a day   SEMAGLUTIDE, 2 MG/DOSE, 8 MG/3ML SOPN    Inject 2 mg as directed once a week.   VALSARTAN (DIOVAN) 40 MG TABLET    Take 1 tablet (40 mg total) by mouth daily.   VITAMIN D, ERGOCALCIFEROL, (DRISDOL) 1.25 MG (50000 UNIT) CAPS CAPSULE    Take 1 capsule (50,000 Units total) by mouth once a week.  Modified Medications   No medications on file  Discontinued Medications   No medications on file    Allergies: Allergies  Allergen Reactions   Trulicity [Dulaglutide] Other (See Comments)    Abdominal pain    Labs: No results found for: "HIV1RNAQUANT", "HIV1RNAVL", "  CD4TABS"  RPR and STI Lab Results  Component Value Date   LABRPR Reactive (A) 04/10/2023   LABRPR NON REAC 01/11/2008   LABRPR NON REAC 01/27/2007        No data to display          Hepatitis B Lab Results  Component Value Date   HEPBSAB NON-REACTIVE 05/01/2023   HEPBSAG NON-REACTIVE 05/01/2023   HEPBCAB NON-REACTIVE 05/01/2023   Hepatitis C Lab Results  Component Value Date   HEPCAB NON-REACTIVE 05/01/2023   Hepatitis A Lab Results  Component Value Date   HAV NON-REACTIVE 05/01/2023   Lipids: Lab Results  Component Value Date   CHOL 141 12/19/2022   TRIG 98 12/19/2022   HDL 37 (L) 12/19/2022   CHOLHDL 3.8 12/19/2022   VLDL 20 11/25/2013   LDLCALC 86 12/19/2022    TARGET DATE: 19th   Assessment: Bruce Cabrera presents today for his second initiation Apretude  injection and to follow up for HIV PrEP. No issues with past injections. Denies any symptoms of acute HIV. Bruce Cabrera respectfully declines STI testing today.   Per [employer] guidelines, a rapid HIV test should be drawn prior to Apretude administration. Due to state shortage of rapid HIV tests, this is temporarily unable to be done. Per decision from [facility] physicians, we will proceed with Apretude administration at this time without a negative rapid HIV test beforehand. HIV RNA was collected today and is in process.  Administered cabotegravir 600mg /34mL in right upper outer quadrant of the gluteal muscle. Will see Bruce Cabrera back in 2 months for injection, labs, and HIV PrEP follow up.  Immunizations: he is eligible for his second hepatitis B vaccination today, shingles, COVID. Agrees to hepatitis B shot. Will be due for second hepatitis A in 5 months   Plan: - Apretude injection administered - HBV vaccination administered to left deltoid - HIV RNA today - Next injection, labs, and PrEP follow up appointment scheduled for 07/31/2023 with Mylinda Asa - Call with any issues or questions  Thank you for involving pharmacy in the patient's care.   Barbra Boone, PharmD PGY1 Acute Care Pharmacy Resident  06/03/2023 9:35 AM

## 2023-06-03 ENCOUNTER — Other Ambulatory Visit (HOSPITAL_COMMUNITY): Payer: Self-pay

## 2023-06-03 ENCOUNTER — Other Ambulatory Visit: Payer: Self-pay

## 2023-06-03 ENCOUNTER — Ambulatory Visit: Admitting: Pharmacist

## 2023-06-03 DIAGNOSIS — Z23 Encounter for immunization: Secondary | ICD-10-CM | POA: Diagnosis not present

## 2023-06-03 DIAGNOSIS — Z2981 Encounter for HIV pre-exposure prophylaxis: Secondary | ICD-10-CM

## 2023-06-03 MED ORDER — APRETUDE 600 MG/3ML IM SUER
600.0000 mg | INTRAMUSCULAR | 5 refills | Status: AC
  Filled 2023-06-03 – 2023-07-18 (×2): qty 3, 60d supply, fill #0
  Filled 2023-09-22: qty 3, 60d supply, fill #1
  Filled 2023-11-21: qty 3, 60d supply, fill #2
  Filled 2024-01-16: qty 3, 60d supply, fill #3
  Filled 2024-03-22: qty 3, 60d supply, fill #4

## 2023-06-03 MED ORDER — CABOTEGRAVIR ER 600 MG/3ML IM SUER
600.0000 mg | Freq: Once | INTRAMUSCULAR | Status: AC
  Administered 2023-06-03: 600 mg via INTRAMUSCULAR

## 2023-06-05 LAB — HIV-1 RNA QUANT-NO REFLEX-BLD
HIV 1 RNA Quant: NOT DETECTED {copies}/mL
HIV-1 RNA Quant, Log: NOT DETECTED {Log_copies}/mL

## 2023-06-06 ENCOUNTER — Other Ambulatory Visit: Payer: Self-pay

## 2023-06-18 ENCOUNTER — Other Ambulatory Visit: Payer: Self-pay

## 2023-06-18 MED ORDER — AZATHIOPRINE 50 MG PO TABS
50.0000 mg | ORAL_TABLET | Freq: Every day | ORAL | 3 refills | Status: DC
Start: 2023-06-18 — End: 2023-12-31
  Filled 2023-06-18: qty 90, 90d supply, fill #0
  Filled 2023-10-06: qty 90, 90d supply, fill #1

## 2023-06-18 MED ORDER — DORZOLAMIDE HCL-TIMOLOL MAL 2-0.5 % OP SOLN
1.0000 [drp] | Freq: Two times a day (BID) | OPHTHALMIC | 11 refills | Status: AC
Start: 2023-06-18 — End: ?
  Filled 2023-06-19: qty 10, 50d supply, fill #0

## 2023-06-19 ENCOUNTER — Other Ambulatory Visit: Payer: Self-pay

## 2023-06-23 ENCOUNTER — Other Ambulatory Visit: Payer: Self-pay

## 2023-06-23 ENCOUNTER — Ambulatory Visit: Admitting: Internal Medicine

## 2023-06-24 ENCOUNTER — Other Ambulatory Visit: Payer: Self-pay

## 2023-06-25 ENCOUNTER — Other Ambulatory Visit: Payer: Self-pay

## 2023-06-25 ENCOUNTER — Other Ambulatory Visit: Payer: Self-pay | Admitting: Nurse Practitioner

## 2023-06-25 DIAGNOSIS — H30033 Focal chorioretinal inflammation, peripheral, bilateral: Secondary | ICD-10-CM | POA: Diagnosis not present

## 2023-06-25 DIAGNOSIS — Z79899 Other long term (current) drug therapy: Secondary | ICD-10-CM | POA: Diagnosis not present

## 2023-06-26 MED ORDER — BUPROPION HCL ER (XL) 150 MG PO TB24
150.0000 mg | ORAL_TABLET | Freq: Every day | ORAL | 2 refills | Status: DC
  Filled 2023-06-26: qty 60, 60d supply, fill #0
  Filled 2023-08-27: qty 60, 60d supply, fill #1
  Filled 2023-10-26: qty 60, 60d supply, fill #2

## 2023-06-27 ENCOUNTER — Other Ambulatory Visit: Payer: Self-pay

## 2023-07-01 ENCOUNTER — Other Ambulatory Visit: Payer: Self-pay

## 2023-07-01 DIAGNOSIS — E119 Type 2 diabetes mellitus without complications: Secondary | ICD-10-CM | POA: Diagnosis not present

## 2023-07-01 DIAGNOSIS — Z79899 Other long term (current) drug therapy: Secondary | ICD-10-CM | POA: Diagnosis not present

## 2023-07-01 DIAGNOSIS — H30033 Focal chorioretinal inflammation, peripheral, bilateral: Secondary | ICD-10-CM | POA: Diagnosis not present

## 2023-07-01 DIAGNOSIS — A523 Neurosyphilis, unspecified: Secondary | ICD-10-CM | POA: Diagnosis not present

## 2023-07-01 MED ORDER — AZATHIOPRINE 50 MG PO TABS
50.0000 mg | ORAL_TABLET | Freq: Every day | ORAL | 3 refills | Status: DC
  Filled 2023-07-01 – 2023-10-06 (×3): qty 90, 90d supply, fill #0
  Filled 2023-12-08 – 2023-12-17 (×4): qty 90, 90d supply, fill #1

## 2023-07-01 MED ORDER — DORZOLAMIDE HCL-TIMOLOL MAL 2-0.5 % OP SOLN
1.0000 [drp] | Freq: Two times a day (BID) | OPHTHALMIC | 11 refills | Status: DC
Start: 2023-07-01 — End: 2023-12-31
  Filled 2023-07-01: qty 10, 100d supply, fill #0
  Filled 2023-08-18 – 2023-10-06 (×2): qty 10, 50d supply, fill #0
  Filled 2023-12-17: qty 10, 50d supply, fill #1

## 2023-07-02 ENCOUNTER — Other Ambulatory Visit: Payer: Self-pay

## 2023-07-07 ENCOUNTER — Other Ambulatory Visit: Payer: Self-pay

## 2023-07-10 ENCOUNTER — Other Ambulatory Visit: Payer: Self-pay

## 2023-07-17 ENCOUNTER — Other Ambulatory Visit: Payer: Self-pay

## 2023-07-18 ENCOUNTER — Other Ambulatory Visit (HOSPITAL_COMMUNITY): Payer: Self-pay

## 2023-07-18 ENCOUNTER — Other Ambulatory Visit: Payer: Self-pay

## 2023-07-18 NOTE — Progress Notes (Signed)
 Specialty Pharmacy Refill Coordination Note  Bruce Cabrera is a 58 y.o. male assessed today regarding refills of clinic administered specialty medication(s) Cabotegravir  (Apretude )   Clinic requested Courier to Provider Office   Delivery date: 07/23/23   Verified address: [address] [place] [address]   Medication will be filled on 07/22/23.

## 2023-07-22 ENCOUNTER — Other Ambulatory Visit: Payer: Self-pay

## 2023-07-23 ENCOUNTER — Other Ambulatory Visit: Payer: Self-pay

## 2023-07-23 ENCOUNTER — Telehealth: Payer: Self-pay

## 2023-07-23 NOTE — Telephone Encounter (Signed)
 [facility] Patient Advocate Encounter  Patient's medications APRETUDE  have been couriered to [facility] from [facility] and will be administered at the patients appointment on 07/31/23.  Verline Glow, CPhT Specialty Pharmacy Patient [facility] for Infectious Disease Phone: [phone] Fax:  [phone]

## 2023-07-29 ENCOUNTER — Encounter: Payer: Self-pay | Admitting: Nurse Practitioner

## 2023-07-29 ENCOUNTER — Ambulatory Visit: Attending: Nurse Practitioner | Admitting: Nurse Practitioner

## 2023-07-29 ENCOUNTER — Other Ambulatory Visit: Payer: Self-pay

## 2023-07-29 VITALS — BP 153/83 | HR 74 | Ht 75.0 in | Wt 279.2 lb

## 2023-07-29 DIAGNOSIS — D649 Anemia, unspecified: Secondary | ICD-10-CM

## 2023-07-29 DIAGNOSIS — I10 Essential (primary) hypertension: Secondary | ICD-10-CM

## 2023-07-29 DIAGNOSIS — E119 Type 2 diabetes mellitus without complications: Secondary | ICD-10-CM

## 2023-07-29 DIAGNOSIS — E785 Hyperlipidemia, unspecified: Secondary | ICD-10-CM

## 2023-07-29 DIAGNOSIS — Z7985 Long-term (current) use of injectable non-insulin antidiabetic drugs: Secondary | ICD-10-CM

## 2023-07-29 DIAGNOSIS — E1169 Type 2 diabetes mellitus with other specified complication: Secondary | ICD-10-CM

## 2023-07-29 DIAGNOSIS — E1165 Type 2 diabetes mellitus with hyperglycemia: Secondary | ICD-10-CM

## 2023-07-29 LAB — POCT GLYCOSYLATED HEMOGLOBIN (HGB A1C): HbA1c, POC (controlled diabetic range): 6.6 % (ref 0.0–7.0)

## 2023-07-29 MED ORDER — AMLODIPINE BESYLATE 5 MG PO TABS
5.0000 mg | ORAL_TABLET | Freq: Every day | ORAL | 1 refills | Status: DC
  Filled 2023-07-29: qty 90, 90d supply, fill #0
  Filled 2023-10-26: qty 90, 90d supply, fill #1

## 2023-07-29 MED ORDER — INSULIN GLARGINE (1 UNIT DIAL) 300 UNIT/ML ~~LOC~~ SOPN
40.0000 [IU] | PEN_INJECTOR | Freq: Every day | SUBCUTANEOUS | 4 refills | Status: AC
  Filled 2023-07-29 (×3): qty 4.5, 33d supply, fill #0
  Filled 2023-08-27: qty 4.5, 33d supply, fill #1
  Filled 2023-09-29: qty 4.5, 33d supply, fill #2
  Filled 2023-12-30: qty 4.5, 33d supply, fill #3
  Filled 2024-02-15: qty 4.5, 33d supply, fill #4

## 2023-07-29 NOTE — Progress Notes (Unsigned)
 Assessment & Plan:   [name] was seen today for diabetes.  Diagnoses and all orders for this visit:  Type 2 diabetes mellitus with hyperglycemia, unspecified whether long term insulin  use (HCC) -     POCT glycosylated hemoglobin (Hb A1C) -     insulin  glargine, 1 Unit Dial , (TOUJEO ) 300 UNIT/ML Solostar Pen; Inject 40 Units into the skin at bedtime.    Patient has been counseled on age-appropriate routine health concerns for screening and prevention. These are reviewed and up-to-date. Referrals have been placed accordingly. Immunizations are up-to-date or declined.    Subjective:   Chief Complaint  Patient presents with   Diabetes    Bruce Cabrera 58 y.o. male presents to office today for HTN and DM  He has a past medical history of diabetes, neurosyphilis (ocular), depression, diabetes foot ulcer, hyperlipidemia, hypertension and CKD stage V.     Blood pressure is elevated today. He is currently prescribed valsartan  40 mg daily and amlodipine  10 mg daily.  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) 05/09/2022    Past Surgical History:  Procedure Laterality Date   COLONOSCOPY     COLONOSCOPY W/ POLYPECTOMY  03/21/2023   FOOT SURGERY Left    LEFT HEART CATHETERIZATION WITH CORONARY ANGIOGRAM N/A 11/29/2013   Procedure: LEFT HEART  CATHETERIZATION WITH CORONARY ANGIOGRAM;  Surgeon: Millicent Ally, MD;  Location: [facility] CATH LAB;  Service: Cardiovascular;  Laterality: N/A;    Family History  Problem Relation Age of Onset   Heart attack Mother 50   Colon polyps Sister    Colon cancer Sister    Stroke Sister    Heart failure Brother 40   Rectal cancer Neg Hx    Stomach cancer Neg Hx    Esophageal cancer Neg Hx     Social History Reviewed with no changes to be made today.   Outpatient Medications Prior to Visit  Medication Sig Dispense Refill   amLODipine  (NORVASC ) 10 MG tablet Take 1 tablet (10 mg total) by mouth daily. 90 tablet 2   atorvastatin  (LIPITOR) 80 MG tablet Take 1 tablet (80 mg total) by mouth daily. 90 tablet 3   azaTHIOprine  (IMURAN ) 50 MG tablet Take 1 tablet (50 mg total) by mouth daily. 90 tablet 3   azaTHIOprine  (IMURAN ) 50 MG tablet Take 1 tablet (50 mg total) by mouth daily. 90 tablet 3   buPROPion  (WELLBUTRIN  XL) 150 MG 24 hr tablet Take 1 tablet (150 mg total) by mouth daily. 60 tablet 2   cabotegravir  ER (APRETUDE ) 600 MG/3ML injection Inject 3 mLs (600 mg total) into the muscle every 2 (two) months. 3 mL 5   Continuous Glucose Receiver (DEXCOM G7 RECEIVER) DEVI Use to check blood glucose continuously. 1 each 0   Continuous  Glucose Sensor (DEXCOM G7 SENSOR) MISC Use to check blood glucose throughout the day. Changes sensors once every 10 days. 3 each 6   dapagliflozin  propanediol (FARXIGA ) 10 MG TABS tablet Take 1 tablet (10 mg total) by mouth daily before breakfast. 30 tablet 4   dorzolamide -timolol  (COSOPT ) 2-0.5 % ophthalmic solution Place 1 drop into both eyes 2 (two) times daily. 10 mL 11   dorzolamide -timolol  (COSOPT ) 2-0.5 % ophthalmic solution Place 1 drop into both eyes 2 (two) times daily. 10 mL 11   dorzolamide -timolol  (COSOPT ) 2-0.5 % ophthalmic solution Place 1 drop into both eyes 2 (two) times daily. 10 mL 11   dorzolamide -timolol  (COSOPT ) 2-0.5 % ophthalmic solution Place 1 drop  into both eyes in the morning and at bedtime. 10 mL 11   doxycycline  (VIBRA -TABS) 100 MG tablet Take 2 tablets (200 mg total) by mouth See admin instructions. Within 72 hours of sexual contact 20 tablet 0   Insulin  Pen Needle (TRUEPLUS 5-BEVEL PEN NEEDLES) 32G X 4 MM MISC Use to inject Toujeo  once daily. 100 each 2   valsartan  (DIOVAN ) 40 MG tablet Take 1 tablet (40 mg total) by mouth daily. 90 tablet 3   Vitamin D , Ergocalciferol , (DRISDOL ) 1.25 MG (50000 UNIT) CAPS capsule Take 1 capsule (50,000 Units total) by mouth once a week. 12 capsule 0   azaTHIOprine  (IMURAN ) 50 MG tablet Take 1 tablet (50 mg total) by mouth daily. 90 tablet 3   azaTHIOprine  (IMURAN ) 50 MG tablet Take 1 tablet (50 mg total) by mouth daily. 90 tablet 3   insulin  glargine, 1 Unit Dial , (TOUJEO ) 300 UNIT/ML Solostar Pen Inject 32 Units into the skin at bedtime. 4.5 mL 4   Semaglutide , 2 MG/DOSE, 8 MG/3ML SOPN Inject 2 mg as directed once a week. (Patient not taking: Reported on 07/29/2023) 3 mL 3   Blood Glucose Monitoring Suppl (ONETOUCH VERIO) w/Device KIT Use to check blood sugar three times daily. (Patient not taking: Reported on 07/29/2023) 1 kit 0   erythromycin  ophthalmic ointment Place a 1/4 inch ribbon of ointment into the lower eyelid twice daily for  5-7 days. (Patient not taking: Reported on 07/29/2023) 3.5 g 0   prednisoLONE  acetate (PRED FORTE ) 1 % ophthalmic suspension Instill 1 drop into both eyes four times a day (Patient not taking: Reported on 07/29/2023) 10 mL 0   No facility-administered medications prior to visit.    Allergies  Allergen Reactions   Trulicity  [Dulaglutide ] Other (See Comments)    Abdominal pain       Objective:    BP (!) 153/83 (BP Location: Right Arm, Patient Position: Sitting, Cuff Size: Large)   Pulse 74   Ht 6\' 3"  (1.905 m)   Wt 279 lb 3.2 oz (126.6 kg)   SpO2 99%   BMI 34.90 kg/m  Wt Readings from Last 3 Encounters:  07/29/23 279 lb 3.2 oz (126.6 kg)  04/28/23 259 lb 12.8  oz (117.8 kg)  04/24/23 262 lb 4.8 oz (119 kg)    Physical Exam       Patient has been counseled extensively about nutrition and exercise as well as the importance of adherence with medications and regular follow-up. The patient was given clear instructions to go to ER or return to medical center if symptoms don't improve, worsen or new problems develop. The patient verbalized understanding.   Follow-up: Return in about 3 months (around 10/29/2023).   Collins Dean, FNP-BC [facility] and [facility] [place], [place] [phone]  07/29/2023, 10:10 AM

## 2023-07-30 ENCOUNTER — Ambulatory Visit: Payer: Self-pay | Admitting: Nurse Practitioner

## 2023-07-30 ENCOUNTER — Encounter: Payer: Self-pay | Admitting: Nurse Practitioner

## 2023-07-30 ENCOUNTER — Other Ambulatory Visit: Payer: Self-pay

## 2023-07-30 LAB — VITAMIN D 25 HYDROXY (VIT D DEFICIENCY, FRACTURES): Vit D, 25-Hydroxy: 36.1 ng/mL (ref 30.0–100.0)

## 2023-07-30 LAB — BASIC METABOLIC PANEL WITH GFR
BUN/Creatinine Ratio: 12 (ref 9–20)
BUN: 14 mg/dL (ref 6–24)
CO2: 19 mmol/L — ABNORMAL LOW (ref 20–29)
Calcium: 9.2 mg/dL (ref 8.7–10.2)
Chloride: 107 mmol/L — ABNORMAL HIGH (ref 96–106)
Creatinine, Ser: 1.19 mg/dL (ref 0.76–1.27)
Glucose: 183 mg/dL — ABNORMAL HIGH (ref 70–99)
Potassium: 4.2 mmol/L (ref 3.5–5.2)
Sodium: 141 mmol/L (ref 134–144)
eGFR: 72 mL/min/1.73

## 2023-07-30 LAB — CBC WITH DIFFERENTIAL/PLATELET
Basophils Absolute: 0 x10E3/uL (ref 0.0–0.2)
Basos: 0 %
EOS (ABSOLUTE): 0.2 x10E3/uL (ref 0.0–0.4)
Eos: 4 %
Hematocrit: 42.2 % (ref 37.5–51.0)
Hemoglobin: 13.5 g/dL (ref 13.0–17.7)
Immature Grans (Abs): 0 x10E3/uL (ref 0.0–0.1)
Immature Granulocytes: 0 %
Lymphocytes Absolute: 2.4 x10E3/uL (ref 0.7–3.1)
Lymphs: 48 %
MCH: 24.9 pg — ABNORMAL LOW (ref 26.6–33.0)
MCHC: 32 g/dL (ref 31.5–35.7)
MCV: 78 fL — ABNORMAL LOW (ref 79–97)
Monocytes Absolute: 0.6 x10E3/uL (ref 0.1–0.9)
Monocytes: 11 %
Neutrophils Absolute: 1.9 x10E3/uL (ref 1.4–7.0)
Neutrophils: 37 %
Platelets: 228 x10E3/uL (ref 150–450)
RBC: 5.42 x10E6/uL (ref 4.14–5.80)
RDW: 15.9 % — ABNORMAL HIGH (ref 11.6–15.4)
WBC: 5.1 x10E3/uL (ref 3.4–10.8)

## 2023-07-30 MED ORDER — ATORVASTATIN CALCIUM 80 MG PO TABS
80.0000 mg | ORAL_TABLET | Freq: Every day | ORAL | 3 refills | Status: AC
  Filled 2023-07-30 – 2023-10-21 (×2): qty 90, 90d supply, fill #0
  Filled 2024-01-14: qty 90, 90d supply, fill #1

## 2023-07-30 NOTE — Progress Notes (Addendum)
 HPI: Bruce Cabrera is a 58 y.o. male who presents to the [facility] pharmacy clinic for Apretude  administration and HIV PrEP follow up.  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 FOR BLOOD PRESSURE   ATORVASTATIN  (LIPITOR) 80 MG TABLET    Take 1 tablet (80 mg total) by mouth daily.   AZATHIOPRINE  (IMURAN ) 50 MG TABLET    Take 1 tablet (50 mg total) by mouth daily.   AZATHIOPRINE  (IMURAN ) 50 MG TABLET    Take 1 tablet (50 mg total) by mouth daily.   BUPROPION  (WELLBUTRIN  XL) 150 MG 24 HR TABLET    Take 1 tablet (150 mg total) by mouth daily.   CABOTEGRAVIR  ER (APRETUDE ) 600 MG/3ML INJECTION    Inject 3 mLs (600 mg total) into the muscle every 2 (two) months.   CONTINUOUS GLUCOSE RECEIVER (DEXCOM G7 RECEIVER) DEVI    Use to check blood glucose continuously.   CONTINUOUS GLUCOSE SENSOR (DEXCOM G7 SENSOR) MISC    Use to check blood glucose throughout the day. Changes sensors once every 10 days.   DAPAGLIFLOZIN  PROPANEDIOL (FARXIGA ) 10 MG TABS TABLET    Take 1 tablet (10 mg total) by mouth daily before breakfast.   DORZOLAMIDE -TIMOLOL  (COSOPT ) 2-0.5 % OPHTHALMIC SOLUTION    Place 1 drop into both eyes 2 (two) times daily.   DORZOLAMIDE -TIMOLOL  (COSOPT ) 2-0.5 % OPHTHALMIC SOLUTION    Place 1 drop into both eyes 2 (two) times daily.   DORZOLAMIDE -TIMOLOL  (COSOPT ) 2-0.5 %  OPHTHALMIC SOLUTION    Place 1 drop into both eyes 2 (two) times daily.   DORZOLAMIDE -TIMOLOL  (COSOPT ) 2-0.5 % OPHTHALMIC SOLUTION    Place 1 drop into both eyes in the morning and at bedtime.   DOXYCYCLINE  (VIBRA -TABS) 100 MG TABLET    Take 2 tablets (200 mg total) by mouth See admin instructions. Within 72 hours of sexual contact   INSULIN  GLARGINE, 1 UNIT DIAL , (TOUJEO ) 300 UNIT/ML SOLOSTAR PEN    Inject 40 Units into the skin at bedtime.   INSULIN  PEN NEEDLE (TRUEPLUS 5-BEVEL PEN NEEDLES) 32G X 4 MM MISC    Use to inject Toujeo  once daily.   SEMAGLUTIDE , 2 MG/DOSE, 8 MG/3ML SOPN    Inject 2 mg as directed once a week.   VALSARTAN  (DIOVAN ) 40 MG TABLET    Take 1 tablet (40 mg total) by mouth daily.   VITAMIN D , ERGOCALCIFEROL , (DRISDOL ) 1.25 MG (50000 UNIT) CAPS CAPSULE    Take 1 capsule (50,000 Units total) by mouth once a week.  Modified Medications   No medications on file  Discontinued Medications   No medications on file    Allergies: Allergies  Allergen Reactions   Trulicity  [Dulaglutide ] Other (See Comments)    Abdominal pain    Labs: Lab Results  Component Value Date  HIV1RNAQUANT NOT DETECTED 06/03/2023    RPR and STI Lab Results  Component Value Date   LABRPR Reactive (A) 04/10/2023   LABRPR NON REAC 01/11/2008   LABRPR NON REAC 01/27/2007        No data to display          Hepatitis B Lab Results  Component Value Date   HEPBSAB NON-REACTIVE 05/01/2023   HEPBSAG NON-REACTIVE 05/01/2023   HEPBCAB NON-REACTIVE 05/01/2023   Hepatitis C Lab Results  Component Value Date   HEPCAB NON-REACTIVE 05/01/2023   Hepatitis A Lab Results  Component Value Date   HAV NON-REACTIVE 05/01/2023   Lipids: Lab Results  Component Value Date   CHOL 141 12/19/2022   TRIG 98 12/19/2022   HDL 37 (L) 12/19/2022   CHOLHDL 3.8 12/19/2022   VLDL 20 11/25/2013   LDLCALC 86 12/19/2022    TARGET DATE: 19th  Assessment: Bruce Cabrera presents today for his Apretude   injection and to follow up for HIV PrEP. No issues with past injections. Denies any symptoms of acute HIV. 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 We will plan to recheck his RPR titer in September or sooner if he endorses any new sexual partners.   No known exposures to any STIs since last visit. He wishes to defer STI screening today. He has tablets on hand at home for DoxyPEP.   Per [employer] guidelines, a rapid HIV test should be drawn prior to Apretude  administration. Due to state shortage of rapid HIV tests, this is temporarily unable to be done. Per decision from [facility] physicians, we will proceed with Apretude  administration at this time without a negative rapid HIV test beforehand. HIV RNA was collected today and is in process.  Administered cabotegravir  600mg /27mL in LEFT upper outer quadrant of the gluteal muscle. Will see him back in 2 months for injection, labs, and HIV PrEP follow up.  He is eligible for Shingrix 1/2 vaccine today but wishes to defer this for now. He will be due for HAV vaccine 2/2 in September. Will recheck HBV immunity after completing vaccine series.   Plan: - Apretude  injection administered - HIV RNA and HBV surface antibody - Next injection, labs, and PrEP follow up appointment scheduled for 2 months - Call with any issues or questions  Tolu Indyah Saulnier, PharmD [employer] PGY-1

## 2023-07-31 ENCOUNTER — Ambulatory Visit (INDEPENDENT_AMBULATORY_CARE_PROVIDER_SITE_OTHER): Admitting: Pharmacist

## 2023-07-31 ENCOUNTER — Other Ambulatory Visit: Payer: Self-pay

## 2023-07-31 DIAGNOSIS — Z2981 Encounter for HIV pre-exposure prophylaxis: Secondary | ICD-10-CM

## 2023-07-31 DIAGNOSIS — Z113 Encounter for screening for infections with a predominantly sexual mode of transmission: Secondary | ICD-10-CM

## 2023-07-31 MED ORDER — CABOTEGRAVIR ER 600 MG/3ML IM SUER
600.0000 mg | Freq: Once | INTRAMUSCULAR | Status: AC
  Administered 2023-07-31: 600 mg via INTRAMUSCULAR

## 2023-08-02 LAB — HEPATITIS B SURFACE ANTIBODY,QUALITATIVE: Hep B S Ab: REACTIVE — AB

## 2023-08-02 LAB — HIV-1 RNA QUANT-NO REFLEX-BLD
HIV 1 RNA Quant: NOT DETECTED {copies}/mL
HIV-1 RNA Quant, Log: NOT DETECTED {Log_copies}/mL

## 2023-08-06 ENCOUNTER — Other Ambulatory Visit: Payer: Self-pay

## 2023-08-06 ENCOUNTER — Other Ambulatory Visit (HOSPITAL_COMMUNITY): Payer: Self-pay

## 2023-08-18 ENCOUNTER — Other Ambulatory Visit: Payer: Self-pay

## 2023-08-19 ENCOUNTER — Other Ambulatory Visit: Payer: Self-pay

## 2023-08-27 ENCOUNTER — Other Ambulatory Visit: Payer: Self-pay

## 2023-08-27 ENCOUNTER — Other Ambulatory Visit: Payer: Self-pay | Admitting: Family Medicine

## 2023-08-27 DIAGNOSIS — E1165 Type 2 diabetes mellitus with hyperglycemia: Secondary | ICD-10-CM

## 2023-08-27 MED ORDER — DAPAGLIFLOZIN PROPANEDIOL 10 MG PO TABS
10.0000 mg | ORAL_TABLET | Freq: Every day | ORAL | 4 refills | Status: DC
  Filled 2023-08-27 – 2023-09-18 (×2): qty 30, 30d supply, fill #0
  Filled 2023-10-24: qty 30, 30d supply, fill #1
  Filled 2023-10-24: qty 30, 30d supply, fill #0
  Filled 2023-10-26 – 2023-11-18 (×3): qty 30, 30d supply, fill #1
  Filled 2023-12-18: qty 30, 30d supply, fill #2
  Filled 2024-01-18: qty 30, 30d supply, fill #3

## 2023-08-28 ENCOUNTER — Other Ambulatory Visit: Payer: Self-pay

## 2023-09-01 ENCOUNTER — Other Ambulatory Visit: Payer: Self-pay

## 2023-09-03 ENCOUNTER — Other Ambulatory Visit: Payer: Self-pay

## 2023-09-08 ENCOUNTER — Ambulatory Visit: Admitting: Podiatry

## 2023-09-19 ENCOUNTER — Other Ambulatory Visit: Payer: Self-pay

## 2023-09-22 ENCOUNTER — Other Ambulatory Visit: Payer: Self-pay

## 2023-09-22 ENCOUNTER — Other Ambulatory Visit (HOSPITAL_COMMUNITY): Payer: Self-pay

## 2023-09-22 NOTE — Progress Notes (Signed)
 Specialty Pharmacy Refill Coordination Note  Bruce Cabrera is a 58 y.o. male assessed today regarding refills of clinic administered specialty medication(s) Cabotegravir  (Apretude )   Clinic requested Courier to Provider Office   Delivery date: 09/25/23   Verified address: [address] [place] [address]   Medication will be filled on 09/24/23.

## 2023-09-24 ENCOUNTER — Other Ambulatory Visit: Payer: Self-pay

## 2023-09-25 ENCOUNTER — Telehealth: Payer: Self-pay

## 2023-09-25 NOTE — Telephone Encounter (Signed)
 [facility] Patient Advocate Encounter  Patient's medications APRETUDE  have been couriered to [facility] from [facility] and will be administered at the patients appointment on 09/30/23.  Charmaine Sharps, CPhT Specialty Pharmacy Patient [facility] Phone: [phone] Fax:  [phone]

## 2023-09-26 ENCOUNTER — Other Ambulatory Visit: Payer: Self-pay

## 2023-09-29 NOTE — Progress Notes (Signed)
 HPI: Bruce Cabrera is a 58 y.o. male who presents to the [facility] pharmacy clinic for Apretude  administration and HIV PrEP follow up.  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 FOR BLOOD PRESSURE   ATORVASTATIN  (LIPITOR) 80 MG TABLET    Take 1 tablet (80 mg total) by mouth daily.   AZATHIOPRINE  (IMURAN ) 50 MG TABLET    Take 1 tablet (50 mg total) by mouth daily.   AZATHIOPRINE  (IMURAN ) 50 MG TABLET    Take 1 tablet (50 mg total) by mouth daily.   BUPROPION  (WELLBUTRIN  XL) 150 MG 24 HR TABLET    Take 1 tablet (150 mg total) by mouth daily.   CABOTEGRAVIR  ER (APRETUDE ) 600 MG/3ML INJECTION    Inject 3 mLs (600 mg total) into the muscle every 2 (two) months.   CONTINUOUS GLUCOSE RECEIVER (DEXCOM G7 RECEIVER) DEVI    Use to check blood glucose continuously.   CONTINUOUS GLUCOSE SENSOR (DEXCOM G7 SENSOR) MISC    Use to check blood glucose throughout the day. Changes sensors once every 10 days.   DAPAGLIFLOZIN  PROPANEDIOL (FARXIGA ) 10 MG TABS TABLET    Take 1 tablet (10 mg total) by mouth daily before breakfast.   DORZOLAMIDE -TIMOLOL  (COSOPT ) 2-0.5 % OPHTHALMIC SOLUTION    Place 1 drop into both eyes 2 (two) times daily.   DORZOLAMIDE -TIMOLOL  (COSOPT ) 2-0.5 % OPHTHALMIC SOLUTION    Place 1 drop into both eyes 2 (two) times daily.   DORZOLAMIDE -TIMOLOL  (COSOPT ) 2-0.5 %  OPHTHALMIC SOLUTION    Place 1 drop into both eyes 2 (two) times daily.   DORZOLAMIDE -TIMOLOL  (COSOPT ) 2-0.5 % OPHTHALMIC SOLUTION    Place 1 drop into both eyes in the morning and at bedtime.   DOXYCYCLINE  (VIBRA -TABS) 100 MG TABLET    Take 2 tablets (200 mg total) by mouth See admin instructions. Within 72 hours of sexual contact   INSULIN  GLARGINE, 1 UNIT DIAL , (TOUJEO ) 300 UNIT/ML SOLOSTAR PEN    Inject 40 Units into the skin at bedtime.   INSULIN  PEN NEEDLE (TRUEPLUS 5-BEVEL PEN NEEDLES) 32G X 4 MM MISC    Use to inject Toujeo  once daily.   SEMAGLUTIDE , 2 MG/DOSE, 8 MG/3ML SOPN    Inject 2 mg as directed once a week.   VALSARTAN  (DIOVAN ) 40 MG TABLET    Take 1 tablet (40 mg total) by mouth daily.   VITAMIN D , ERGOCALCIFEROL , (DRISDOL ) 1.25 MG (50000 UNIT) CAPS CAPSULE    Take 1 capsule (50,000 Units total) by mouth once a week.  Modified Medications   No medications on file  Discontinued Medications   No medications on file    Allergies: Allergies  Allergen Reactions   Trulicity  [Dulaglutide ] Other (See Comments)    Abdominal pain    Labs: Lab Results  Component Value Date  HIV1RNAQUANT NOT DETECTED 07/31/2023   HIV1RNAQUANT NOT DETECTED 06/03/2023    RPR and STI Lab Results  Component Value Date   LABRPR Reactive (A) 04/10/2023   LABRPR NON REAC 01/11/2008   LABRPR NON REAC 01/27/2007        No data to display          Hepatitis B Lab Results  Component Value Date   HEPBSAB REACTIVE (A) 07/31/2023   HEPBSAG NON-REACTIVE 05/01/2023   HEPBCAB NON-REACTIVE 05/01/2023   Hepatitis C Lab Results  Component Value Date   HEPCAB NON-REACTIVE 05/01/2023   Hepatitis A Lab Results  Component Value Date   HAV NON-REACTIVE 05/01/2023   Lipids: Lab Results  Component Value Date   CHOL 141 12/19/2022   TRIG 98 12/19/2022   HDL 37 (L) 12/19/2022   CHOLHDL 3.8 12/19/2022   VLDL 20 11/25/2013   LDLCALC 86 12/19/2022    TARGET DATE: 19th  Assessment: Bruce Cabrera  presents today for his maintenance Apretude  injection and to follow up for HIV PrEP. No issues with past injections. Denies any symptoms of acute HIV. Last HIV RNA was negative on 07/31/23.  Last STI screening was on 04/15/23 and was positive for T.pallidum. His RPR was positive 1:32. We will recheck RPR in September. No known exposures to any STIs since last visit. Defers STI testing today as he is not active at this time. He would be eligible for shingles vaccine, but we did not discuss this today. We will follow up next appointment.   Per [employer] guidelines, a rapid HIV test should be drawn prior to Apretude  administration. Due to state shortage of rapid HIV tests, this is temporarily unable to be done. Per decision from [facility] physicians, we will proceed with Apretude  administration at this time without a negative rapid HIV test beforehand. HIV RNA was collected today and is in process.  Administered cabotegravir  600mg /67mL in right upper outer quadrant of the gluteal muscle. Will see [name] back in 2 months for injection, labs, and HIV PrEP follow up.  Plan: - Apretude  injection administered - HIV RNA today - Next injection, labs, and PrEP follow up appointment scheduled for 12/05/23 - Call with any issues or questions  Elma Fail, PharmD PGY1 Clinical Pharmacist [facility]  09/30/2023 9:33 AM

## 2023-09-30 ENCOUNTER — Ambulatory Visit (INDEPENDENT_AMBULATORY_CARE_PROVIDER_SITE_OTHER): Payer: Self-pay | Admitting: Pharmacist

## 2023-09-30 ENCOUNTER — Other Ambulatory Visit: Payer: Self-pay

## 2023-09-30 DIAGNOSIS — Z2981 Encounter for HIV pre-exposure prophylaxis: Secondary | ICD-10-CM | POA: Diagnosis not present

## 2023-09-30 DIAGNOSIS — Z113 Encounter for screening for infections with a predominantly sexual mode of transmission: Secondary | ICD-10-CM

## 2023-09-30 MED ORDER — CABOTEGRAVIR ER 600 MG/3ML IM SUER
600.0000 mg | Freq: Once | INTRAMUSCULAR | Status: AC
  Administered 2023-09-30 (×2): 600 mg via INTRAMUSCULAR

## 2023-10-02 ENCOUNTER — Other Ambulatory Visit: Payer: Self-pay

## 2023-10-02 LAB — HIV-1 RNA QUANT-NO REFLEX-BLD
HIV 1 RNA Quant: NOT DETECTED {copies}/mL
HIV-1 RNA Quant, Log: NOT DETECTED {Log_copies}/mL

## 2023-10-03 ENCOUNTER — Other Ambulatory Visit: Payer: Self-pay

## 2023-10-03 ENCOUNTER — Other Ambulatory Visit: Payer: Self-pay | Admitting: Family Medicine

## 2023-10-03 ENCOUNTER — Other Ambulatory Visit (HOSPITAL_COMMUNITY): Payer: Self-pay

## 2023-10-03 MED ORDER — DEXCOM G7 RECEIVER DEVI
0 refills | Status: DC
  Filled 2023-10-03 – 2023-10-26 (×2): qty 1, 90d supply, fill #0

## 2023-10-04 ENCOUNTER — Other Ambulatory Visit (HOSPITAL_COMMUNITY): Payer: Self-pay

## 2023-10-06 ENCOUNTER — Encounter: Payer: Self-pay | Admitting: Podiatry

## 2023-10-06 ENCOUNTER — Other Ambulatory Visit (HOSPITAL_COMMUNITY): Payer: Self-pay

## 2023-10-06 ENCOUNTER — Telehealth: Payer: Self-pay | Admitting: Podiatry

## 2023-10-06 ENCOUNTER — Other Ambulatory Visit: Payer: Self-pay

## 2023-10-06 ENCOUNTER — Ambulatory Visit (INDEPENDENT_AMBULATORY_CARE_PROVIDER_SITE_OTHER): Admitting: Podiatry

## 2023-10-06 ENCOUNTER — Ambulatory Visit (INDEPENDENT_AMBULATORY_CARE_PROVIDER_SITE_OTHER)

## 2023-10-06 DIAGNOSIS — S92354A Nondisplaced fracture of fifth metatarsal bone, right foot, initial encounter for closed fracture: Secondary | ICD-10-CM

## 2023-10-06 DIAGNOSIS — S9031XA Contusion of right foot, initial encounter: Secondary | ICD-10-CM

## 2023-10-06 NOTE — Progress Notes (Signed)
 Chief Complaint  Patient presents with   Foot Pain    Right foot injury on Friday, needs callouses trimmed    Subjective:  Patient presents today status post fibular sesamoidectomy of the right foot.  DOS: 08/29/2022.  Patient continues to have recurrence of the symptomatic skin lesions to the bilateral feet.  Presenting for further treatment and evaluation  Patient also has a new complaint today.  He states that while he was working this past Friday, 10/03/2023, he stepped down on his right foot and noticed a 'pop'  With immediate pain.  It has been painful over the weekend.  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) 05/09/2022    Past Surgical History:  Procedure Laterality Date   COLONOSCOPY     COLONOSCOPY W/ POLYPECTOMY  03/21/2023   FOOT SURGERY Left    LEFT HEART CATHETERIZATION WITH CORONARY ANGIOGRAM N/A 11/29/2013   Procedure: LEFT HEART CATHETERIZATION WITH CORONARY ANGIOGRAM;  Surgeon: Debby DELENA Sor, MD;  Location: [facility] C[facility] LAB;  Service: Cardiovascular;  Laterality: N/A;    Allergies  Allergen Reactions   Trulicity  [Dulaglutide ] Other (See Comments)    Abdominal pain    LT foot 05/19/2023  RT foot 05/19/2023  Objective/Physical Exam Preulcerative callus lesions noted to the lateral aspect of the left foot around the fifth metatarsal tubercle as well as the plantar aspect of the first MTP right foot.  There is also tenderness with mild edema noted around the fifth metatarsal tubercle of the right foot  Radiographic Exam RT foot 10/06/2023:  Absence of the  fibular sesamoid noted.  Transverse fracture noted at the fifth metatarsal tubercle with minimal displacement.  Impression: Acute fracture fifth metatarsal tubercle left foot  MR TOES RIGHT WO CONTRAST 01/11/2023 IMPRESSION: 1. Mild flexor hallucis longus tenosynovitis both proximal to and distal to the first MTP joint. 2. Mildly thickened and indistinct adductor hallucis tendon, compatible with tendinopathy 3. Absent lateral sesamoid of the first digit. Bifid medial sesamoid of the first digit without internal edema signal. 4. Small dorsal effusion of the first MTP joint. 5. Mild degenerative findings along the interphalangeal joint of the great toe.  Assessment: 1. s/p sesamoidectomy right. DOS: 08/29/2022 2.  Preulcerative callus lesions first MTP right and fifth metatarsal tubercle left 3.  Acute fracture fifth metatarsal tubercle right.  DOI: 10/03/2023  Plan of Care:  -Patient was evaluated.  - Excisional debridement of the hyperkeratotic preulcerative callus lesions was performed today using a 312 scalpel without incident or bleeding. -In regards to the acute fracture of the fifth metatarsal tubercle, recommend immobilization cam boot.  He has a cam boot at home -Also recommend nonweightbearing or minimal weightbearing in the cam boot.   -Unfortunately he works on his feet for the entire shift.  Recommend refraining from work x 6 weeks to allow the fracture to heal.  Patient will contact his HR department and provider in office with the paperwork and forms necessary -Anticipated return to work date 11/24/2023. -Return to clinic with me 6 weeks for follow-up x-ray  *Works at the [employer]  Thresa EMERSON Sar, DPM [facility]  Dr. Thresa EMERSON Sar, DPM    [address] [place], [place] [address]                Office [phone]  Fax [phone]

## 2023-10-06 NOTE — Telephone Encounter (Signed)
 Patient called and would like to speak with a nurse regarding when he is supposed to be wearing his boot at home. He would also like a prescription for pain medicine sent to the [facility] pharmacy.

## 2023-10-07 DIAGNOSIS — Z0271 Encounter for disability determination: Secondary | ICD-10-CM

## 2023-10-07 NOTE — Telephone Encounter (Signed)
 Recd forms from [employer]. Faxed [phone] forms and notes approx RTW 11/24/23

## 2023-10-08 ENCOUNTER — Telehealth: Payer: Self-pay

## 2023-10-08 ENCOUNTER — Other Ambulatory Visit: Payer: Self-pay

## 2023-10-08 NOTE — Telephone Encounter (Signed)
 Patient called and left a message, asking for pain medicine. thanks

## 2023-10-09 NOTE — Telephone Encounter (Signed)
 Patient called again today asking for pain medicine prescription

## 2023-10-10 ENCOUNTER — Other Ambulatory Visit: Payer: Self-pay

## 2023-10-10 ENCOUNTER — Encounter: Payer: Self-pay | Admitting: Oncology

## 2023-10-10 ENCOUNTER — Other Ambulatory Visit: Payer: Self-pay | Admitting: Podiatry

## 2023-10-10 MED ORDER — HYDROCODONE-ACETAMINOPHEN 10-325 MG PO TABS
1.0000 | ORAL_TABLET | ORAL | 0 refills | Status: DC | PRN
  Filled 2023-10-10: qty 30, 6d supply, fill #0

## 2023-10-10 NOTE — Progress Notes (Signed)
 PRN 5th metatarsal fracture pain  Thresa EMERSON Sar, DPM [facility]  Dr. Thresa EMERSON Sar, DPM    [address] [place], [place] [address]                Office [phone]  Fax [phone]

## 2023-10-21 ENCOUNTER — Other Ambulatory Visit: Payer: Self-pay

## 2023-10-21 ENCOUNTER — Encounter: Payer: Self-pay | Admitting: Oncology

## 2023-10-24 ENCOUNTER — Other Ambulatory Visit: Payer: Self-pay

## 2023-10-24 ENCOUNTER — Other Ambulatory Visit (HOSPITAL_COMMUNITY): Payer: Self-pay

## 2023-10-26 ENCOUNTER — Other Ambulatory Visit (HOSPITAL_COMMUNITY): Payer: Self-pay

## 2023-10-27 ENCOUNTER — Other Ambulatory Visit: Payer: Self-pay

## 2023-10-28 ENCOUNTER — Other Ambulatory Visit: Payer: Self-pay | Admitting: Family Medicine

## 2023-10-28 ENCOUNTER — Other Ambulatory Visit: Payer: Self-pay

## 2023-10-28 ENCOUNTER — Telehealth: Payer: Self-pay | Admitting: Nurse Practitioner

## 2023-10-28 DIAGNOSIS — Z794 Long term (current) use of insulin: Secondary | ICD-10-CM

## 2023-10-28 NOTE — Telephone Encounter (Signed)
Pt confirmed appt 9/10

## 2023-10-29 ENCOUNTER — Encounter: Payer: Self-pay | Admitting: Nurse Practitioner

## 2023-10-29 ENCOUNTER — Ambulatory Visit: Attending: Nurse Practitioner | Admitting: Nurse Practitioner

## 2023-10-29 VITALS — BP 149/86 | HR 73 | Temp 97.6°F | Resp 19 | Ht 75.0 in | Wt 278.6 lb

## 2023-10-29 DIAGNOSIS — Z1211 Encounter for screening for malignant neoplasm of colon: Secondary | ICD-10-CM | POA: Diagnosis not present

## 2023-10-29 DIAGNOSIS — I10 Essential (primary) hypertension: Secondary | ICD-10-CM

## 2023-10-29 NOTE — Progress Notes (Unsigned)
 Assessment & Plan:  Bruce Cabrera was seen today for hypertension.  Diagnoses and all orders for this visit:  Primary hypertension DOSE CHANGE -     amLODipine  (NORVASC ) 10 MG tablet; Take 1 tablet (10 mg total) by mouth daily. FOR BLOOD PRESSURE Hypertension remains elevated. Wrist cuff readings inaccurate due to improper use. - Educated on proper wrist cuff use. INCREASED AMLODIPINE  to 10 mg, monitor for peripheral edema. - If edema occurs, reduce amlodipine  to 5 mg, consider increasing valsartan , monitor kidney function. - Hold off on refilling blood pressure medications until dosage adjustment decision is made.  Colon cancer screening -     Cancel: Cologuard   Type 2 diabetes mellitus without complications Blood glucose levels well-controlled. Current A1c stable.  Patient has been counseled on age-appropriate routine health concerns for screening and prevention. These are reviewed and up-to-date. Referrals have been placed accordingly. Immunizations are up-to-date or declined.    Subjective:   Chief Complaint  Patient presents with   Hypertension    History of Present Illness Bruce Cabrera is a 58 year old male with hypertension and diabetes who presents for blood pressure management.  HTN He has been experiencing elevated blood pressure readings at home. He is currently on valsartan  40 mg and amlodipine  5mg .  He uses a wrist blood pressure cuff but is unsure of its accuracy. No symptoms such as headaches despite high readings from the wrist cuff. I had him simulate a blood pressure check with his wrist monitor today and he has been using the monitor on the opposite side of the wrist.  BP Readings from Last 3 Encounters:  10/29/23 (!) 149/86  07/29/23 (!) 153/83  04/28/23 127/78     DM 2 He has a history of diabetes, which he is managing well. His blood sugar levels have been stable, with recent readings of 99, 133, and 112. He uses a continuous glucose monitor to track his  levels. He mentioned a temporary increase in blood sugar after consuming two doughnuts, but otherwise, his levels have been controlled. He has been less active due to wearing a boot for a foot issue, impacting his ability to exercise. Currently prescribed farxiga  10 mg daily and ozempic  2 mg weekly Lab Results  Component Value Date   HGBA1C 6.6 07/29/2023      He is not currently receiving a flu vaccine and has declined it during this visit.  Review of Systems  Constitutional:  Negative for fever, malaise/fatigue and weight loss.  HENT: Negative.  Negative for nosebleeds.   Eyes: Negative.  Negative for blurred vision, double vision and photophobia.  Respiratory: Negative.  Negative for cough and shortness of breath.   Cardiovascular: Negative.  Negative for chest pain, palpitations and leg swelling.  Gastrointestinal: Negative.  Negative for heartburn, nausea and vomiting.  Musculoskeletal: Negative.  Negative for myalgias.  Neurological: Negative.  Negative for dizziness, focal weakness, seizures and headaches.  Psychiatric/Behavioral: Negative.  Negative for suicidal ideas.     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) 05/09/2022    Past Surgical History:  Procedure Laterality Date   COLONOSCOPY     COLONOSCOPY W/ POLYPECTOMY  03/21/2023   FOOT SURGERY Left    LEFT HEART CATHETERIZATION WITH CORONARY ANGIOGRAM N/A 11/29/2013   Procedure: LEFT HEART CATHETERIZATION WITH CORONARY ANGIOGRAM;  Surgeon: Debby DELENA Sor, MD;  Location: [facility] CATH LAB;  Service: Cardiovascular;   Laterality: N/A;    Family History  Problem Relation Age of Onset   Heart attack Mother 41   Colon polyps Sister    Colon cancer Sister    Stroke Sister    Heart failure Brother 40   Rectal cancer Neg Hx    Stomach cancer Neg Hx    Esophageal cancer Neg Hx     Social History Reviewed with no changes to be made today.   Outpatient Medications Prior to Visit  Medication Sig Dispense Refill   atorvastatin  (LIPITOR) 80 MG tablet Take 1 tablet (80 mg total) by mouth daily. 90 tablet 3   azaTHIOprine  (IMURAN ) 50 MG tablet Take 1 tablet (50 mg total) by mouth daily. 90 tablet 3   azaTHIOprine  (IMURAN ) 50 MG tablet Take 1 tablet (50 mg total) by mouth daily. 90 tablet 3   buPROPion  (WELLBUTRIN  XL) 150 MG 24 hr tablet Take 1 tablet (150 mg total) by mouth daily. 60 tablet 2   cabotegravir  ER (APRETUDE ) 600 MG/3ML injection Inject 3 mLs (600 mg total) into the muscle every 2 (two) months. 3 mL 5   Continuous Glucose Receiver (DEXCOM G7 RECEIVER) DEVI Use to check blood glucose continuously. 1 each 0   Continuous Glucose Sensor (DEXCOM G7 SENSOR) MISC Use to check blood glucose throughout the day. Changes sensors once every 10 days. 3 each 6   dapagliflozin  propanediol (FARXIGA ) 10 MG TABS tablet Take 1 tablet (10 mg total) by mouth daily before breakfast. 30 tablet 4   dorzolamide -timolol  (COSOPT ) 2-0.5 % ophthalmic solution Place 1 drop into both eyes 2 (two) times daily. 10 mL 11   dorzolamide -timolol  (COSOPT ) 2-0.5 % ophthalmic solution Place 1 drop into both eyes 2 (two) times daily. 10 mL 11   dorzolamide -timolol  (COSOPT ) 2-0.5 % ophthalmic solution Place 1 drop into both eyes 2 (two) times daily. 10 mL 11   dorzolamide -timolol  (COSOPT ) 2-0.5 % ophthalmic solution Place 1 drop into both eyes in the morning and at bedtime. 10 mL 11   doxycycline  (VIBRA -TABS) 100 MG tablet Take 2 tablets (200 mg total) by mouth See admin instructions. Within 72 hours of sexual contact 20 tablet 0   insulin   glargine, 1 Unit Dial , (TOUJEO ) 300 UNIT/ML Solostar Pen Inject 40 Units into the skin at bedtime. 4.5 mL 4   Insulin  Pen Needle (TRUEPLUS 5-BEVEL PEN NEEDLES) 32G X 4 MM MISC Use to inject Toujeo  once daily. 100 each 2   valsartan  (DIOVAN ) 40 MG tablet Take 1 tablet (40 mg total) by mouth daily. 90 tablet 3   Vitamin D , Ergocalciferol , (DRISDOL ) 1.25 MG (50000 UNIT) CAPS capsule Take 1 capsule (50,000 Units total) by mouth once a week. 12 capsule 0   amLODipine  (NORVASC ) 5 MG tablet Take 1 tablet (5 mg total) by mouth daily. FOR BLOOD PRESSURE 90 tablet 1   Semaglutide , 2 MG/DOSE, 8 MG/3ML SOPN Inject 2 mg as directed once a week. 3 mL 3   No facility-administered medications prior to visit.    Allergies  Allergen Reactions   Trulicity  [Dulaglutide ] Other (See Comments)    Abdominal pain       Objective:    BP (!) 149/86 (  BP Location: Left Arm, Patient Position: Sitting, Cuff Size: Normal)   Pulse 73   Temp 97.6 F (36.4 C) (Oral)   Resp 19   Ht 6' 3 (1.905 m)   Wt 278 lb 9.6 oz (126.4 kg)   SpO2 98%   BMI 34.82 kg/m  Wt Readings from Last 3 Encounters:  10/29/23 278 lb 9.6 oz (126.4 kg)  07/29/23 279 lb 3.2 oz (126.6 kg)  04/28/23 259 lb 12.8 oz (117.8 kg)    Physical Exam Vitals and nursing note reviewed.  Constitutional:      Appearance: He is well-developed.  HENT:     Head: Normocephalic and atraumatic.  Cardiovascular:     Rate and Rhythm: Normal rate and regular rhythm.     Heart sounds: Normal heart sounds. No murmur heard.    No friction rub. No gallop.  Pulmonary:     Effort: Pulmonary effort is normal. No tachypnea or respiratory distress.     Breath sounds: Normal breath sounds. No decreased breath sounds, wheezing, rhonchi or rales.  Chest:     Chest wall: No tenderness.  Musculoskeletal:        General: Normal range of motion.     Cervical back: Normal range of motion.  Skin:    General: Skin is warm and dry.  Neurological:     Mental Status: He  is alert and oriented to person, place, and time.     Coordination: Coordination normal.  Psychiatric:        Behavior: Behavior normal. Behavior is cooperative.        Thought Content: Thought content normal.        Judgment: Judgment normal.          Patient has been counseled extensively about nutrition and exercise as well as the importance of adherence with medications and regular follow-up. The patient was given clear instructions to go to ER or return to medical center if symptoms don't improve, worsen or new problems develop. The patient verbalized understanding.   Follow-up: Return in about 3 months (around 01/28/2024).   Haze LELON Servant, FNP-BC [facility] [place], [place] [phone]   10/30/2023, 1:33 PM

## 2023-10-30 ENCOUNTER — Telehealth: Payer: Self-pay | Admitting: Nurse Practitioner

## 2023-10-30 ENCOUNTER — Other Ambulatory Visit: Payer: Self-pay

## 2023-10-30 ENCOUNTER — Other Ambulatory Visit: Payer: Self-pay | Admitting: Nurse Practitioner

## 2023-10-30 ENCOUNTER — Encounter: Payer: Self-pay | Admitting: Nurse Practitioner

## 2023-10-30 DIAGNOSIS — I10 Essential (primary) hypertension: Secondary | ICD-10-CM

## 2023-10-30 MED ORDER — AMLODIPINE BESYLATE 10 MG PO TABS
10.0000 mg | ORAL_TABLET | Freq: Every day | ORAL | 1 refills | Status: AC
  Filled 2023-10-30: qty 90, 90d supply, fill #0
  Filled 2024-01-23: qty 90, 90d supply, fill #1

## 2023-10-30 MED ORDER — OZEMPIC (2 MG/DOSE) 8 MG/3ML ~~LOC~~ SOPN
2.0000 mg | PEN_INJECTOR | SUBCUTANEOUS | 3 refills | Status: AC
  Filled 2023-10-30 – 2023-12-01 (×2): qty 3, 28d supply, fill #0
  Filled 2023-12-29: qty 3, 28d supply, fill #1
  Filled 2024-02-04 (×4): qty 3, 28d supply, fill #2
  Filled 2024-02-27: qty 3, 28d supply, fill #3

## 2023-10-30 NOTE — Telephone Encounter (Signed)
 I made him aware at his appointment and wrote it on his AVS.

## 2023-10-30 NOTE — Telephone Encounter (Signed)
 THANK YOU

## 2023-10-30 NOTE — Telephone Encounter (Signed)
 Requested Prescriptions  Pending Prescriptions Disp Refills   Semaglutide , 2 MG/DOSE, (OZEMPIC , 2 MG/DOSE,) 8 MG/3ML SOPN 3 mL 3    Sig: Inject 2 mg as directed once a week.     Endocrinology:  Diabetes - GLP-1 Receptor Agonists - semaglutide  Passed - 10/30/2023 10:25 AM      Passed - HBA1C in normal range and within 180 days    HbA1c, POC (controlled diabetic range)  Date Value Ref Range Status  07/29/2023 6.6 0.0 - 7.0 % Final         Passed - Cr in normal range and within 360 days    Creatinine  Date Value Ref Range Status  12/26/2015 0.7 0.7 - 1.3 mg/dL Final   Creatinine, Ser  Date Value Ref Range Status  07/29/2023 1.19 0.76 - 1.27 mg/dL Final         Passed - Valid encounter within last 6 months    Recent Outpatient Visits           Yesterday Primary hypertension   [facility] [place]. [facility] [place], [place] W, NP   3 months ago Diabetes mellitus treated with injections of non-insulin  medication [facility])   [facility] [place]. [facility] The[facility]is [place] W, NP   6 months ago Diabetes mellitus treated with insulin  and oral medication [facility]   [facility] [place]. [facility] [place], [place] W, NP   7 months ago Type 2 diabetes mellitus with hyperglycemia, unspecified whether long term insulin  use [facility])   [facility] [place]. [facility] [place] Tonia Garnette LITTIE, RPH-CPP   9 months ago Essential hypertension   [facility] Of . [facility] [place] Tonia Garnette LITTIE, RPH-CPP

## 2023-11-04 ENCOUNTER — Other Ambulatory Visit: Payer: Self-pay

## 2023-11-04 DIAGNOSIS — H30033 Focal chorioretinal inflammation, peripheral, bilateral: Secondary | ICD-10-CM | POA: Diagnosis not present

## 2023-11-04 DIAGNOSIS — A523 Neurosyphilis, unspecified: Secondary | ICD-10-CM | POA: Diagnosis not present

## 2023-11-04 DIAGNOSIS — E119 Type 2 diabetes mellitus without complications: Secondary | ICD-10-CM | POA: Diagnosis not present

## 2023-11-04 DIAGNOSIS — H3581 Retinal edema: Secondary | ICD-10-CM | POA: Diagnosis not present

## 2023-11-04 MED ORDER — DORZOLAMIDE HCL-TIMOLOL MAL 2-0.5 % OP SOLN
1.0000 [drp] | Freq: Two times a day (BID) | OPHTHALMIC | 11 refills | Status: AC
Start: 2023-11-04 — End: ?
  Filled 2023-11-04: qty 10, 100d supply, fill #0

## 2023-11-04 MED ORDER — AZATHIOPRINE 50 MG PO TABS
100.0000 mg | ORAL_TABLET | Freq: Every day | ORAL | 3 refills | Status: DC
  Filled 2023-11-04 – 2024-01-14 (×3): qty 180, 90d supply, fill #0

## 2023-11-04 MED ORDER — LATANOPROST 0.005 % OP SOLN
1.0000 [drp] | Freq: Every evening | OPHTHALMIC | 11 refills | Status: AC
  Filled 2023-11-04: qty 7.5, 150d supply, fill #0
  Filled 2023-11-11: qty 2.5, 50d supply, fill #0
  Filled 2023-12-17: qty 2.5, 25d supply, fill #0
  Filled 2024-01-14: qty 2.5, 25d supply, fill #1
  Filled 2024-01-19 – 2024-02-15 (×2): qty 2.5, 25d supply, fill #2

## 2023-11-10 ENCOUNTER — Other Ambulatory Visit: Payer: Self-pay | Admitting: Podiatry

## 2023-11-10 ENCOUNTER — Telehealth: Payer: Self-pay | Admitting: Lab

## 2023-11-10 MED ORDER — HYDROCODONE-ACETAMINOPHEN 10-325 MG PO TABS
1.0000 | ORAL_TABLET | ORAL | 0 refills | Status: AC | PRN
  Filled 2023-11-10: qty 30, 5d supply, fill #0

## 2023-11-10 NOTE — Telephone Encounter (Signed)
 Patient is requesting pain medication refill is in pain and he is out of medication.

## 2023-11-10 NOTE — Telephone Encounter (Signed)
 Patient was last prescribed Norco 10-325 po q4h prn on 10/10/2023 for foot fracture. Patient reports pain level 8/10 when off the foot and 9/10 when having to be on it. Denies swelling. Still wearing the cam boot, utilizing elevation but no ice, has tried otc ibuprofen and Tylenol  with little to no relief. He reports taking 4 doses of melatonin to just sleep through the pain. Most recent Norco taken was last Thursday and this is when he ran out. Pharmacy is [facility] on [address]. Please authorize refill/new RX if appropriate and thank you.

## 2023-11-11 ENCOUNTER — Encounter: Payer: Self-pay | Admitting: Oncology

## 2023-11-11 ENCOUNTER — Other Ambulatory Visit: Payer: Self-pay

## 2023-11-17 ENCOUNTER — Ambulatory Visit (INDEPENDENT_AMBULATORY_CARE_PROVIDER_SITE_OTHER): Admitting: Podiatry

## 2023-11-17 ENCOUNTER — Encounter: Payer: Self-pay | Admitting: Podiatry

## 2023-11-17 ENCOUNTER — Ambulatory Visit (INDEPENDENT_AMBULATORY_CARE_PROVIDER_SITE_OTHER)

## 2023-11-17 DIAGNOSIS — S92354D Nondisplaced fracture of fifth metatarsal bone, right foot, subsequent encounter for fracture with routine healing: Secondary | ICD-10-CM

## 2023-11-17 DIAGNOSIS — H30033 Focal chorioretinal inflammation, peripheral, bilateral: Secondary | ICD-10-CM | POA: Diagnosis not present

## 2023-11-17 DIAGNOSIS — Z79899 Other long term (current) drug therapy: Secondary | ICD-10-CM | POA: Diagnosis not present

## 2023-11-18 ENCOUNTER — Other Ambulatory Visit: Payer: Self-pay

## 2023-11-18 ENCOUNTER — Other Ambulatory Visit (HOSPITAL_COMMUNITY): Payer: Self-pay

## 2023-11-21 ENCOUNTER — Other Ambulatory Visit (HOSPITAL_COMMUNITY): Payer: Self-pay

## 2023-11-21 ENCOUNTER — Other Ambulatory Visit: Payer: Self-pay

## 2023-11-21 NOTE — Progress Notes (Signed)
 Specialty Pharmacy Refill Coordination Note  Bruce Cabrera is a 58 y.o. male assessed today regarding refills of clinic administered specialty medication(s) Cabotegravir  (Apretude )   Clinic requested Courier to Provider Office   Delivery date: 12/01/23   Verified address: [address] [place] [address]   Medication will be filled on 11/28/23.

## 2023-11-21 NOTE — Progress Notes (Signed)
 Chief Complaint  Patient presents with   Fracture    Follow up 5th met fracture right   Its feeling a lot better, ready to go back to work    Subjective:  Patient presents today status post fibular sesamoidectomy of the right foot.  DOS: 08/29/2022.  Patient continues to have recurrence of the symptomatic skin lesions to the bilateral feet.  Presenting for further treatment and evaluation  Patient also has a new complaint today.  He states that while he was working this past Friday, 10/03/2023, he stepped down on his right foot and noticed a 'pop'  With immediate pain.  It has been painful over the weekend.  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) 05/09/2022    Past Surgical History:  Procedure Laterality Date   COLONOSCOPY     COLONOSCOPY W/ POLYPECTOMY  03/21/2023   FOOT SURGERY Left    LEFT HEART CATHETERIZATION WITH CORONARY ANGIOGRAM N/A 11/29/2013   Procedure: LEFT HEART CATHETERIZATION WITH CORONARY ANGIOGRAM;  Surgeon: Debby DELENA Sor, MD;  Location: [facility] C[facility] LAB;  Service: Cardiovascular;  Laterality: N/A;    Allergies  Allergen Reactions   Trulicity  [Dulaglutide ] Other (See Comments)    Abdominal pain    LT foot 05/19/2023  RT foot 05/19/2023  Objective/Physical Exam Preulcerative callus lesions noted to the lateral aspect of the left foot around the fifth metatarsal tubercle as well as the plantar aspect of the first MTP right foot.  There is also tenderness with mild edema noted around the fifth metatarsal tubercle of the right foot  Radiographic Exam RT foot  11/17/2023:  Absence of the fibular sesamoid noted.  Transverse fracture noted at the fifth metatarsal tubercle with minimal displacement and routine healing noted.  Impression: fracture fifth metatarsal tubercle left foot  MR TOES RIGHT WO CONTRAST 01/11/2023 IMPRESSION: 1. Mild flexor hallucis longus tenosynovitis both proximal to and distal to the first MTP joint. 2. Mildly thickened and indistinct adductor hallucis tendon, compatible with tendinopathy 3. Absent lateral sesamoid of the first digit. Bifid medial sesamoid of the first digit without internal edema signal. 4. Small dorsal effusion of the first MTP joint. 5. Mild degenerative findings along the interphalangeal joint of the great toe.  Assessment: 1. s/p sesamoidectomy right. DOS: 08/29/2022 2.  Preulcerative callus lesions first MTP right and fifth metatarsal tubercle left 3.  Acute fracture fifth metatarsal tubercle right.  DOI: 10/03/2023  Plan of Care:  -Patient was evaluated.  - Excisional debridement of the hyperkeratotic preulcerative callus lesions was performed today using a 312 scalpel without incident or bleeding. -In regards to the acute fracture of the fifth metatarsal tubercle, patient is doing very well.  Significant improvement and there is no tenderness to palpation. - Okay to begin to slowly transition out of the cam boot into good supportive tennis shoes and sneakers.  Advised against going barefoot -Anticipated return to work date 11/24/2023. -Return to clinic with me 6 weeks for follow-up x-ray  *Works at the [employer]  Thresa EMERSON Sar, DPM [facility]  Dr. Thresa HERO.  Janit, DPM    [address] [place], [place] [address]                Office [phone]  Fax [phone]

## 2023-11-24 ENCOUNTER — Other Ambulatory Visit: Payer: Self-pay

## 2023-11-25 ENCOUNTER — Other Ambulatory Visit: Payer: Self-pay

## 2023-11-28 ENCOUNTER — Other Ambulatory Visit: Payer: Self-pay

## 2023-12-01 ENCOUNTER — Other Ambulatory Visit: Payer: Self-pay

## 2023-12-01 ENCOUNTER — Other Ambulatory Visit: Payer: Self-pay | Admitting: Critical Care Medicine

## 2023-12-01 ENCOUNTER — Telehealth: Payer: Self-pay

## 2023-12-01 ENCOUNTER — Other Ambulatory Visit (HOSPITAL_COMMUNITY): Payer: Self-pay

## 2023-12-01 DIAGNOSIS — I10 Essential (primary) hypertension: Secondary | ICD-10-CM

## 2023-12-01 MED ORDER — VALSARTAN 40 MG PO TABS
40.0000 mg | ORAL_TABLET | Freq: Every day | ORAL | 3 refills | Status: AC
  Filled 2023-12-01 – 2023-12-03 (×2): qty 90, 90d supply, fill #0
  Filled 2024-02-27: qty 90, 90d supply, fill #1
  Filled 2024-03-05: qty 90, 90d supply, fill #0

## 2023-12-01 NOTE — Telephone Encounter (Signed)
 [facility] Patient Advocate Encounter  Patient's medications Apretude  have been couriered to [facility] from [facility] and will be administered at the patients appointment on 12/05/23.  Arland Hutchinson, CPhT Specialty Pharmacy Patient [facility] Phone: [phone] Fax:  [phone]

## 2023-12-02 ENCOUNTER — Other Ambulatory Visit: Payer: Self-pay

## 2023-12-03 ENCOUNTER — Other Ambulatory Visit: Payer: Self-pay

## 2023-12-03 ENCOUNTER — Other Ambulatory Visit: Payer: Self-pay | Admitting: Family Medicine

## 2023-12-03 ENCOUNTER — Other Ambulatory Visit (HOSPITAL_COMMUNITY): Payer: Self-pay

## 2023-12-03 MED ORDER — DEXCOM G7 SENSOR MISC
6 refills | Status: AC
  Filled 2023-12-03: qty 3, fill #0
  Filled 2023-12-03: qty 3, 30d supply, fill #0
  Filled 2023-12-30: qty 3, 30d supply, fill #1
  Filled 2024-01-23: qty 3, 30d supply, fill #2
  Filled 2024-02-27: qty 3, 30d supply, fill #3

## 2023-12-03 NOTE — Progress Notes (Signed)
 HPI: Bruce Cabrera is a 58 y.o. male who presents to the [facility] pharmacy clinic for Apretude  administration and HIV PrEP follow up.  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 FOR BLOOD PRESSURE   ATORVASTATIN  (LIPITOR) 80 MG TABLET    Take 1 tablet (80 mg total) by mouth daily.   AZATHIOPRINE  (IMURAN ) 50 MG TABLET    Take 1 tablet (50 mg total) by mouth daily.   AZATHIOPRINE  (IMURAN ) 50 MG TABLET    Take 1 tablet (50 mg total) by mouth daily.   AZATHIOPRINE  (IMURAN ) 50 MG TABLET    Take 2 tablets (100 mg total) by mouth daily.   BUPROPION  (WELLBUTRIN  XL) 150 MG 24 HR TABLET    Take 1 tablet (150 mg total) by mouth daily.   CABOTEGRAVIR  ER (APRETUDE ) 600 MG/3ML INJECTION    Inject 3 mLs (600 mg total) into the muscle every 2 (two) months.   CONTINUOUS GLUCOSE RECEIVER (DEXCOM G7 RECEIVER) DEVI    Use to check blood glucose continuously.   CONTINUOUS GLUCOSE SENSOR (DEXCOM G7 SENSOR) MISC    Use to check blood glucose throughout the day. Changes sensors once every 10 days.   DAPAGLIFLOZIN  PROPANEDIOL (FARXIGA ) 10 MG TABS TABLET    Take 1 tablet (10 mg total) by mouth daily before breakfast.   DORZOLAMIDE -TIMOLOL  (COSOPT ) 2-0.5 % OPHTHALMIC SOLUTION    Place 1 drop into both eyes 2 (two) times daily.   DORZOLAMIDE -TIMOLOL  (COSOPT ) 2-0.5 % OPHTHALMIC  SOLUTION    Place 1 drop into both eyes 2 (two) times daily.   DORZOLAMIDE -TIMOLOL  (COSOPT ) 2-0.5 % OPHTHALMIC SOLUTION    Place 1 drop into both eyes 2 (two) times daily.   DORZOLAMIDE -TIMOLOL  (COSOPT ) 2-0.5 % OPHTHALMIC SOLUTION    Place 1 drop into both eyes in the morning and at bedtime.   DORZOLAMIDE -TIMOLOL  (COSOPT ) 2-0.5 % OPHTHALMIC SOLUTION    Place 1 drop into both eyes 2 (two) times daily.   DOXYCYCLINE  (VIBRA -TABS) 100 MG TABLET    Take 2 tablets (200 mg total) by mouth See admin instructions. Within 72 hours of sexual contact   INSULIN  GLARGINE, 1 UNIT DIAL , (TOUJEO ) 300 UNIT/ML SOLOSTAR PEN    Inject 40 Units into the skin at bedtime.   LATANOPROST  (XALATAN ) 0.005 % OPHTHALMIC SOLUTION    Place 1 drop into both eyes at bedtime.   SEMAGLUTIDE , 2 MG/DOSE, (OZEMPIC , 2 MG/DOSE,) 8 MG/3ML SOPN    Inject 2 mg as directed once a week.   VALSARTAN  (DIOVAN ) 40 MG TABLET    Take 1 tablet (40 mg total) by mouth daily.   VITAMIN D , ERGOCALCIFEROL , (DRISDOL ) 1.25 MG (50000 UNIT) CAPS CAPSULE    Take 1 capsule (50,000 Units total) by mouth once a week.  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) 05/09/2022    Social History: Social History   Socioeconomic History   Marital status: Single    Spouse name: Not on file   Number of children: Not on file    Years of education: Not on file   Highest education level: Not on file  Occupational History   Occupation: Science writer at [employer]  Tobacco Use   Smoking status: Some Days    Current packs/day: 0.50    Average packs/day: 0.5 packs/day for 30.0 years (15.0 ttl pk-yrs)    Types: Cigarettes   Smokeless tobacco: Never  Vaping Use   Vaping status: Never Used  Substance and Sexual Activity   Alcohol use: Yes    Comment: 2- tallboys a day   Drug use: No   Sexual activity: Not on file  Other Topics Concern   Not on file  Social History Narrative   Lives with roommate   Social Drivers of Health   Financial Resource Strain: High Risk (01/07/2023)   Overall Financial Resource Strain (CARDIA)    Difficulty of Paying Living Expenses: Very hard  Food Insecurity: Food Insecurity Present (04/17/2023)   Hunger Vital Sign    Worried About Running Out of Food in the Last Year: Sometimes true    Ran Out of Food in the Last Year: Never true  Transportation Needs: No Transportation Needs (04/17/2023)   PRAPARE - Administrator, Civil Service (Medical): No    Lack of Transportation (Non-Medical): No  Physical Activity: Inactive (01/07/2023)   Exercise Vital Sign    Days of Exercise per Week: 0 days    Minutes of Exercise per Session: 0 min  Stress: Stress Concern Present (01/07/2023)   [employer] of Occupational Health - Occupational Stress Questionnaire    Feeling of Stress : Very much  Social Connections: Socially Isolated (01/07/2023)   Social Connection and Isolation Panel    Frequency of Communication with Friends and Family: Once a week    Frequency of Social Gatherings with Friends and Family: Once a week    Attends Religious Services: Never    Database administrator or Organizations: No    Attends Engineer, structural: Never    Marital Status: Never married    Labs: Lab Results  Component Value Date   HIV1RNAQUANT NOT DETECTED 09/30/2023   HIV1RNAQUANT  NOT DETECTED 07/31/2023   HIV1RNAQUANT NOT DETECTED 06/03/2023    RPR and STI Lab Results  Component Value Date   LABRPR Reactive (A) 04/10/2023   LABRPR NON REAC 01/11/2008   LABRPR NON REAC 01/27/2007        No data to display          Hepatitis B Lab Results  Component Value Date   HEPBSAB REACTIVE (A) 07/31/2023   HEPBSAG NON-REACTIVE 05/01/2023   HEPBCAB NON-REACTIVE 05/01/2023   Hepatitis C Lab  Results  Component Value Date   HEPCAB NON-REACTIVE 05/01/2023   Hepatitis A Lab Results  Component Value Date   HAV NON-REACTIVE 05/01/2023   Lipids: Lab Results  Component Value Date   CHOL 141 12/19/2022   TRIG 98 12/19/2022   HDL 37 (L) 12/19/2022   CHOLHDL 3.8 12/19/2022   VLDL 20 11/25/2013   LDLCALC 86 12/19/2022    TARGET DATE: The 19th of the month  Assessment: Odel presents today for their Apretude  injection and to follow up for HIV PrEP. No issues with past injections.  Screened patient for acute HIV symptoms such as fatigue, muscle aches, rash, sore throat, lymphadenopathy, headache, night sweats, nausea/vomiting/diarrhea, and fever. Patient denies any symptoms.   Per [employer] guidelines, a rapid HIV test should be drawn prior to Apretude  administration. Due to state shortage of rapid HIV tests, this is temporarily unable to be done. Per decision from [facility] physicians, we will proceed with Apretude  administration at this time without a negative rapid HIV test beforehand. HIV RNA was collected today and is in process.  Administered cabotegravir  600mg /54mL in right upper outer quadrant of the gluteal muscle. Will make follow up appointments for maintenance injections every 2 months.   No known exposures to any STIs and no signs or symptoms of any STIs today. Last STI screening was in February and was negative. No new partners since last injection; politely declines STI testing today.   Eligible for 2/2 HAV, flu, COVID, and Shingles vaccines; accepts  2/2 HAV vaccine. Will reassess HAV immunity at next appointment.    Discussed Yeztugo for PrEP. Counseled patient that they will need to complete an oral loading dose. Counseled that patient will take two Sunlenca 300 mg tablets (600 mg total) orally on the first day of their injection and will take two Sunlenca 300 mg tablets (600 mg total) orally the next day regardless of meals. Counseled patient that Sunlenca is two separate subcutaneous injections in the abdomen every 6 months. Reviewed that the main side effects are injection-site soreness and nodules. Discussed measures for relief including cold packs and over-the-counter anti-inflammatories.  Would like to continue with Apretude  at this time but will research [facility] for the future.   Plan: - Administer Apretude  600 mg x 1  - Maintenance injections scheduled for 12/12 with me  - Check HIV RNA and RPR  - Administer 2/2 HAV vaccine  - Call with any issues or questions  Alan Geralds, PharmD, CPP, BCIDP, AAHIVP Clinical Pharmacist Practitioner Infectious Diseases Clinical Pharmacist [facility]

## 2023-12-05 ENCOUNTER — Other Ambulatory Visit: Payer: Self-pay

## 2023-12-05 ENCOUNTER — Ambulatory Visit: Admitting: Pharmacist

## 2023-12-05 DIAGNOSIS — Z2981 Encounter for HIV pre-exposure prophylaxis: Secondary | ICD-10-CM

## 2023-12-05 DIAGNOSIS — Z23 Encounter for immunization: Secondary | ICD-10-CM | POA: Diagnosis not present

## 2023-12-05 DIAGNOSIS — Z113 Encounter for screening for infections with a predominantly sexual mode of transmission: Secondary | ICD-10-CM

## 2023-12-05 MED ORDER — CABOTEGRAVIR ER 600 MG/3ML IM SUER
600.0000 mg | Freq: Once | INTRAMUSCULAR | Status: AC
  Administered 2023-12-05: 600 mg via INTRAMUSCULAR

## 2023-12-05 NOTE — Patient Instructions (Signed)
 New PrEP Medication to Research = Yeztugo  - Two 1.5 mL subcutaneous injections - Administered in clinic every 6 months - Must take loading doses (2 pills each day) on day 1 and day 2 after first injection  - Main side effect is injection site pain, soreness, and nodules  - Nodules can last for months but tend to improve after each injection  - We still need to check an HIV test at every visit  Feel free to call if you have any questions before your next visit [phone])!    Alan Geralds, PharmD, CPP, BCIDP, AAHIVP Clinical Pharmacist Practitioner Infectious Diseases Clinical Pharmacist [facility]

## 2023-12-08 ENCOUNTER — Other Ambulatory Visit (HOSPITAL_COMMUNITY): Payer: Self-pay

## 2023-12-09 LAB — HIV-1 RNA QUANT-NO REFLEX-BLD
HIV 1 RNA Quant: NOT DETECTED {copies}/mL
HIV-1 RNA Quant, Log: NOT DETECTED {Log_copies}/mL

## 2023-12-11 ENCOUNTER — Other Ambulatory Visit (HOSPITAL_COMMUNITY): Payer: Self-pay

## 2023-12-17 ENCOUNTER — Other Ambulatory Visit: Payer: Self-pay

## 2023-12-17 ENCOUNTER — Other Ambulatory Visit (HOSPITAL_COMMUNITY): Payer: Self-pay

## 2023-12-24 ENCOUNTER — Other Ambulatory Visit: Payer: Self-pay

## 2023-12-24 ENCOUNTER — Other Ambulatory Visit: Payer: Self-pay | Admitting: Nurse Practitioner

## 2023-12-24 ENCOUNTER — Other Ambulatory Visit (HOSPITAL_COMMUNITY): Payer: Self-pay

## 2023-12-24 MED ORDER — BUPROPION HCL ER (XL) 150 MG PO TB24
150.0000 mg | ORAL_TABLET | Freq: Every day | ORAL | 1 refills | Status: AC
  Filled 2023-12-24: qty 90, 90d supply, fill #0
  Filled 2024-01-23 – 2024-03-19 (×3): qty 90, 90d supply, fill #1

## 2023-12-29 ENCOUNTER — Other Ambulatory Visit (HOSPITAL_COMMUNITY): Payer: Self-pay

## 2023-12-30 ENCOUNTER — Other Ambulatory Visit: Payer: Self-pay

## 2023-12-31 ENCOUNTER — Encounter: Payer: Self-pay | Admitting: Podiatry

## 2023-12-31 ENCOUNTER — Ambulatory Visit (INDEPENDENT_AMBULATORY_CARE_PROVIDER_SITE_OTHER): Admitting: Podiatry

## 2023-12-31 ENCOUNTER — Ambulatory Visit (INDEPENDENT_AMBULATORY_CARE_PROVIDER_SITE_OTHER)

## 2023-12-31 VITALS — Ht 75.0 in | Wt 278.6 lb

## 2023-12-31 DIAGNOSIS — S92354D Nondisplaced fracture of fifth metatarsal bone, right foot, subsequent encounter for fracture with routine healing: Secondary | ICD-10-CM | POA: Diagnosis not present

## 2023-12-31 NOTE — Progress Notes (Signed)
 Chief Complaint  Patient presents with   Foot Injury    Pt is here to f/u on right foot due to fracture, he states everything is going well, pain at a minimal, has no other complaints.    Subjective:  Patient presents for follow-up evaluation of fracture to the right fifth metatarsal  Brief history: He states that while he was working this past Friday, 10/03/2023, he stepped down on his right foot and noticed a 'pop'  With immediate pain.  It has been painful over the weekend.  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) 05/09/2022    Past Surgical History:  Procedure Laterality Date   COLONOSCOPY     COLONOSCOPY W/ POLYPECTOMY  03/21/2023   FOOT SURGERY Left    LEFT HEART CATHETERIZATION WITH CORONARY ANGIOGRAM N/A 11/29/2013   Procedure: LEFT HEART CATHETERIZATION WITH CORONARY ANGIOGRAM;  Surgeon: Debby DELENA Sor, MD;  Location: [facility] CATH LAB;  Service: Cardiovascular;  Laterality: N/A;    Allergies  Allergen Reactions   Trulicity  [Dulaglutide ] Other (See Comments)    Abdominal pain    LT foot 05/19/2023  Objective/Physical Exam Preulcerative callus lesions noted to the lateral aspect of the left foot around the fifth metatarsal tubercle as well as the plantar aspect of the first MTP right foot.  No appreciable tenderness or edema around the fifth metatarsal tubercle  Radiographic Exam RT foot 12/31/2023:  Absence of the fibular sesamoid noted.  There appears to be some improvement and slow routine healing of the transverse fracture noted at the fifth metatarsal tubercle  with minimal displacement.  Impression: fracture fifth metatarsal tubercle left foot  MR TOES RIGHT WO CONTRAST 01/11/2023 IMPRESSION: 1. Mild flexor hallucis longus tenosynovitis both proximal to and distal to the first MTP joint. 2. Mildly thickened and indistinct adductor hallucis tendon, compatible with tendinopathy 3. Absent lateral sesamoid of the first digit. Bifid medial sesamoid of the first digit without internal edema signal. 4. Small dorsal effusion of the first MTP joint. 5. Mild degenerative findings along the interphalangeal joint of the great toe.  Assessment: 1. s/p sesamoidectomy right. DOS: 08/29/2022 2.  Preulcerative callus lesions first MTP right and fifth metatarsal tubercle left 3.  Acute fracture fifth metatarsal tubercle right.  DOI: 10/03/2023  Plan of Care:  -Patient was evaluated.  - Excisional debridement of the hyperkeratotic preulcerative callus lesions was performed today using a 312 scalpel without incident or bleeding. -In regards to the acute fracture of the fifth metatarsal tubercle, patient is doing very well.  He has returned to work full activity with no restrictions and minimal pain and tenderness -Continue good supportive tennis shoes and sneakers.  Refrain from going barefoot -Return to clinic 3 months for follow-up x-ray  *Works at the [employer]  Thresa EMERSON Sar, DPM [facility]  Dr. Thresa EMERSON Sar, DPM    [address]  [place], [place] [address]                Office [phone]  Fax [phone]

## 2024-01-06 ENCOUNTER — Other Ambulatory Visit (HOSPITAL_COMMUNITY): Payer: Self-pay

## 2024-01-06 ENCOUNTER — Other Ambulatory Visit: Payer: Self-pay

## 2024-01-14 ENCOUNTER — Other Ambulatory Visit: Payer: Self-pay

## 2024-01-14 ENCOUNTER — Other Ambulatory Visit (HOSPITAL_COMMUNITY): Payer: Self-pay

## 2024-01-16 ENCOUNTER — Other Ambulatory Visit (HOSPITAL_COMMUNITY): Payer: Self-pay

## 2024-01-16 ENCOUNTER — Other Ambulatory Visit: Payer: Self-pay

## 2024-01-16 NOTE — Progress Notes (Signed)
 Specialty Pharmacy Refill Coordination Note  Bruce Cabrera is a 58 y.o. male assessed today regarding refills of clinic administered specialty medication(s) Cabotegravir  (Apretude )   Clinic requested Courier to Provider Office   Delivery date: 01/26/24   Verified address: [address] [place] [address]   Medication will be filled on 01/23/24.

## 2024-01-19 ENCOUNTER — Other Ambulatory Visit (HOSPITAL_COMMUNITY): Payer: Self-pay

## 2024-01-19 ENCOUNTER — Other Ambulatory Visit: Payer: Self-pay

## 2024-01-23 ENCOUNTER — Other Ambulatory Visit: Payer: Self-pay

## 2024-01-26 ENCOUNTER — Telehealth: Payer: Self-pay

## 2024-01-26 NOTE — Telephone Encounter (Signed)
 [facility] Patient Advocate Encounter  Patient's medications Apretude  have been couriered to [facility] from [facility] and will be administered at the patients appointment on 01/30/24.  Arland Hutchinson, CPhT Specialty Pharmacy Patient [facility] Phone: [phone] Fax:  [phone]

## 2024-01-27 ENCOUNTER — Other Ambulatory Visit: Payer: Self-pay

## 2024-01-27 ENCOUNTER — Other Ambulatory Visit (HOSPITAL_COMMUNITY): Payer: Self-pay

## 2024-01-27 DIAGNOSIS — A523 Neurosyphilis, unspecified: Secondary | ICD-10-CM | POA: Diagnosis not present

## 2024-01-27 DIAGNOSIS — H3581 Retinal edema: Secondary | ICD-10-CM | POA: Diagnosis not present

## 2024-01-27 DIAGNOSIS — H30033 Focal chorioretinal inflammation, peripheral, bilateral: Secondary | ICD-10-CM | POA: Diagnosis not present

## 2024-01-27 DIAGNOSIS — E119 Type 2 diabetes mellitus without complications: Secondary | ICD-10-CM | POA: Diagnosis not present

## 2024-01-27 MED ORDER — AZATHIOPRINE 50 MG PO TABS
100.0000 mg | ORAL_TABLET | Freq: Every day | ORAL | 3 refills | Status: AC
  Filled 2024-01-27 – 2024-03-05 (×2): qty 180, 90d supply, fill #0

## 2024-01-27 MED ORDER — LATANOPROST 0.005 % OP SOLN: 1.0000 [drp] | Freq: Every day | OPHTHALMIC | 11 refills | Status: AC

## 2024-01-27 MED ORDER — DORZOLAMIDE HCL-TIMOLOL MAL 2-0.5 % OP SOLN
1.0000 [drp] | Freq: Two times a day (BID) | OPHTHALMIC | 11 refills | Status: AC
  Filled 2024-01-27: qty 10, 50d supply, fill #0
  Filled 2024-03-13: qty 10, 50d supply, fill #1
  Filled 2024-03-19: qty 10, 50d supply, fill #0

## 2024-01-27 NOTE — Progress Notes (Deleted)
 HPI: Bruce Cabrera is a 58 y.o. male who presents to the [facility] pharmacy clinic for Apretude  administration and HIV PrEP follow up.  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 FOR BLOOD PRESSURE   ATORVASTATIN  (LIPITOR) 80 MG TABLET    Take 1 tablet (80 mg total) by mouth daily.   AZATHIOPRINE  (IMURAN ) 50 MG TABLET    Take 2 tablets (100 mg total) by mouth daily.   AZATHIOPRINE  (IMURAN ) 50 MG TABLET    Take 2 tablets (100 mg total) by mouth daily.   BUPROPION  (WELLBUTRIN  XL) 150 MG 24 HR TABLET    Take 1 tablet (150 mg total) by mouth daily.   CABOTEGRAVIR  ER (APRETUDE ) 600 MG/3ML INJECTION    Inject 3 mLs (600 mg total) into the muscle every 2 (two) months.   CONTINUOUS GLUCOSE RECEIVER (DEXCOM G7 RECEIVER) DEVI    Use to check blood glucose continuously.   CONTINUOUS GLUCOSE SENSOR (DEXCOM G7 SENSOR) MISC    Use to check blood glucose throughout the day. Changes sensors once every 10 days.   DAPAGLIFLOZIN  PROPANEDIOL (FARXIGA ) 10 MG TABS TABLET    Take 1 tablet (10 mg total) by mouth daily before breakfast.   DORZOLAMIDE -TIMOLOL  (COSOPT ) 2-0.5 % OPHTHALMIC SOLUTION    Place 1 drop into both eyes 2 (two) times daily.   DORZOLAMIDE -TIMOLOL  (COSOPT ) 2-0.5 % OPHTHALMIC SOLUTION    Place 1 drop into both eyes 2 (two)  times daily.   DORZOLAMIDE -TIMOLOL  (COSOPT ) 2-0.5 % OPHTHALMIC SOLUTION    Place 1 drop into both eyes 2 (two) times daily.   DORZOLAMIDE -TIMOLOL  (COSOPT ) 2-0.5 % OPHTHALMIC SOLUTION    Place 1 drop into both eyes 2 (two) times daily.   DOXYCYCLINE  (VIBRA -TABS) 100 MG TABLET    Take 2 tablets (200 mg total) by mouth See admin instructions. Within 72 hours of sexual contact   INSULIN  GLARGINE, 1 UNIT DIAL , (TOUJEO ) 300 UNIT/ML SOLOSTAR PEN    Inject 40 Units into the skin at bedtime.   LATANOPROST  (XALATAN ) 0.005 % OPHTHALMIC SOLUTION    Place 1 drop into both eyes at bedtime.   LATANOPROST  (XALATAN ) 0.005 % OPHTHALMIC SOLUTION    Place 1 drop into both eyes at bedtime.   SEMAGLUTIDE , 2 MG/DOSE, (OZEMPIC , 2 MG/DOSE,) 8 MG/3ML SOPN    Inject 2 mg as directed once a week.   VALSARTAN  (DIOVAN ) 40 MG TABLET    Take 1 tablet (40 mg total) by mouth daily.   VITAMIN D , ERGOCALCIFEROL , (DRISDOL ) 1.25 MG (50000 UNIT) CAPS CAPSULE    Take 1 capsule (50,000 Units total) by mouth once a week.  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) 05/09/2022    Social History: Social History   Socioeconomic History   Marital status: Single    Spouse name: Not on file   Number of children: Not on file   Years of education: Not on file   Highest education level: Not on file   Occupational History   Occupation: Science Writer at [employer]  Tobacco Use   Smoking status: Some Days    Current packs/day: 0.50    Average packs/day: 0.5 packs/day for 30.0 years (15.0 ttl pk-yrs)    Types: Cigarettes   Smokeless tobacco: Never  Vaping Use   Vaping status: Never Used  Substance and Sexual Activity   Alcohol use: Yes    Comment: 2- tallboys a day   Drug use: No   Sexual activity: Not on file  Other Topics Concern   Not on file  Social History Narrative   Lives with roommate   Social Drivers of Health   Financial Resource Strain: High Risk (01/07/2023)   Overall Financial Resource Strain (CARDIA)    Difficulty of Paying Living Expenses: Very hard  Food Insecurity: Food Insecurity Present (04/17/2023)   Hunger Vital Sign    Worried About Running Out of Food in the Last Year: Sometimes true    Ran Out of Food in the Last Year: Never true  Transportation Needs: No Transportation Needs (04/17/2023)   PRAPARE - Administrator, Civil Service (Medical): No    Lack of Transportation (Non-Medical): No  Physical Activity: Inactive (01/07/2023)   Exercise Vital Sign    Days of Exercise per Week: 0 days    Minutes of Exercise per Session: 0 min  Stress: Stress Concern Present (01/07/2023)   [employer] of Occupational Health - Occupational Stress Questionnaire    Feeling of Stress : Very much  Social Connections: Socially Isolated (01/07/2023)   Social Connection and Isolation Panel    Frequency of Communication with Friends and Family: Once a week    Frequency of Social Gatherings with Friends and Family: Once a week    Attends Religious Services: Never    Database Administrator or Organizations: No    Attends Engineer, Structural: Never    Marital Status: Never married    Labs: Lab Results  Component Value Date   HIV1RNAQUANT NOT DETECTED 12/05/2023   HIV1RNAQUANT NOT DETECTED 09/30/2023   HIV1RNAQUANT NOT DETECTED 07/31/2023     RPR and STI Lab Results  Component Value Date   LABRPR Reactive (A) 04/10/2023   LABRPR NON REAC 01/11/2008   LABRPR NON REAC 01/27/2007        No data to display          Hepatitis B Lab Results  Component Value Date   HEPBSAB REACTIVE (A) 07/31/2023   HEPBSAG NON-REACTIVE 05/01/2023   HEPBCAB NON-REACTIVE 05/01/2023   Hepatitis C Lab Results  Component Value Date   HEPCAB NON-REACTIVE 05/01/2023  Hepatitis A Lab Results  Component Value Date   HAV NON-REACTIVE 05/01/2023   Lipids: Lab Results  Component Value Date   CHOL 141 12/19/2022   TRIG 98 12/19/2022   HDL 37 (L) 12/19/2022   CHOLHDL 3.8 12/19/2022   VLDL 20 11/25/2013   LDLCALC 86 12/19/2022    TARGET DATE: The 19th of the month  Assessment: Bruce Cabrera presents today for their Apretude  injection and to follow up for HIV PrEP. No issues with past injections.  Screened patient for acute HIV symptoms such as fatigue, muscle aches, rash, sore throat, lymphadenopathy, headache, night sweats, nausea/vomiting/diarrhea, and fever. Patient denies any symptoms.   Administered cabotegravir  600mg /63mL in *** upper outer quadrant of the gluteal muscle. Will make follow up appointments for maintenance injections every 2 months.   No known exposures to any STIs and no signs or symptoms of any STIs today. Attempted to recheck RPR at last visit without success; will repeat today as has been > 6 months since treatment in February. Initial titer was 1:32.   Eligible for flu, COVID, and Shingles vaccines which he continues to decline.   Plan: - Administer Apretude  600 mg x 1  - Maintenance injections scheduled for *** - Check HIV RNA and RPR  - Call with any issues or questions  Alan Geralds, PharmD, CPP, BCIDP, AAHIVP Clinical Pharmacist Practitioner Infectious Diseases Clinical Pharmacist [facility]

## 2024-01-28 ENCOUNTER — Ambulatory Visit: Attending: Nurse Practitioner | Admitting: Nurse Practitioner

## 2024-01-28 ENCOUNTER — Encounter: Payer: Self-pay | Admitting: Nurse Practitioner

## 2024-01-28 VITALS — BP 136/86 | HR 78 | Resp 19 | Ht 75.0 in | Wt 291.4 lb

## 2024-01-28 DIAGNOSIS — E119 Type 2 diabetes mellitus without complications: Secondary | ICD-10-CM | POA: Diagnosis not present

## 2024-01-28 DIAGNOSIS — I10 Essential (primary) hypertension: Secondary | ICD-10-CM | POA: Diagnosis not present

## 2024-01-28 DIAGNOSIS — E1169 Type 2 diabetes mellitus with other specified complication: Secondary | ICD-10-CM | POA: Diagnosis not present

## 2024-01-28 DIAGNOSIS — Z7984 Long term (current) use of oral hypoglycemic drugs: Secondary | ICD-10-CM

## 2024-01-28 DIAGNOSIS — Z794 Long term (current) use of insulin: Secondary | ICD-10-CM | POA: Diagnosis not present

## 2024-01-28 DIAGNOSIS — E785 Hyperlipidemia, unspecified: Secondary | ICD-10-CM | POA: Diagnosis not present

## 2024-01-28 LAB — POCT GLYCOSYLATED HEMOGLOBIN (HGB A1C): HbA1c, POC (controlled diabetic range): 6.9 % (ref 0.0–7.0)

## 2024-01-28 NOTE — Progress Notes (Signed)
 Assessment & Plan:  Bruce Cabrera was seen today for diabetes.  Diagnoses and all orders for this visit:  Diabetes mellitus treated with insulin  and oral medication  Instructed to administer toujeo  daily as prescribed. Do not skip doses -     POCT glycosylated hemoglobin (Hb A1C) Continue blood sugar control as discussed in office today, low carbohydrate diet, and regular physical exercise as tolerated, 150 minutes per week (30 min each day, 5 days per week, or 50 min 3 days per week). Keep blood sugar logs with fasting goal of 90-130 mg/dl, post prandial (after you eat) less than 180.  For Hypoglycemia: BS <60 and Hyperglycemia BS >400; contact the clinic ASAP. Annual eye exams and foot exams are recommended.   Hyperlipidemia due to type 2 diabetes mellitus (HCC) -     Urine Albumin/Creatinine with ratio (send out) [LAB689] INSTRUCTIONS: Work on a low fat, heart healthy diet and participate in regular aerobic exercise program by working out at least 150 minutes per week; 5 days a week-30 minutes per day. Avoid red meat/beef/steak,  fried foods. junk foods, sodas, sugary drinks, unhealthy snacking, alcohol and smoking.  Drink at least 80 oz of water per day and monitor your carbohydrate intake daily.    Primary hypertension Continue all antihypertensives as prescribed.  Reminded to bring in blood pressure log for follow  up appointment.  RECOMMENDATIONS: DASH/Mediterranean Diets are healthier choices for HTN.     Patient has been counseled on age-appropriate routine health concerns for screening and prevention. These are reviewed and up-to-date. Referrals have been placed accordingly. Immunizations are up-to-date or declined.    Subjective:   Chief Complaint  Patient presents with   Diabetes    Bruce Cabrera 58 y.o. male presents to office today for for follow up to DM  PMH: DM, HTN, Neurosyphilis, Peripheral focal chorioretinal inflammation of both eyes.   DM 2 A1c slightly increased  from 6.6 to 6.9 He has not been administering Toujeo  daily as prescribed due to labile blood glucose readings from 90-160. He is administering ozempic  2 mg weekly as prescribed and taking farxiga  10 mg daily.  Lab Results  Component Value Date   HGBA1C 6.9 01/28/2024    Lab Results  Component Value Date   HGBA1C 6.6 01/28/2024     HTN Blood pressure is well controlled with valsartan  and amlodipine  BP Readings from Last 3 Encounters:  01/28/24 136/86  10/29/23 (!) 149/86  07/29/23 (!) 153/83      Stopped taking wellbutrin  as he did not feel it was effective. Declines starting any new antidepressants today. States depression is mostly situational due to current financial status    04/24/2023   12:59 PM 05/09/2022   10:29 AM 02/27/2022    9:57 AM  Depression screen PHQ 2/9  Decreased Interest 0 2 2  Down, Depressed, Hopeless 0 2 0  PHQ - 2 Score 0 4 2  Altered sleeping  2 0  Tired, decreased energy  1 0  Change in appetite   2  Feeling bad or failure about yourself    0  Trouble concentrating   2  Moving slowly or fidgety/restless   0  Suicidal thoughts  0 0  PHQ-9 Score  7  6      Data saved with a previous flowsheet row definition    Review of Systems  Constitutional:  Negative for fever, malaise/fatigue and weight loss.  HENT: Negative.  Negative for nosebleeds.   Eyes: Negative.  Negative  for blurred vision, double vision and photophobia.  Respiratory: Negative.  Negative for cough and shortness of breath.   Cardiovascular: Negative.  Negative for chest pain, palpitations and leg swelling.  Gastrointestinal: Negative.  Negative for heartburn, nausea and vomiting.  Musculoskeletal: Negative.  Negative for myalgias.  Neurological: Negative.  Negative for dizziness, focal weakness, seizures and headaches.  Psychiatric/Behavioral: Negative.  Negative for suicidal ideas.     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) 05/09/2022    Past Surgical History:  Procedure Laterality Date   COLONOSCOPY     COLONOSCOPY W/ POLYPECTOMY  03/21/2023   FOOT SURGERY Left    LEFT HEART CATHETERIZATION WITH CORONARY ANGIOGRAM N/A 11/29/2013   Procedure: LEFT HEART CATHETERIZATION WITH CORONARY ANGIOGRAM;  Surgeon: Debby DELENA Sor, MD;  Location: [facility] CATH LAB;  Service: Cardiovascular;  Laterality: N/A;    Family History  Problem Relation Age of Onset   Heart attack Mother 73   Colon polyps Sister    Colon cancer Sister    Stroke Sister    Heart failure Brother 40   Rectal cancer Neg Hx    Stomach cancer Neg Hx    Esophageal cancer Neg Hx     Social History Reviewed with no changes to be made today.   Outpatient Medications Prior to Visit  Medication Sig Dispense Refill   amLODipine  (NORVASC ) 10 MG tablet Take 1 tablet (10 mg total) by mouth daily. FOR BLOOD PRESSURE 90 tablet 1   atorvastatin  (LIPITOR) 80 MG tablet Take 1 tablet (80 mg total) by mouth daily. 90 tablet 3   azaTHIOprine  (IMURAN ) 50 MG tablet Take 2 tablets (100 mg total) by mouth daily. 180 tablet 3   buPROPion  (WELLBUTRIN  XL) 150 MG 24 hr tablet Take 1 tablet (150 mg total) by mouth daily. 90 tablet 1   cabotegravir  ER (APRETUDE ) 600 MG/3ML injection Inject 3 mLs (600 mg total) into the muscle every 2 (two) months. 3 mL 5   Continuous Glucose Sensor (DEXCOM G7 SENSOR) MISC Use to check blood glucose throughout the day. Changes sensors once every 10 days. 3 each 6   dapagliflozin  propanediol (FARXIGA ) 10 MG TABS tablet Take 1 tablet (10 mg total) by mouth daily before breakfast. 30 tablet 4    dorzolamide -timolol  (COSOPT ) 2-0.5 % ophthalmic solution Place 1 drop into both eyes 2 (two) times daily. 10 mL 11   doxycycline  (VIBRA -TABS) 100 MG tablet Take 2 tablets (200 mg total) by mouth See admin instructions. Within 72 hours of sexual contact 20 tablet 0   insulin  glargine, 1 Unit Dial , (TOUJEO ) 300 UNIT/ML Solostar Pen Inject 40 Units into the skin at bedtime. 4.5 mL 4   latanoprost  (XALATAN ) 0.005 % ophthalmic solution Place 1 drop into both eyes at bedtime. 7.5 mL 11   Semaglutide , 2 MG/DOSE, (OZEMPIC , 2 MG/DOSE,) 8 MG/3ML SOPN Inject 2 mg as directed once a week. 3 mL 3   valsartan  (DIOVAN ) 40 MG tablet Take 1 tablet (40 mg total) by mouth daily. 90 tablet 3   azaTHIOprine  (IMURAN ) 50 MG tablet Take 2 tablets (  100 mg total) by mouth daily. (Patient not taking: Reported on 01/28/2024) 180 tablet 3   Continuous Glucose Receiver (DEXCOM G7 RECEIVER) DEVI Use to check blood glucose continuously. (Patient not taking: Reported on 01/28/2024) 1 each 0   dorzolamide -timolol  (COSOPT ) 2-0.5 % ophthalmic solution Place 1 drop into both eyes 2 (two) times daily. (Patient not taking: Reported on 01/28/2024) 10 mL 11   dorzolamide -timolol  (COSOPT ) 2-0.5 % ophthalmic solution Place 1 drop into both eyes 2 (two) times daily. (Patient not taking: Reported on 01/28/2024) 10 mL 11   dorzolamide -timolol  (COSOPT ) 2-0.5 % ophthalmic solution Place 1 drop into both eyes 2 (two) times daily. (Patient not taking: Reported on 01/28/2024) 10 mL 11   latanoprost  (XALATAN ) 0.005 % ophthalmic solution Place 1 drop into both eyes at bedtime. (Patient not taking: Reported on 01/28/2024) 7.5 mL 11   Vitamin D , Ergocalciferol , (DRISDOL ) 1.25 MG (50000 UNIT) CAPS capsule Take 1 capsule (50,000 Units total) by mouth once a week. (Patient not taking: Reported on 01/28/2024) 12 capsule 0   No facility-administered medications prior to visit.    Allergies  Allergen Reactions   Trulicity  [Dulaglutide ] Other (See Comments)     Abdominal pain       Objective:    BP 136/86 (BP Location: Left Arm, Patient Position: Sitting, Cuff Size: Normal)   Pulse 78   Resp 19   Ht 6' 3 (1.905 m)   Wt 291 lb 6.4 oz (132.2 kg)   SpO2 98%   BMI 36.42 kg/m  Wt Readings from Last 3 Encounters:  01/28/24 291 lb 6.4 oz (132.2 kg)  12/31/23 278 lb 9.6 oz (126.4 kg)  10/29/23 278 lb 9.6 oz (126.4 kg)    Physical Exam Vitals and nursing note reviewed.  Constitutional:      Appearance: He is well-developed.  HENT:     Head: Normocephalic and atraumatic.  Cardiovascular:     Rate and Rhythm: Normal rate and regular rhythm.     Heart sounds: Normal heart sounds. No murmur heard.    No friction rub. No gallop.  Pulmonary:     Effort: Pulmonary effort is normal. No tachypnea or respiratory distress.     Breath sounds: Normal breath sounds. No decreased breath sounds, wheezing, rhonchi or rales.  Chest:     Chest wall: No tenderness.  Musculoskeletal:        General: Normal range of motion.     Cervical back: Normal range of motion.  Skin:    General: Skin is warm and dry.  Neurological:     Mental Status: He is alert and oriented to person, place, and time.     Coordination: Coordination normal.  Psychiatric:        Behavior: Behavior normal. Behavior is cooperative.        Thought Content: Thought content normal.        Judgment: Judgment normal.          Patient has been counseled extensively about nutrition and exercise as well as the importance of adherence with medications and regular follow-up. The patient was given clear instructions to go to ER or return to medical center if symptoms don't improve, worsen or new problems develop. The patient verbalized understanding.   Follow-up: Return in about 3 months (around 04/27/2024) for HTN.   Haze LELON Servant, FNP-BC [facility] [place], [place] [phone]   01/28/2024, 12:01 PM

## 2024-01-29 ENCOUNTER — Other Ambulatory Visit (HOSPITAL_COMMUNITY): Payer: Self-pay

## 2024-01-29 ENCOUNTER — Telehealth: Payer: Self-pay

## 2024-01-29 LAB — MICROALBUMIN / CREATININE URINE RATIO
Creatinine, Urine: 66.7 mg/dL
Microalb/Creat Ratio: 760 mg/g{creat} — ABNORMAL HIGH (ref 0–29)
Microalbumin, Urine: 506.8 ug/mL

## 2024-01-29 NOTE — Telephone Encounter (Signed)
 Pharmacy Patient Advocate Encounter  Insurance verification completed.   The patient is insured through [employer] for [employer]. Currently a quantity of 4 is a 15 day supply and will need a  PA .   This test claim was processed through [facility]- copay amounts may vary at other pharmacies due to pharmacy/plan contracts, or as the patient moves through the different stages of their insurance plan.

## 2024-01-30 ENCOUNTER — Ambulatory Visit: Admitting: Pharmacist

## 2024-01-30 ENCOUNTER — Encounter: Payer: Self-pay | Admitting: Pharmacist

## 2024-01-30 DIAGNOSIS — Z113 Encounter for screening for infections with a predominantly sexual mode of transmission: Secondary | ICD-10-CM

## 2024-01-30 DIAGNOSIS — Z2981 Encounter for HIV pre-exposure prophylaxis: Secondary | ICD-10-CM

## 2024-01-30 NOTE — Progress Notes (Signed)
 HPI: Bruce Cabrera is a 58 y.o. male who presents to the [facility] pharmacy clinic for Apretude  administration and HIV PrEP follow up.  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 FOR BLOOD PRESSURE   ATORVASTATIN  (LIPITOR) 80 MG TABLET    Take 1 tablet (80 mg total) by mouth daily.   AZATHIOPRINE  (IMURAN ) 50 MG TABLET    Take 2 tablets (100 mg total) by mouth daily.   AZATHIOPRINE  (IMURAN ) 50 MG TABLET    Take 2 tablets (100 mg total) by mouth daily.   BUPROPION  (WELLBUTRIN  XL) 150 MG 24 HR TABLET    Take 1 tablet (150 mg total) by mouth daily.   CABOTEGRAVIR  ER (APRETUDE ) 600 MG/3ML INJECTION    Inject 3 mLs (600 mg total) into the muscle every 2 (two) months.   CONTINUOUS GLUCOSE RECEIVER (DEXCOM G7 RECEIVER) DEVI    Use to check blood glucose continuously.   CONTINUOUS GLUCOSE SENSOR (DEXCOM G7 SENSOR) MISC    Use to check blood glucose throughout the day. Changes sensors once every 10 days.   DAPAGLIFLOZIN  PROPANEDIOL (FARXIGA ) 10 MG TABS TABLET    Take 1 tablet (10 mg total) by mouth daily before breakfast.   DORZOLAMIDE -TIMOLOL  (COSOPT ) 2-0.5 % OPHTHALMIC SOLUTION    Place 1 drop into both eyes 2 (two) times daily.   DORZOLAMIDE -TIMOLOL  (COSOPT ) 2-0.5 % OPHTHALMIC SOLUTION    Place 1 drop into both eyes 2 (two)  times daily.   DORZOLAMIDE -TIMOLOL  (COSOPT ) 2-0.5 % OPHTHALMIC SOLUTION    Place 1 drop into both eyes 2 (two) times daily.   DORZOLAMIDE -TIMOLOL  (COSOPT ) 2-0.5 % OPHTHALMIC SOLUTION    Place 1 drop into both eyes 2 (two) times daily.   DOXYCYCLINE  (VIBRA -TABS) 100 MG TABLET    Take 2 tablets (200 mg total) by mouth See admin instructions. Within 72 hours of sexual contact   INSULIN  GLARGINE, 1 UNIT DIAL , (TOUJEO ) 300 UNIT/ML SOLOSTAR PEN    Inject 40 Units into the skin at bedtime.   LATANOPROST  (XALATAN ) 0.005 % OPHTHALMIC SOLUTION    Place 1 drop into both eyes at bedtime.   LATANOPROST  (XALATAN ) 0.005 % OPHTHALMIC SOLUTION    Place 1 drop into both eyes at bedtime.   SEMAGLUTIDE , 2 MG/DOSE, (OZEMPIC , 2 MG/DOSE,) 8 MG/3ML SOPN    Inject 2 mg as directed once a week.   VALSARTAN  (DIOVAN ) 40 MG TABLET    Take 1 tablet (40 mg total) by mouth daily.   VITAMIN D , ERGOCALCIFEROL , (DRISDOL ) 1.25 MG (50000 UNIT) CAPS CAPSULE    Take 1 capsule (50,000 Units total) by mouth once a week.  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) 05/09/2022    Social History: Social History   Socioeconomic History   Marital status: Single    Spouse name: Not on file   Number of children: Not on file   Years of education: Not on file   Highest education level: Not on file   Occupational History   Occupation: Science Writer at [employer]  Tobacco Use   Smoking status: Some Days    Current packs/day: 0.50    Average packs/day: 0.5 packs/day for 30.0 years (15.0 ttl pk-yrs)    Types: Cigarettes   Smokeless tobacco: Never  Vaping Use   Vaping status: Never Used  Substance and Sexual Activity   Alcohol use: Yes    Comment: 2- tallboys a day   Drug use: No   Sexual activity: Not on file  Other Topics Concern   Not on file  Social History Narrative   Lives with roommate   Social Drivers of Health   Tobacco Use: High Risk (01/28/2024)   Patient History    Smoking Tobacco Use: Some Days    Smokeless Tobacco Use: Never    Passive Exposure: Not on file  Financial Resource Strain: High Risk (01/07/2023)   Overall Financial Resource Strain (CARDIA)    Difficulty of Paying Living Expenses: Very hard  Food Insecurity: Food Insecurity Present (04/17/2023)   Hunger Vital Sign    Worried About Running Out of Food in the Last Year: Sometimes true    Ran Out of Food in the Last Year: Never true  Transportation Needs: No Transportation Needs (04/17/2023)   PRAPARE - Administrator, Civil Service (Medical): No    Lack of Transportation (Non-Medical): No  Physical Activity: Inactive (01/07/2023)   Exercise Vital Sign    Days of Exercise per Week: 0 days    Minutes of Exercise per Session: 0 min  Stress: Stress Concern Present (01/07/2023)   [employer] of Occupational Health - Occupational Stress Questionnaire    Feeling of Stress : Very much  Social Connections: Socially Isolated (01/07/2023)   Social Connection and Isolation Panel    Frequency of Communication with Friends and Family: Once a week    Frequency of Social Gatherings with Friends and Family: Once a week    Attends Religious Services: Never    Database Administrator or Organizations: No    Attends Banker Meetings: Never    Marital Status: Never married  Depression  (PHQ2-9): Low Risk (04/24/2023)   Depression (PHQ2-9)    PHQ-2 Score: 0  Alcohol Screen: Low Risk (01/07/2023)   Alcohol Screen    Last Alcohol Screening Score (AUDIT): 1  Housing: Low Risk (04/17/2023)   Housing Stability Vital Sign    Unable to Pay for Housing in the Last Year: No    Number of Times Moved in the Last Year: 1    Homeless in the Last Year: No  Recent Concern: Housing -  Medium Risk (01/21/2023)   Housing    Last Housing Risk Score: 1  Utilities: Not At Risk (04/17/2023)   AHC Utilities    Threatened with loss of utilities: No  Health Literacy: Inadequate Health Literacy (01/07/2023)   B1300 Health Literacy    Frequency of need for help with medical instructions: Often    Labs: Lab Results  Component Value Date   HIV1RNAQUANT NOT DETECTED 12/05/2023   HIV1RNAQUANT NOT DETECTED 09/30/2023   HIV1RNAQUANT NOT DETECTED 07/31/2023    RPR and STI Lab Results  Component Value Date   LABRPR Reactive (A) 04/10/2023   LABRPR NON REAC 01/11/2008   LABRPR NON REAC 01/27/2007        No data to display          Hepatitis B Lab Results  Component Value Date   HEPBSAB REACTIVE (A) 07/31/2023   HEPBSAG NON-REACTIVE 05/01/2023   HEPBCAB NON-REACTIVE 05/01/2023   Hepatitis C Lab Results  Component Value Date   HEPCAB NON-REACTIVE 05/01/2023   Hepatitis A Lab Results  Component Value Date   HAV NON-REACTIVE 05/01/2023   Lipids: Lab Results  Component Value Date   CHOL 141 12/19/2022   TRIG 98 12/19/2022   HDL 37 (L) 12/19/2022   CHOLHDL 3.8 12/19/2022   VLDL 20 11/25/2013   LDLCALC 86 12/19/2022    TARGET DATE: The 19th of the month  Assessment: Zhaire presents today for their Apretude  injection and to follow up for HIV PrEP. No issues with past injections.  Screened patient for acute HIV symptoms such as fatigue, muscle aches, rash, sore throat, lymphadenopathy, headache, night sweats, nausea/vomiting/diarrhea, and fever. Patient denies any symptoms.    Administered cabotegravir  600mg /22mL in right upper outer quadrant of the gluteal muscle. Will make follow up appointments for maintenance injections every 2 months.   No known exposures to any STIs and no signs or symptoms of any STIs today. Attempted to recheck RPR at last visit without success; will repeat today as has been > 6 months since treatment in February. Initial titer was 1:32. Requests doxyPEP refill today.   Eligible for flu, COVID, and Shingles vaccines which he continues to decline.   Plan: - Administer Apretude  600 mg x 1  - Maintenance injections scheduled for 2/20 with me  - Check HIV RNA and RPR - Refill doxyPEP  - Call with any issues or questions  Alan Geralds, PharmD, CPP, BCIDP, AAHIVP Clinical Pharmacist Practitioner Infectious Diseases Clinical Pharmacist [facility]

## 2024-02-04 ENCOUNTER — Other Ambulatory Visit: Payer: Self-pay

## 2024-02-04 ENCOUNTER — Other Ambulatory Visit (HOSPITAL_COMMUNITY): Payer: Self-pay

## 2024-02-04 ENCOUNTER — Ambulatory Visit: Admitting: Pharmacist

## 2024-02-04 DIAGNOSIS — Z2981 Encounter for HIV pre-exposure prophylaxis: Secondary | ICD-10-CM

## 2024-02-04 DIAGNOSIS — Z113 Encounter for screening for infections with a predominantly sexual mode of transmission: Secondary | ICD-10-CM

## 2024-02-04 MED ORDER — CABOTEGRAVIR ER 600 MG/3ML IM SUER
600.0000 mg | Freq: Once | INTRAMUSCULAR | Status: AC
  Administered 2024-02-04: 10:00:00 600 mg via INTRAMUSCULAR

## 2024-02-04 MED ORDER — DOXYCYCLINE HYCLATE 100 MG PO TABS
ORAL_TABLET | ORAL | 2 refills | Status: AC
  Filled 2024-02-04 (×2): qty 60, 30d supply, fill #0

## 2024-02-06 ENCOUNTER — Telehealth: Payer: Self-pay

## 2024-02-06 ENCOUNTER — Other Ambulatory Visit (HOSPITAL_BASED_OUTPATIENT_CLINIC_OR_DEPARTMENT_OTHER): Payer: Self-pay

## 2024-02-06 NOTE — Telephone Encounter (Signed)
 Patient called stating he received a message about his results, but didn't understand. Advised that it doesn't look like anyone from our office reached out and that we are still waiting on most of his labs to come back. Notified him that provider would reach out once all his labs were back.   Bruce Cabrera, BSN, RN

## 2024-02-07 ENCOUNTER — Ambulatory Visit: Payer: Self-pay | Admitting: Nurse Practitioner

## 2024-02-07 LAB — T PALLIDUM AB: T Pallidum Abs: POSITIVE — AB

## 2024-02-07 LAB — HIV-1 RNA QUANT-NO REFLEX-BLD
HIV 1 RNA Quant: NOT DETECTED {copies}/mL
HIV-1 RNA Quant, Log: NOT DETECTED {Log_copies}/mL

## 2024-02-07 LAB — RPR TITER: RPR Titer: 1:2 {titer} — ABNORMAL HIGH

## 2024-02-07 LAB — SYPHILIS: RPR W/REFLEX TO RPR TITER AND TREPONEMAL ANTIBODIES, TRADITIONAL SCREENING AND DIAGNOSIS ALGORITHM: RPR Ser Ql: REACTIVE — AB

## 2024-02-26 ENCOUNTER — Other Ambulatory Visit (HOSPITAL_COMMUNITY): Payer: Self-pay

## 2024-02-26 ENCOUNTER — Other Ambulatory Visit: Payer: Self-pay

## 2024-02-26 ENCOUNTER — Other Ambulatory Visit: Payer: Self-pay | Admitting: Family Medicine

## 2024-02-26 DIAGNOSIS — E1165 Type 2 diabetes mellitus with hyperglycemia: Secondary | ICD-10-CM

## 2024-02-26 MED ORDER — DAPAGLIFLOZIN PROPANEDIOL 10 MG PO TABS
10.0000 mg | ORAL_TABLET | Freq: Every day | ORAL | 4 refills | Status: AC
  Filled 2024-02-26 – 2024-02-27 (×2): qty 30, 30d supply, fill #0

## 2024-02-27 ENCOUNTER — Other Ambulatory Visit (HOSPITAL_COMMUNITY): Payer: Self-pay

## 2024-02-27 ENCOUNTER — Other Ambulatory Visit: Payer: Self-pay

## 2024-03-01 ENCOUNTER — Other Ambulatory Visit (HOSPITAL_COMMUNITY): Payer: Self-pay

## 2024-03-01 ENCOUNTER — Other Ambulatory Visit: Payer: Self-pay

## 2024-03-02 ENCOUNTER — Other Ambulatory Visit: Payer: Self-pay

## 2024-03-05 ENCOUNTER — Other Ambulatory Visit: Payer: Self-pay

## 2024-03-19 ENCOUNTER — Other Ambulatory Visit (HOSPITAL_COMMUNITY): Payer: Self-pay

## 2024-03-19 ENCOUNTER — Other Ambulatory Visit: Payer: Self-pay

## 2024-03-20 ENCOUNTER — Other Ambulatory Visit (HOSPITAL_COMMUNITY): Payer: Self-pay

## 2024-03-22 ENCOUNTER — Other Ambulatory Visit: Payer: Self-pay

## 2024-03-22 ENCOUNTER — Other Ambulatory Visit (HOSPITAL_COMMUNITY): Payer: Self-pay

## 2024-03-25 ENCOUNTER — Other Ambulatory Visit: Payer: Self-pay

## 2024-03-31 ENCOUNTER — Ambulatory Visit: Admitting: Podiatry

## 2024-04-09 ENCOUNTER — Ambulatory Visit: Payer: Self-pay | Admitting: Pharmacist

## 2024-04-27 ENCOUNTER — Ambulatory Visit: Admitting: Nurse Practitioner
